# Patient Record
Sex: Male | Born: 1960 | Race: White | Hispanic: No | Marital: Single | Smoking: Former smoker
Health system: Southern US, Community
[De-identification: ages and names within clinical notes are randomized; demographics above are authoritative.]

## PROBLEM LIST (undated history)

## (undated) DIAGNOSIS — C259 Malignant neoplasm of pancreas, unspecified: Secondary | ICD-10-CM

## (undated) DIAGNOSIS — Q600 Renal agenesis, unilateral: Secondary | ICD-10-CM

## (undated) DIAGNOSIS — Z9981 Dependence on supplemental oxygen: Secondary | ICD-10-CM

## (undated) DIAGNOSIS — G4733 Obstructive sleep apnea (adult) (pediatric): Secondary | ICD-10-CM

## (undated) DIAGNOSIS — M1990 Unspecified osteoarthritis, unspecified site: Secondary | ICD-10-CM

## (undated) DIAGNOSIS — K219 Gastro-esophageal reflux disease without esophagitis: Secondary | ICD-10-CM

## (undated) DIAGNOSIS — J449 Chronic obstructive pulmonary disease, unspecified: Secondary | ICD-10-CM

## (undated) DIAGNOSIS — Z9989 Dependence on other enabling machines and devices: Secondary | ICD-10-CM

## (undated) HISTORY — PX: COLONOSCOPY: SHX174

## (undated) HISTORY — PX: ESOPHAGOGASTRODUODENOSCOPY: SHX1529

---

## 2000-06-15 HISTORY — PX: KNEE ARTHROSCOPY: SUR90

## 2013-06-29 ENCOUNTER — Ambulatory Visit: Payer: Self-pay

## 2015-08-27 DIAGNOSIS — R5383 Other fatigue: Secondary | ICD-10-CM | POA: Insufficient documentation

## 2015-08-27 DIAGNOSIS — R7309 Other abnormal glucose: Secondary | ICD-10-CM

## 2015-08-27 DIAGNOSIS — J449 Chronic obstructive pulmonary disease, unspecified: Secondary | ICD-10-CM

## 2015-08-27 DIAGNOSIS — N401 Enlarged prostate with lower urinary tract symptoms: Secondary | ICD-10-CM | POA: Insufficient documentation

## 2015-08-27 DIAGNOSIS — R5381 Other malaise: Secondary | ICD-10-CM | POA: Insufficient documentation

## 2015-08-27 HISTORY — DX: Other malaise: R53.83

## 2015-08-27 HISTORY — DX: Chronic obstructive pulmonary disease, unspecified: J44.9

## 2015-08-27 HISTORY — DX: Benign prostatic hyperplasia with lower urinary tract symptoms: N40.1

## 2015-08-27 HISTORY — DX: Other malaise: R53.81

## 2015-08-27 HISTORY — DX: Other abnormal glucose: R73.09

## 2015-09-10 DIAGNOSIS — E041 Nontoxic single thyroid nodule: Secondary | ICD-10-CM | POA: Insufficient documentation

## 2015-09-10 DIAGNOSIS — F17200 Nicotine dependence, unspecified, uncomplicated: Secondary | ICD-10-CM | POA: Insufficient documentation

## 2015-09-10 DIAGNOSIS — E7800 Pure hypercholesterolemia, unspecified: Secondary | ICD-10-CM

## 2015-09-10 DIAGNOSIS — R739 Hyperglycemia, unspecified: Secondary | ICD-10-CM | POA: Insufficient documentation

## 2015-09-10 HISTORY — DX: Hyperglycemia, unspecified: R73.9

## 2015-09-10 HISTORY — DX: Pure hypercholesterolemia, unspecified: E78.00

## 2015-09-10 HISTORY — DX: Nontoxic single thyroid nodule: E04.1

## 2017-12-28 DIAGNOSIS — M25562 Pain in left knee: Secondary | ICD-10-CM | POA: Insufficient documentation

## 2017-12-28 HISTORY — DX: Pain in left knee: M25.562

## 2017-12-30 ENCOUNTER — Encounter (HOSPITAL_COMMUNITY): Payer: Self-pay | Admitting: *Deleted

## 2017-12-30 ENCOUNTER — Other Ambulatory Visit: Payer: Self-pay

## 2017-12-31 ENCOUNTER — Ambulatory Visit: Payer: Self-pay | Admitting: Orthopedic Surgery

## 2017-12-31 NOTE — Progress Notes (Signed)
Clearance on chart- 12/28/2017- Dr Venetia Maxon - on chart

## 2017-12-31 NOTE — Progress Notes (Signed)
Need orders in epic.  Surgery on 01/06/2018.  Thank You.

## 2018-01-04 ENCOUNTER — Other Ambulatory Visit: Payer: Self-pay

## 2018-01-04 ENCOUNTER — Encounter (HOSPITAL_BASED_OUTPATIENT_CLINIC_OR_DEPARTMENT_OTHER): Payer: Self-pay | Admitting: *Deleted

## 2018-01-04 NOTE — Progress Notes (Addendum)
Spoke w/ pt via phone for pre-op interview.  Npo after mn w/ exception clear liquids until 0900 (no cream/milk products).  Arrive at 1300.  Will take prilosec and do stiolto and proair inhalers am dos w/ sips of water.  Will review chart w/ anesthesia since pt is prescribed oxygen as needed.  Request pt pulmologist, dr Alcide Clever, [place] note via fax.  Pt pcp clearance faxed via from dr swinteck office w/ chart   ADDENDUM;  Reviewed chart w/ dr rose mda, face to face.  Dr rose stated ok to proceed , have pt do his atrovent nebulizer as prescribed qid and bring cpap.  ADDENDUM:  Called and spoke w/ pt via phone.  Pt verbalized understanding to do atrovent four times daily from now until am dos and bring cpap.  ADDENDUM:  Called and spoke w/ pt via phone.  Pt verbalized understanding his surgery start time is now at 1300 and to arrive dos at 1100.  Also understood clear liquids are from midnight Wednesday until [place] , then nothing by mouth.  Was unable to received pulmologist note , pt was going to have to sign.

## 2018-01-06 ENCOUNTER — Encounter (HOSPITAL_BASED_OUTPATIENT_CLINIC_OR_DEPARTMENT_OTHER)
Admission: RE | Disposition: A | Discharge status: Home / Self Care | Payer: Self-pay | Source: Ambulatory Visit | Attending: Orthopedic Surgery

## 2018-01-06 ENCOUNTER — Ambulatory Visit (HOSPITAL_COMMUNITY): Payer: Medicaid Other | Admitting: Anesthesiology

## 2018-01-06 ENCOUNTER — Ambulatory Visit (HOSPITAL_BASED_OUTPATIENT_CLINIC_OR_DEPARTMENT_OTHER)
Admission: RE | Admit: 2018-01-06 | Discharge: 2018-01-06 | Disposition: A | Discharge status: Home / Self Care | Payer: Medicaid Other | Source: Ambulatory Visit | Attending: Orthopedic Surgery | Admitting: Orthopedic Surgery

## 2018-01-06 ENCOUNTER — Encounter (HOSPITAL_BASED_OUTPATIENT_CLINIC_OR_DEPARTMENT_OTHER): Payer: Self-pay | Admitting: *Deleted

## 2018-01-06 DIAGNOSIS — S83232A Complex tear of medial meniscus, current injury, left knee, initial encounter: Secondary | ICD-10-CM | POA: Diagnosis not present

## 2018-01-06 DIAGNOSIS — Z9981 Dependence on supplemental oxygen: Secondary | ICD-10-CM | POA: Insufficient documentation

## 2018-01-06 DIAGNOSIS — Y92002 Bathroom of unspecified non-institutional (private) residence single-family (private) house as the place of occurrence of the external cause: Secondary | ICD-10-CM | POA: Diagnosis not present

## 2018-01-06 DIAGNOSIS — F17210 Nicotine dependence, cigarettes, uncomplicated: Secondary | ICD-10-CM | POA: Diagnosis not present

## 2018-01-06 DIAGNOSIS — K219 Gastro-esophageal reflux disease without esophagitis: Secondary | ICD-10-CM | POA: Insufficient documentation

## 2018-01-06 DIAGNOSIS — W19XXXA Unspecified fall, initial encounter: Secondary | ICD-10-CM | POA: Insufficient documentation

## 2018-01-06 DIAGNOSIS — Y93E1 Activity, personal bathing and showering: Secondary | ICD-10-CM | POA: Diagnosis not present

## 2018-01-06 DIAGNOSIS — Z8507 Personal history of malignant neoplasm of pancreas: Secondary | ICD-10-CM | POA: Insufficient documentation

## 2018-01-06 DIAGNOSIS — M1712 Unilateral primary osteoarthritis, left knee: Secondary | ICD-10-CM | POA: Insufficient documentation

## 2018-01-06 DIAGNOSIS — Z79899 Other long term (current) drug therapy: Secondary | ICD-10-CM | POA: Insufficient documentation

## 2018-01-06 DIAGNOSIS — Q600 Renal agenesis, unilateral: Secondary | ICD-10-CM | POA: Diagnosis not present

## 2018-01-06 DIAGNOSIS — G4733 Obstructive sleep apnea (adult) (pediatric): Secondary | ICD-10-CM | POA: Insufficient documentation

## 2018-01-06 DIAGNOSIS — X501XXA Overexertion from prolonged static or awkward postures, initial encounter: Secondary | ICD-10-CM | POA: Diagnosis not present

## 2018-01-06 DIAGNOSIS — J449 Chronic obstructive pulmonary disease, unspecified: Secondary | ICD-10-CM | POA: Insufficient documentation

## 2018-01-06 HISTORY — DX: Malignant neoplasm of pancreas, unspecified: C25.9

## 2018-01-06 HISTORY — PX: KNEE ARTHROSCOPY WITH MEDIAL MENISECTOMY: SHX5651

## 2018-01-06 HISTORY — DX: Obstructive sleep apnea (adult) (pediatric): G47.33

## 2018-01-06 HISTORY — DX: Unspecified osteoarthritis, unspecified site: M19.90

## 2018-01-06 HISTORY — DX: Dependence on supplemental oxygen: Z99.81

## 2018-01-06 HISTORY — DX: Dependence on other enabling machines and devices: Z99.89

## 2018-01-06 HISTORY — DX: Gastro-esophageal reflux disease without esophagitis: K21.9

## 2018-01-06 HISTORY — DX: Chronic obstructive pulmonary disease, unspecified: J44.9

## 2018-01-06 HISTORY — DX: Renal agenesis, unilateral: Q60.0

## 2018-01-06 SURGERY — ARTHROSCOPY, KNEE, WITH MEDIAL MENISCECTOMY
Anesthesia: General | Site: Knee | Laterality: Left

## 2018-01-06 MED ORDER — ONDANSETRON HCL 4 MG/2ML IJ SOLN
INTRAMUSCULAR | Status: DC | PRN
  Administered 2018-01-06: 4 mg via INTRAVENOUS

## 2018-01-06 MED ORDER — DEXAMETHASONE SODIUM PHOSPHATE 10 MG/ML IJ SOLN
INTRAMUSCULAR | Status: AC
  Filled 2018-01-06: qty 1

## 2018-01-06 MED ORDER — HYDROMORPHONE HCL 1 MG/ML IJ SOLN
0.5000 mg | INTRAMUSCULAR | Status: DC | PRN
  Filled 2018-01-06: qty 1

## 2018-01-06 MED ORDER — CHLORHEXIDINE GLUCONATE 4 % EX LIQD
60.0000 mL | Freq: Once | CUTANEOUS | Status: DC
  Filled 2018-01-06: qty 118

## 2018-01-06 MED ORDER — LIDOCAINE-EPINEPHRINE 1 %-1:100000 IJ SOLN
INTRAMUSCULAR | Status: AC
  Filled 2018-01-06: qty 1

## 2018-01-06 MED ORDER — PROPOFOL 10 MG/ML IV BOLUS
INTRAVENOUS | Status: DC | PRN
  Administered 2018-01-06 (×2): 20 mg via INTRAVENOUS
  Administered 2018-01-06: 140 mg via INTRAVENOUS

## 2018-01-06 MED ORDER — BUPIVACAINE-EPINEPHRINE (PF) 0.25% -1:200000 IJ SOLN
INTRAMUSCULAR | Status: AC
  Filled 2018-01-06: qty 30

## 2018-01-06 MED ORDER — HYDROCODONE-ACETAMINOPHEN 5-325 MG PO TABS: 1.0000 | ORAL_TABLET | Freq: Four times a day (QID) | ORAL | 0 refills | Status: DC | PRN

## 2018-01-06 MED ORDER — FENTANYL CITRATE (PF) 100 MCG/2ML IJ SOLN
INTRAMUSCULAR | Status: AC
  Filled 2018-01-06: qty 2

## 2018-01-06 MED ORDER — DEXAMETHASONE SODIUM PHOSPHATE 10 MG/ML IJ SOLN
INTRAMUSCULAR | Status: DC | PRN
  Administered 2018-01-06: 10 mg via INTRAVENOUS

## 2018-01-06 MED ORDER — ASPIRIN EC 325 MG PO TBEC: 325.0000 mg | DELAYED_RELEASE_TABLET | Freq: Two times a day (BID) | ORAL | 0 refills | Status: DC

## 2018-01-06 MED ORDER — ONDANSETRON HCL 4 MG PO TABS: 4.0000 mg | ORAL_TABLET | Freq: Three times a day (TID) | ORAL | 0 refills | Status: DC | PRN

## 2018-01-06 MED ORDER — DEXTROSE 5 % IV SOLN
3.0000 g | INTRAVENOUS | Status: AC
  Administered 2018-01-06: 3 g via INTRAVENOUS
  Filled 2018-01-06 (×2): qty 3000

## 2018-01-06 MED ORDER — OXYCODONE HCL 5 MG PO TABS
5.0000 mg | ORAL_TABLET | ORAL | Status: DC | PRN
  Filled 2018-01-06: qty 2

## 2018-01-06 MED ORDER — FENTANYL CITRATE (PF) 100 MCG/2ML IJ SOLN
INTRAMUSCULAR | Status: DC | PRN
  Administered 2018-01-06: 25 ug via INTRAVENOUS
  Administered 2018-01-06: 50 ug via INTRAVENOUS
  Administered 2018-01-06 (×3): 25 ug via INTRAVENOUS
  Administered 2018-01-06: 50 ug via INTRAVENOUS

## 2018-01-06 MED ORDER — LACTATED RINGERS IV SOLN
INTRAVENOUS | Status: DC | PRN
  Administered 2018-01-06: 13:00:00 via INTRAVENOUS

## 2018-01-06 MED ORDER — OXYCODONE HCL 5 MG/5ML PO SOLN
5.0000 mg | Freq: Once | ORAL | Status: AC | PRN
  Filled 2018-01-06: qty 5

## 2018-01-06 MED ORDER — SODIUM CHLORIDE 0.9 % IV SOLN
INTRAVENOUS | Status: DC
  Filled 2018-01-06: qty 1000

## 2018-01-06 MED ORDER — METOCLOPRAMIDE HCL 5 MG PO TABS
5.0000 mg | ORAL_TABLET | Freq: Three times a day (TID) | ORAL | Status: DC | PRN
  Filled 2018-01-06: qty 2

## 2018-01-06 MED ORDER — SODIUM CHLORIDE 0.9 % IR SOLN
Status: DC | PRN
  Administered 2018-01-06: 6000 mL

## 2018-01-06 MED ORDER — MIDAZOLAM HCL 2 MG/2ML IJ SOLN
1.0000 mg | Freq: Once | INTRAMUSCULAR | Status: AC
Start: 2018-01-06 — End: 2018-01-06
  Administered 2018-01-06: 1 mg via INTRAVENOUS
  Filled 2018-01-06: qty 1

## 2018-01-06 MED ORDER — METHOCARBAMOL 500 MG PO TABS
500.0000 mg | ORAL_TABLET | Freq: Four times a day (QID) | ORAL | Status: DC | PRN
  Filled 2018-01-06: qty 1

## 2018-01-06 MED ORDER — SODIUM CHLORIDE 0.9 % IV SOLN
INTRAVENOUS | Status: DC
  Administered 2018-01-06: 13:00:00 via INTRAVENOUS
  Filled 2018-01-06: qty 1000

## 2018-01-06 MED ORDER — LIDOCAINE-EPINEPHRINE 1 %-1:100000 IJ SOLN
INTRAMUSCULAR | Status: DC | PRN
  Administered 2018-01-06: 20 mL

## 2018-01-06 MED ORDER — OXYCODONE HCL 5 MG PO TABS
5.0000 mg | ORAL_TABLET | Freq: Once | ORAL | Status: AC | PRN
  Administered 2018-01-06: 5 mg via ORAL
  Filled 2018-01-06: qty 1

## 2018-01-06 MED ORDER — OXYCODONE HCL 5 MG PO TABS
ORAL_TABLET | ORAL | Status: AC
  Filled 2018-01-06: qty 1

## 2018-01-06 MED ORDER — ONDANSETRON HCL 4 MG/2ML IJ SOLN
4.0000 mg | Freq: Four times a day (QID) | INTRAMUSCULAR | Status: DC | PRN
  Filled 2018-01-06: qty 2

## 2018-01-06 MED ORDER — ONDANSETRON HCL 4 MG PO TABS
4.0000 mg | ORAL_TABLET | Freq: Four times a day (QID) | ORAL | Status: DC | PRN
  Filled 2018-01-06: qty 1

## 2018-01-06 MED ORDER — FENTANYL CITRATE (PF) 100 MCG/2ML IJ SOLN
25.0000 ug | INTRAMUSCULAR | Status: DC | PRN
  Filled 2018-01-06: qty 1

## 2018-01-06 MED ORDER — ONDANSETRON HCL 4 MG/2ML IJ SOLN
4.0000 mg | Freq: Four times a day (QID) | INTRAMUSCULAR | Status: DC | PRN
Start: 2018-01-06 — End: 2018-01-06
  Filled 2018-01-06: qty 2

## 2018-01-06 MED ORDER — LIDOCAINE HCL (CARDIAC) PF 100 MG/5ML IV SOSY
PREFILLED_SYRINGE | INTRAVENOUS | Status: DC | PRN
  Administered 2018-01-06: 80 mg via INTRAVENOUS

## 2018-01-06 MED ORDER — MIDAZOLAM HCL 2 MG/2ML IJ SOLN
INTRAMUSCULAR | Status: AC
  Filled 2018-01-06: qty 2

## 2018-01-06 MED ORDER — DOCUSATE SODIUM 100 MG PO CAPS: 100.0000 mg | ORAL_CAPSULE | Freq: Two times a day (BID) | ORAL | 3 refills | Status: DC

## 2018-01-06 MED ORDER — LIDOCAINE 2% (20 MG/ML) 5 ML SYRINGE
INTRAMUSCULAR | Status: AC
  Filled 2018-01-06: qty 5

## 2018-01-06 MED ORDER — MIDAZOLAM HCL 2 MG/2ML IJ SOLN
INTRAMUSCULAR | Status: DC | PRN
  Administered 2018-01-06: 2 mg via INTRAVENOUS

## 2018-01-06 MED ORDER — METHOCARBAMOL 1000 MG/10ML IJ SOLN
500.0000 mg | Freq: Four times a day (QID) | INTRAVENOUS | Status: DC | PRN
  Filled 2018-01-06: qty 5

## 2018-01-06 MED ORDER — PROPOFOL 10 MG/ML IV BOLUS
INTRAVENOUS | Status: AC
  Filled 2018-01-06: qty 20

## 2018-01-06 MED ORDER — BUPIVACAINE-EPINEPHRINE 0.25% -1:200000 IJ SOLN
INTRAMUSCULAR | Status: DC | PRN
  Administered 2018-01-06: 30 mL

## 2018-01-06 MED ORDER — STERILE WATER FOR IRRIGATION IR SOLN
Status: DC | PRN
  Administered 2018-01-06: 500 mL

## 2018-01-06 MED ORDER — METOCLOPRAMIDE HCL 5 MG/ML IJ SOLN
5.0000 mg | Freq: Three times a day (TID) | INTRAMUSCULAR | Status: DC | PRN
  Filled 2018-01-06: qty 2

## 2018-01-06 MED ORDER — SENNA 8.6 MG PO TABS: 2.0000 | ORAL_TABLET | Freq: Every day | ORAL | 3 refills | Status: DC

## 2018-01-06 MED ORDER — ONDANSETRON HCL 4 MG/2ML IJ SOLN
INTRAMUSCULAR | Status: AC
  Filled 2018-01-06: qty 2

## 2018-01-06 MED ORDER — POVIDONE-IODINE 10 % EX SWAB
2.0000 "application " | Freq: Once | CUTANEOUS | Status: DC
  Filled 2018-01-06: qty 2

## 2018-01-06 SURGICAL SUPPLY — 51 items
BANDAGE ACE 4X5 VEL STRL LF (GAUZE/BANDAGES/DRESSINGS) ×3 IMPLANT
BANDAGE ACE 6X5 VEL STRL LF (GAUZE/BANDAGES/DRESSINGS) ×3 IMPLANT
BANDAGE ELASTIC 6 VELCRO ST LF (GAUZE/BANDAGES/DRESSINGS) ×3 IMPLANT
BLADE CUDA 4.2 (BLADE) IMPLANT
BLADE CUDA SHAVER 3.5 (BLADE) ×3 IMPLANT
BLADE GREAT WHITE 4.2 (BLADE) IMPLANT
BLADE GREAT WHITE 4.2MM (BLADE)
BLADE GREAT WHITE SHAVER 5.5 (BLADE) IMPLANT
BLADE GREAT WHITE SHAVER 5.5MM (BLADE)
BNDG COHESIVE 6X5 TAN NS LF (GAUZE/BANDAGES/DRESSINGS) IMPLANT
CHLORAPREP W/TINT 26ML (MISCELLANEOUS) ×3 IMPLANT
CUFF TOURNIQUET SINGLE 34IN LL (TOURNIQUET CUFF) ×3 IMPLANT
DRAPE ARTHROSCOPY W/POUCH 114 (DRAPES) ×3 IMPLANT
DRAPE U-SHAPE 47X51 STRL (DRAPES) ×3 IMPLANT
DRSG EMULSION OIL 3X3 NADH (GAUZE/BANDAGES/DRESSINGS) ×3 IMPLANT
DRSG PAD ABDOMINAL 8X10 ST (GAUZE/BANDAGES/DRESSINGS) ×3 IMPLANT
GAUZE SPONGE 4X4 12PLY STRL (GAUZE/BANDAGES/DRESSINGS) ×3 IMPLANT
GAUZE SPONGE 4X4 12PLY STRL LF (GAUZE/BANDAGES/DRESSINGS) ×3 IMPLANT
GLOVE BIO SURGEON STRL SZ 6.5 (GLOVE) ×2 IMPLANT
GLOVE BIO SURGEON STRL SZ7 (GLOVE) ×3 IMPLANT
GLOVE BIO SURGEON STRL SZ8.5 (GLOVE) ×3 IMPLANT
GLOVE BIO SURGEONS STRL SZ 6.5 (GLOVE) ×1
GLOVE BIOGEL PI IND STRL 7.0 (GLOVE) ×1 IMPLANT
GLOVE BIOGEL PI INDICATOR 7.0 (GLOVE) ×2
GLOVE INDICATOR 8.5 STRL (GLOVE) ×3 IMPLANT
GOWN SPEC L3 XXLG W/TWL (GOWN DISPOSABLE) ×3 IMPLANT
GOWN STRL REUS W/ TWL LRG LVL3 (GOWN DISPOSABLE) ×1 IMPLANT
GOWN STRL REUS W/TWL 2XL LVL3 (GOWN DISPOSABLE) IMPLANT
GOWN STRL REUS W/TWL LRG LVL3 (GOWN DISPOSABLE) ×2
HOLDER KNEE FOAM BLUE (MISCELLANEOUS) IMPLANT
IV NS IRRIG 3000ML ARTHROMATIC (IV SOLUTION) ×6 IMPLANT
KIT TURNOVER CYSTO (KITS) ×3 IMPLANT
KNEE WRAP E Z 3 GEL PACK (MISCELLANEOUS) ×3 IMPLANT
MANIFOLD NEPTUNE II (INSTRUMENTS) ×3 IMPLANT
NDL SAFETY ECLIPSE 18X1.5 (NEEDLE) ×1 IMPLANT
NEEDLE HYPO 18GX1.5 SHARP (NEEDLE) ×2
PACK ARTHROSCOPY DSU (CUSTOM PROCEDURE TRAY) ×3 IMPLANT
PACK BASIN DAY SURGERY FS (CUSTOM PROCEDURE TRAY) ×3 IMPLANT
PADDING CAST COTTON 6X4 STRL (CAST SUPPLIES) ×3 IMPLANT
PROBE BIPOLAR 50 DEGREE SUCT (MISCELLANEOUS) IMPLANT
PROBE BIPOLAR ATHRO 135MM 90D (MISCELLANEOUS) IMPLANT
SET ARTHROSCOPY TUBING (MISCELLANEOUS) ×2
SET ARTHROSCOPY TUBING LN (MISCELLANEOUS) ×1 IMPLANT
SUT ETHILON 3 0 PS 1 (SUTURE) ×3 IMPLANT
SUT ETHILON 4 0 PS 2 18 (SUTURE) IMPLANT
SYR 30ML LL (SYRINGE) ×3 IMPLANT
TOWEL OR 17X24 6PK STRL BLUE (TOWEL DISPOSABLE) ×6 IMPLANT
TUBE CONNECTING 12'X1/4 (SUCTIONS) ×2
TUBE CONNECTING 12X1/4 (SUCTIONS) ×4 IMPLANT
WAND 30 DEG SABER W/CORD (SURGICAL WAND) IMPLANT
WATER STERILE IRR 500ML POUR (IV SOLUTION) ×3 IMPLANT

## 2018-01-06 NOTE — Anesthesia Preprocedure Evaluation (Signed)
Anesthesia Evaluation  Patient identified by MRN, date of birth, ID band Patient awake    Reviewed: Allergy & Precautions, H&P , NPO status , Patient's Chart, lab work & pertinent test results  Airway Mallampati: II   Neck ROM: full    Dental   Pulmonary sleep apnea and Oxygen sleep apnea , COPD, Current Smoker,    breath sounds clear to auscultation       Cardiovascular negative cardio ROS   Rhythm:regular Rate:Normal     Neuro/Psych    GI/Hepatic GERD  ,  Endo/Other  Morbid obesity  Renal/GU      Musculoskeletal  (+) Arthritis ,   Abdominal   Peds  Hematology   Anesthesia Other Findings   Reproductive/Obstetrics                             Anesthesia Physical Anesthesia Plan  ASA: III  Anesthesia Plan: General   Post-op Pain Management:    Induction: Intravenous  PONV Risk Score and Plan: 1 and Ondansetron, Dexamethasone, Midazolam and Treatment may vary due to age or medical condition  Airway Management Planned: LMA  Additional Equipment:   Intra-op Plan:   Post-operative Plan:   Informed Consent: I have reviewed the patients History and Physical, chart, labs and discussed the procedure including the risks, benefits and alternatives for the proposed anesthesia with the patient or authorized representative who has indicated his/her understanding and acceptance.     Plan Discussed with: CRNA, Anesthesiologist and Surgeon  Anesthesia Plan Comments:         Anesthesia Quick Evaluation

## 2018-01-06 NOTE — Op Note (Signed)
Preoperative diagnosis-  Left knee medial meniscal tear  Postoperative diagnosis Left- knee medial meniscal tear plus osteoarthritis  Procedure- Left knee arthroscopy with partial medial meniscectomy and medial compartment chondroplasty  Surgeon- Rod Can, MD  Anesthesia-General  EBL-  Minimal  Complications- None  Condition- PACU - hemodynamically stable.  Brief clinical note- Patient is a 57 year old male who fell in his bathtub and twisted his left knee.  After the injury, he had medial compartment pain and mechanical symptoms.  MRI showed a large medial meniscal tear.  Risks, benefits, and alternatives to arthroscopic evaluation of the knee and partial medial meniscectomy were explained, and he elected to proceed.  Procedure in detail -       After successful administration of General anesthetic, a tourmiquet is placed high on the Left  thigh and the Left lower extremity is prepped and draped in the usual sterile fashion. Time out is performed by the surgical team.  20 cc of 1% lidocaine with epinephrine were injected into the suprapatellar pouch of the knee.  Inferolateral portal site was created with an 11 blade.  Arthroscopic equipment was inserted, and inflow is initiated. Arthroscopic visualization proceeds.      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 The medial and lateral gutters are visualized and there are no loose bodies. Flexion and valgus force is applied to the knee and the medial compartment is entered. A spinal needle is passed into the joint through the site marked for the inferomedial portal. A small incision is made and the dilator passed into the joint. The findings for the medial compartment are diffuse grade II/III chondromalacia changes.  He has a large complex medial meniscal tear at the junction of the posterior horn and body.  He had radial and cleavage components.  The tear was unstable. The tear  is debrided to a stable base with baskets and a shaver. The shaver is used to debride the unstable cartilage to a stable cartilaginous base with stable edges. It is probed and found to be stable.    The intercondylar notch is visualized and the ACL appears normal. The lateral compartment is entered and the findings are intact meniscus with grade II chondromalacia.  The meniscus was probed and found to be stable.     The joint is again inspected and there are no other tears, defects or loose bodies identified. The arthroscopic equipment is then removed and the portals are closed with 3-0 nylon. 20 ml of .25% Marcaine with epinephrine are injected into the suprapatellar pouch of the knee.  The incisions are cleaned and dried and a bulky sterile dressing is applied. The patient is then awakened and transported to recovery in stable condition.   01/06/2018, 2:23 PM

## 2018-01-06 NOTE — H&P (Signed)
PREOPERATIVE H&P  Chief Complaint: Medial meniscal tear left knee  HPI: Taylor Vargas is a 57 y.o. male who presents for preoperative history and physical with a diagnosis of Medial meniscal tear left knee. Symptoms are rated as moderate to severe, and have been worsening.  This is significantly impairing activities of daily living.  He has elected for surgical management.   Past Medical History:  Diagnosis Date  . Arthritis   . Congenital absence of one kidney    01-04-2018 per pt unsure which one  . COPD (chronic obstructive pulmonary disease) ([place])    pulmologist-  dr Taylor Vargas)  . GERD (gastroesophageal reflux disease)   . On supplemental oxygen therapy    01-04-2018  per pt prescribed as needed ,  states checks oxygen level daily,  and average between 88-90% on room air,  per pt has not needed O2 past 4 months  . OSA on CPAP   . Pancreatic cancer ([place])    01-04-2018  per pt dx 10/ 2009 found by CT imaging, no surgery,  treatment chemotherapy  @ [facility],  in remission since 2017   Past Surgical History:  Procedure Laterality Date  . COLONOSCOPY  last one 2019  . KNEE ARTHROSCOPY Right 2002   Social History   Socioeconomic History  . Marital status: Single    Spouse name: Not on file  . Number of children: Not on file  . Years of education: Not on file  . Highest education level: Not on file  Occupational History  . Not on file  Social Needs  . Financial resource strain: Not on file  . Food insecurity:    Worry: Not on file    Inability: Not on file  . Transportation needs:    Medical: Not on file    Non-medical: Not on file  Tobacco Use  . Smoking status: Current Every Day Smoker    Years: 31.00    Types: Cigarettes  . Smokeless tobacco: Never Used  . Tobacco comment: 01-04-2018  per pt down from 1/2 ppd down to 3-4 cig daily  Substance and Sexual Activity  . Alcohol use: Yes    Frequency: Never    Comment: very rare  . Drug use: Never  . Sexual activity:  Not on file  Lifestyle  . Physical activity:    Days per week: Not on file    Minutes per session: Not on file  . Stress: Not on file  Relationships  . Social connections:    Talks on phone: Not on file    Gets together: Not on file    Attends religious service: Not on file    Active member of club or organization: Not on file    Attends meetings of clubs or organizations: Not on file    Relationship status: Not on file  Other Topics Concern  . Not on file  Social History Narrative  . Not on file   History reviewed. No pertinent family history. No Known Allergies Prior to Admission medications   Medication Sig Start Date End Date Taking? Authorizing Provider  albuterol (ACCUNEB) 0.63 MG/3ML nebulizer solution Take 1 ampule by nebulization every 6 (six) hours as needed for wheezing.   Yes [provider]  Albuterol Sulfate (PROAIR HFA IN) Inhale into the lungs.   Yes [provider]  Flaxseed, Linseed, (FLAX SEED OIL) 1000 MG CAPS Take by mouth daily.   Yes [provider]  ibuprofen (ADVIL,MOTRIN) 200 MG tablet Take 200 mg  by mouth every 6 (six) hours as needed.   Yes [provider]  ipratropium (ATROVENT) 0.02 % nebulizer solution Take 0.5 mg by nebulization 4 (four) times daily. Per pt only takes as needed   Yes [provider]  omeprazole (PRILOSEC) 40 MG capsule Take 40 mg by mouth every morning.    Yes [provider]  Tiotropium Bromide-Olodaterol (STIOLTO RESPIMAT) 2.5-2.5 MCG/ACT AERS Inhale into the lungs every morning.    Yes [provider]  vitamin C (ASCORBIC ACID) 500 MG tablet Take 500 mg by mouth daily.   Yes [provider]     Positive ROS: All other systems have been reviewed and were otherwise negative with the exception of those mentioned in the HPI and as above.  Physical Exam: General: Vargas, no acute distress Cardiovascular: No pedal edema Respiratory: No cyanosis, no use of  accessory musculature GI: No organomegaly, abdomen is soft and non-tender Skin: No lesions in the area of chief complaint Neurologic: Sensation intact distally Psychiatric: Patient is competent for consent with normal mood and affect Lymphatic: No axillary or cervical lymphadenopathy  MUSCULOSKELETAL: L knee swollen. + TTP medial joint line. + [place]. NVI.  Assessment: Medial meniscal tear left knee  Plan: Plan for Procedure(s): LEFT KNEE ARTHROSCOPY WITH MEDIAL MENISECTOMY  The risks benefits and alternatives were discussed with the patient including but not limited to the risks of nonoperative treatment, versus surgical intervention including infection, bleeding, nerve injury,  blood clots, cardiopulmonary complications, morbidity, mortality, among others, and they were willing to proceed.   Bertram Savin, MD Cell [phone]   01/06/2018 12:29 PM

## 2018-01-06 NOTE — Discharge Instructions (Signed)
° °Dr. Brian Swinteck °Hip & Knee Reconstruction °[facility] °[address] °[place], [place] [address] °[phone] ° ° °Arthroscopic Procedure, Knee °An arthroscopic procedure can find what is wrong with your knee. °PROCEDURE °Arthroscopy is a surgical technique that allows your orthopedic surgeon to diagnose and treat your knee injury with accuracy. They will look into your knee through a small instrument. This is almost like a small (pencil sized) telescope. Because arthroscopy affects your knee less than open knee surgery, you can anticipate a more rapid recovery. Taking an active role by following your caregiver's instructions will help with rapid and complete recovery. Use crutches, rest, elevation, ice, and knee exercises as instructed. The length of recovery depends on various factors including type of injury, age, physical condition, medical conditions, and your rehabilitation. °Your knee is the joint between the large bones (femur and tibia) in your leg. Cartilage covers these bone ends which are smooth and slippery and allow your knee to bend and move smoothly. Two menisci, thick, semi-lunar shaped pads of cartilage which form a rim inside the joint, help absorb shock and stabilize your knee. Ligaments bind the bones together and support your knee joint. Muscles move the joint, help support your knee, and take stress off the joint itself. Because of this all programs and physical therapy to rehabilitate an injured or repaired knee require rebuilding and strengthening your muscles. °AFTER THE PROCEDURE °· After the procedure, you will be moved to a recovery area until most of the effects of the medication have worn off. Your caregiver will discuss the test results with you.  °· Only take over-the-counter or prescription medicines for pain, discomfort, or fever as directed by your caregiver.  °SEEK MEDICAL CARE IF:  °· You have increased bleeding from your wounds.  °· You see  redness, swelling, or have increasing pain in your wounds.  °· You have pus coming from your wound.  °· You have an oral temperature above 102° F (38.9° C).  °· You notice a bad smell coming from the wound or dressing.  °· You have severe pain with any motion of your knee.  °SEEK IMMEDIATE MEDICAL CARE IF:  °· You develop a rash.  °· You have difficulty breathing.  °· You have any allergic problems.  °FURTHER INSTRUCTIONS:  °· ICE to the affected knee every three hours for 30 minutes at a time and then as needed for pain and swelling.  Continue to use ice on the knee for pain and swelling from surgery. You may notice swelling that will progress down to the foot and ankle.  This is normal after surgery.  Elevate the leg when you are not up walking on it.   ° °DIET °You may resume your previous home diet once your are discharged from the hospital. ° °DRESSING / WOUND CARE / SHOWERING °You may change your dressing 3-5 days after surgery.  Then change the dressing every day with sterile gauze.  Please use good hand washing techniques before changing the dressing.  Do not use any lotions or creams on the incision until instructed by your surgeon. °You may start showering two days after being discharged home but do not submerge the incisions under water.  °Change dressing 48 hours after the procedure and then cover the small incisions with band aids until your follow up visit. °Change the surgical dressings daily and reapply a dry dressing each time.  ° °ACTIVITY °Walk with your walker as instructed. °Use walker as long   as suggested by your caregivers. °Avoid periods of inactivity such as sitting longer than an hour when not asleep. This helps prevent blood clots.  °You may resume a sexual relationship in one month or when given the OK by your doctor.  °You may return to work once you are cleared by your doctor.  °Do not drive a car for 6 weeks or until released by you surgeon.  °Do not drive while taking  narcotics. ° °WEIGHT BEARING °Weight bearing as tolerated with assist device (walker, cane, etc) as directed, use it as long as suggested by your surgeon or therapist, typically at least 4-6 weeks. ° °POSTOPERATIVE CONSTIPATION PROTOCOL °Constipation - defined medically as fewer than three stools per week and severe constipation as less than one stool per week. ° °One of the most common issues patients have following surgery is constipation.  Even if you have a regular bowel pattern at home, your normal regimen is likely to be disrupted due to multiple reasons following surgery.  Combination of anesthesia, postoperative narcotics, change in appetite and fluid intake all can affect your bowels.  In order to avoid complications following surgery, here are some recommendations in order to help you during your recovery period. ° °Colace (docusate) - Pick up an over-the-counter form of Colace or another stool softener and take twice a day as long as you are requiring postoperative pain medications.  Take with a full glass of water daily.  If you experience loose stools or diarrhea, hold the colace until you stool forms back up.  If your symptoms do not get better within 1 week or if they get worse, check with your doctor. ° °Dulcolax (bisacodyl) - Pick up over-the-counter and take as directed by the product packaging as needed to assist with the movement of your bowels.  Take with a full glass of water.  Use this product as needed if not relieved by Colace only.  ° °MiraLax (polyethylene glycol) - Pick up over-the-counter to have on hand.  MiraLax is a solution that will increase the amount of water in your bowels to assist with bowel movements.  Take as directed and can mix with a glass of water, juice, soda, coffee, or tea.  Take if you go more than two days without a movement. °Do not use MiraLax more than once per day. Call your doctor if you are still constipated or irregular after using this medication for 7 days  in a row. ° °If you continue to have problems with postoperative constipation, please contact the office for further assistance and recommendations.  If you experience "the worst abdominal pain ever" or develop nausea or vomiting, please contact the office immediatly for further recommendations for treatment. ° °ITCHING ° If you experience itching with your medications, try taking only a single pain pill, or even half a pain pill at a time.  You can also use Benadryl over the counter for itching or also to help with sleep.  ° °TED HOSE STOCKINGS °Wear the elastic stockings on both legs for three weeks following surgery during the day but you may remove then at night for sleeping. ° °MEDICATIONS °See your medication summary on the “After Visit Summary” that the nursing staff will review with you prior to discharge.  You may have some home medications which will be placed on hold until you complete the course of blood thinner medication.  It is important for you to complete the blood thinner medication as prescribed by your surgeon.  Continue   your approved medications as instructed at time of discharge. Do not drive while taking narcotics.   PRECAUTIONS If you experience chest pain or shortness of breath - call 911 immediately for transfer to the hospital emergency department.  If you develop a fever greater that 101 F, purulent drainage from wound, increased redness or drainage from wound, foul odor from the wound/dressing, or calf pain - CONTACT YOUR SURGEON.                                                   FOLLOW-UP APPOINTMENTS Make sure you keep all of your appointments after your operation with your surgeon and caregivers. You should call the office at [phone]  and make an appointment for approximately one week after the date of your surgery or on the date instructed by your surgeon outlined in the "After Visit Summary".  RANGE OF MOTION AND STRENGTHENING EXERCISES  Rehabilitation of the knee  is important following a knee injury or an operation. After just a few days of immobilization, the muscles of the thigh which control the knee become weakened and shrink (atrophy). Knee exercises are designed to build up the tone and strength of the thigh muscles and to improve knee motion. Often times heat used for twenty to thirty minutes before working out will loosen up your tissues and help with improving the range of motion but do not use heat for the first two weeks following surgery. These exercises can be done on a training (exercise) mat, on the floor, on a table or on a bed. Use what ever works the best and is most comfortable for you Knee exercises include:  QUAD STRENGTHENING EXERCISES Strengthening Quadriceps Sets  Tighten muscles on top of thigh by pushing knees down into floor or table. Hold for 20 seconds. Repeat 10 times. Do 2 sessions per day.     Strengthening Terminal Knee Extension  With knee bent over bolster, straighten knee by tightening muscle on top of thigh. Be sure to keep bottom of knee on bolster. Hold for 20 seconds. Repeat 10 times. Do 2 sessions per day.   Straight Leg with Bent Knee  Lie on back with opposite leg bent. Keep involved knee slightly bent at knee and raise leg 4-6". Hold for 10 seconds. Repeat 20 times per set. Do 2 sets per session. Do 2 sessions per day.   Post Anesthesia Home Care Instructions  Activity: Get plenty of rest for the remainder of the day. A responsible individual must stay with you for 24 hours following the procedure.  For the next 24 hours, DO NOT: -Drive a car -Paediatric nurse -Drink alcoholic beverages -Take any medication unless instructed by your physician -Make any legal decisions or sign important papers.  Meals: Start with liquid foods such as gelatin or soup. Progress to regular foods as tolerated. Avoid greasy, spicy, heavy foods. If nausea and/or vomiting occur, drink only clear liquids until the  nausea and/or vomiting subsides. Call your physician if vomiting continues.  Special Instructions/Symptoms: Your throat may feel dry or sore from the anesthesia or the breathing tube placed in your throat during surgery. If this causes discomfort, gargle with warm salt water. The discomfort should disappear within 24 hours.

## 2018-01-06 NOTE — Transfer of Care (Signed)
Immediate Anesthesia Transfer of Care Note  Patient: Taylor Vargas  Procedure(s) Performed: LEFT KNEE ARTHROSCOPY WITH MEDIAL MENISECTOMY (Left Knee)  Patient Location: PACU  Anesthesia Type:General  Level of Consciousness: awake, alert , oriented and patient cooperative  Airway & Oxygen Therapy: Patient Spontanous Breathing and Patient connected to nasal cannula oxygen  Post-op Assessment: Report given to RN and Post -op Vital signs reviewed and stable  Post vital signs: Reviewed and stable  Last Vitals:  Vitals Value Taken Time  BP    Temp    Pulse    Resp    SpO2      Last Pain:  Vitals:   01/06/18 1049  TempSrc: Oral         Complications: No apparent anesthesia complications

## 2018-01-06 NOTE — Anesthesia Procedure Notes (Signed)
Procedure Name: LMA Insertion Date/Time: 01/06/2018 1:08 PM Performed by: Raenette Rover, CRNA Pre-anesthesia Checklist: Patient identified, Emergency Drugs available, Suction available and Patient being monitored Patient Re-evaluated:Patient Re-evaluated prior to induction Oxygen Delivery Method: Circle system utilized Preoxygenation: Pre-oxygenation with 100% oxygen Induction Type: IV induction LMA: LMA inserted LMA Size: 4.0 Number of attempts: 1 Placement Confirmation: positive ETCO2,  CO2 detector and breath sounds checked- equal and bilateral Tube secured with: Tape Dental Injury: Teeth and Oropharynx as per pre-operative assessment

## 2018-01-06 NOTE — Anesthesia Postprocedure Evaluation (Signed)
Anesthesia Post Note  Patient: Taylor Vargas  Procedure(s) Performed: LEFT KNEE ARTHROSCOPY WITH MEDIAL MENISECTOMY (Left Knee)     Patient location during evaluation: PACU Anesthesia Type: General Level of consciousness: awake and alert Pain management: pain level controlled Vital Signs Assessment: post-procedure vital signs reviewed and stable Respiratory status: spontaneous breathing, nonlabored ventilation, respiratory function stable and patient connected to nasal cannula oxygen Cardiovascular status: blood pressure returned to baseline and stable Postop Assessment: no apparent nausea or vomiting Anesthetic complications: no    Last Vitals:  Vitals:   01/06/18 1445 01/06/18 1500  BP: 134/88 (!) 136/98  Pulse: 72 67  Resp: 20 16  Temp:    SpO2: 100% 100%    Last Pain:  Vitals:   01/06/18 1500  TempSrc:   PainSc: 6     LLE Motor Response: Purposeful movement (01/06/18 1500) LLE Sensation: Full sensation (01/06/18 1500)          Asti Mackley A.

## 2018-01-07 ENCOUNTER — Encounter (HOSPITAL_BASED_OUTPATIENT_CLINIC_OR_DEPARTMENT_OTHER): Payer: Self-pay | Admitting: Orthopedic Surgery

## 2018-10-19 ENCOUNTER — Other Ambulatory Visit (HOSPITAL_COMMUNITY): Payer: Self-pay | Admitting: Orthopedic Surgery

## 2018-10-19 DIAGNOSIS — M79606 Pain in leg, unspecified: Secondary | ICD-10-CM

## 2018-10-20 ENCOUNTER — Inpatient Hospital Stay (HOSPITAL_COMMUNITY): Admission: RE | Admit: 2018-10-20 | Payer: Medicaid Other | Source: Ambulatory Visit

## 2018-10-25 ENCOUNTER — Encounter (HOSPITAL_COMMUNITY): Payer: Self-pay

## 2018-10-26 ENCOUNTER — Encounter: Payer: Self-pay | Admitting: Specialist

## 2018-11-02 ENCOUNTER — Other Ambulatory Visit: Payer: Self-pay

## 2018-11-02 ENCOUNTER — Encounter: Payer: Self-pay | Admitting: Cardiology

## 2018-11-02 ENCOUNTER — Telehealth: Payer: Self-pay

## 2018-11-02 ENCOUNTER — Telehealth (INDEPENDENT_AMBULATORY_CARE_PROVIDER_SITE_OTHER): Payer: Medicaid Other | Admitting: Cardiology

## 2018-11-02 VITALS — Ht 73.0 in | Wt 325.0 lb

## 2018-11-02 DIAGNOSIS — E8770 Fluid overload, unspecified: Secondary | ICD-10-CM | POA: Diagnosis not present

## 2018-11-02 DIAGNOSIS — F17210 Nicotine dependence, cigarettes, uncomplicated: Secondary | ICD-10-CM

## 2018-11-02 DIAGNOSIS — R0609 Other forms of dyspnea: Secondary | ICD-10-CM

## 2018-11-02 DIAGNOSIS — E7800 Pure hypercholesterolemia, unspecified: Secondary | ICD-10-CM

## 2018-11-02 DIAGNOSIS — J431 Panlobular emphysema: Secondary | ICD-10-CM

## 2018-11-02 DIAGNOSIS — R601 Generalized edema: Secondary | ICD-10-CM

## 2018-11-02 HISTORY — DX: Morbid (severe) obesity due to excess calories: E66.01

## 2018-11-02 NOTE — Progress Notes (Signed)
Virtual Visit via Video Note   This visit type was conducted due to national recommendations for restrictions regarding the COVID-19 Pandemic (e.g. social distancing) in an effort to limit this patient's exposure and mitigate transmission in our community.  Due to his co-morbid illnesses, this patient is at least at moderate risk for complications without adequate follow up.  This format is felt to be most appropriate for this patient at this time.  All issues noted in this document were discussed and addressed.  A limited physical exam was performed with this format.  Please refer to the patient's chart for his consent to telehealth for Taylor Vargas.   Date:  11/02/2018   ID:  Taylor Vargas, DOB [birth year], MRN [id number]  Patient Location: Home Provider Location: Home  PCP:  Street, Sharon Mt, MD  Cardiologist:  No primary care provider on file.  Electrophysiologist:  None   Evaluation Performed:  Consultation - Tegan Burnside was referred by DR chodri for the evaluation of shortness of breath and some pedal edema.  Chief Complaint: Shortness of breath  History of Present Illness:    Taylor Vargas is a 58 y.o. male with past medical history of COPD.  He has been a heavy smoker in the past.  He is morbidly obese and has sleep apnea.  He has been evaluated for shortness of breath.  This is chronic in nature.  He also has pedal edema he says which is significant and therefore he was evaluated.  At the time of my evaluation, the patient is alert awake oriented and in no distress.  No chest pain orthopnea or PND.  The patient does not have symptoms concerning for COVID-19 infection (fever, chills, cough, or new shortness of breath).    Past Medical History:  Diagnosis Date  . Arthritis   . Congenital absence of one kidney    01-04-2018 per pt unsure which one  . COPD (chronic obstructive pulmonary disease) ([place])    pulmologist-  dr Alcide Clever [name])  . GERD (gastroesophageal reflux disease)    . On supplemental oxygen therapy    01-04-2018  per pt prescribed as needed ,  states checks oxygen level daily,  and average between 88-90% on room air,  per pt has not needed O2 past 4 months  . OSA on CPAP   . Pancreatic cancer ([place])    01-04-2018  per pt dx 10/ 2009 found by CT imaging, no surgery,  treatment chemotherapy  @ [facility],  in remission since 2017   Past Surgical History:  Procedure Laterality Date  . COLONOSCOPY  last one 2019  . KNEE ARTHROSCOPY Right 2002  . KNEE ARTHROSCOPY WITH MEDIAL MENISECTOMY Left 01/06/2018   Procedure: LEFT KNEE ARTHROSCOPY WITH MEDIAL MENISECTOMY;  Surgeon: Rod Can, MD;  Location: [facility];  Service: Orthopedics;  Laterality: Left;     Current Meds  Medication Sig  . albuterol (ACCUNEB) 0.63 MG/3ML nebulizer solution Take 1 ampule by nebulization every 6 (six) hours as needed for wheezing.  . Albuterol Sulfate (PROAIR HFA IN) Inhale into the lungs.     Allergies:   Patient has no known allergies.   Social History   Tobacco Use  . Smoking status: Current Every Day Smoker    Years: 31.00    Types: Cigarettes  . Smokeless tobacco: Never Used  . Tobacco comment: 01-04-2018  per pt down from 1/2 ppd down to 3-4 cig daily  Substance Use Topics  . Alcohol use: Yes  Frequency: Never    Comment: very rare  . Drug use: Never     Family Hx: The patient's family history is not on file.  ROS:   Please see the history of present illness.    As mentioned above All other systems reviewed and are negative.   Prior CV studies:   The following studies were reviewed today:  I did extensive records review as sent by primary care physician  Labs/Other Tests and Data Reviewed:    EKG:  No ECG reviewed.  Recent Labs: No results found for requested labs within last 8760 hours.   Recent Lipid Panel No results found for: CHOL, TRIG, HDL, CHOLHDL, LDLCALC, LDLDIRECT  Wt Readings from Last 3 Encounters:  11/02/18  (!) 325 lb (147.4 kg)  01/06/18 293 lb 8 oz (133.1 kg)     Objective:    Vital Signs:  Ht 6\' 1"  (1.854 m)   Wt (!) 325 lb (147.4 kg)   BMI 42.88 kg/m    VITAL SIGNS:  reviewed  ASSESSMENT & PLAN:    1. Shortness of breath: The patient has advanced COPD and is evaluated by pulmonologist for this reason.  We will do an echocardiogram to understand the cardiac anatomy especially valvular heart disease.  We will also be getting help in assessing the right heart in view of significant pulmonary pathology.  He also has sleep apnea and must be urged to use his sleep apnea machine on a regular basis.  I am sure this is done by his pulmonologist as his pulmonologist is also involved in a sleep study evaluation. 2. I reviewed his lab work.  In view of the fact that his renal function is fine I will initiate him on Zaroxolyn 2.5 mg daily and we will reassess him on Tuesday.  He knows to go to the nearest emergency room for any significant concerns.  He will not take his Lasix beginning tomorrow and initiate Zaroxolyn beginning tomorrow.  We will bring him back for EKG and blood work which is Chem-7 and BNP in a week or next Tuesday. Follow-up by tele-visit in 1 week.  We will also try to get the echocardiogram fairly quickly. Risks of obesity explained and I urged him to take measures to reduce weight and he promises to do so.  He also has been diagnosed to have pancreatic cancer and is followed by his oncologist.  COVID-19 Education: The signs and symptoms of COVID-19 were discussed with the patient and how to seek care for testing (follow up with PCP or arrange E-visit).  The importance of social distancing was discussed today.  Time:   Today, I have spent 30 minutes with the patient with telehealth technology discussing the above problems.  Total time for evaluation including record review was 45 minutes.   Medication Adjustments/Labs and Tests Ordered: Current medicines are reviewed at length  with the patient today.  Concerns regarding medicines are outlined above.   Tests Ordered: No orders of the defined types were placed in this encounter.   Medication Changes: No orders of the defined types were placed in this encounter.   Disposition:  Follow up in 1 week(s)  Signed, Jenean Lindau, MD  11/02/2018 4:21 PM    Taylor Vargas

## 2018-11-02 NOTE — Telephone Encounter (Signed)
Spoke with Baxter Flattery because Brayton Layman is gone for day. 2nd request of records for urgent patient added to Dr.RRR schedule this afternoon.

## 2018-11-03 MED ORDER — METOLAZONE 2.5 MG PO TABS: 2.5000 mg | ORAL_TABLET | Freq: Every day | ORAL | 3 refills | Status: DC

## 2018-11-03 NOTE — Addendum Note (Signed)
Addended by: Beckey Rutter on: 11/03/2018 10:06 AM   Modules accepted: Orders

## 2018-11-03 NOTE — Patient Instructions (Signed)
Medication Instructions:  STOP taking furosemide 11/03/2018 START taking zaroxolyn 2.5 mg (1 tablet) daily  If you need a refill on your cardiac medications before your next appointment, please call your pharmacy.   Lab work: You will have a BNP and a BMP drawn after your office visit on Tuesday, Nov 08, 2018.  If you have labs (blood work) drawn today and your tests are completely normal, you will receive your results only by: Marland Kitchen MyChart Message (if you have MyChart) OR . A paper copy in the mail If you have any lab test that is abnormal or we need to change your treatment, we will call you to review the results.  Testing/Procedures: You will have an EKG performed on 11/08/18 in the office.  Your physician has requested that you have an echocardiogram. Echocardiography is a painless test that uses sound waves to create images of your heart. It provides your doctor with information about the size and shape of your heart and how well your heart's chambers and valves are working. This procedure takes approximately one hour. There are no restrictions for this procedure.    Follow-Up: At [facility], you and your health needs are our priority.  As part of our continuing mission to provide you with exceptional heart care, we have created designated Provider Care Teams.  These Care Teams include your primary Cardiologist (physician) and Advanced Practice Providers (APPs -  Physician Assistants and Nurse Practitioners) who all work together to provide you with the care you need, when you need it. You will need a follow up appointment in 1 weeks.    Any Other Special Instructions Will Be Listed Below  Metolazone tablets What is this medicine? METOLAZONE (me TOLE a zone) is a diuretic. It increases the amount of urine passed, which causes the body to lose salt and water. This medicine is used to treat high blood pressure. It is also reduces the swelling and water retention caused by heart or  kidney disease. This medicine may be used for other purposes; ask your health care provider or pharmacist if you have questions. COMMON BRAND NAME(S): Mykrox, Zaroxolyn What should I tell my health care provider before I take this medicine? They need to know if you have any of these conditions: -diabetes -gout -immune system problems, like lupus -kidney disease -liver disease -pancreatitis -small amount of urine or difficulty passing urine -an unusual or allergic reaction to metolazone, sulfa drugs, other medicines, foods, dyes, or preservatives -pregnant or trying to get pregnant -breast-feeding How should I use this medicine? Take this medicine by mouth with a glass of water. Follow the directions on the prescription label. Remember that you will need to pass urine frequently after taking this medicine. Do not take your doses at a time of day that will cause you problems. Do not take at bedtime. Take your medicine at regular intervals. Do not take your medicine more often than directed. Do not stop taking except on your doctor's advice. Talk to your pediatrician regarding the use of this medicine in children. Special care may be needed. Overdosage: If you think you have taken too much of this medicine contact a poison control center or emergency room at once. NOTE: This medicine is only for you. Do not share this medicine with others. What if I miss a dose? If you miss a dose, take it as soon as you can. If it is almost time for your next dose, take only that dose. Do not take double or  extra doses. What may interact with this medicine? -alcohol -antiinflammatory drugs for pain or swelling -barbiturates for sleep or seizure control -digoxin -dofetilide -lithium -medicines for blood sugar -medicines for high blood pressure -medicines that relax muscles for surgery -methenamine -other diuretics -some medicines for pain -steroid hormones like cortisone, hydrocortisone, and  prednisone -warfarin This list may not describe all possible interactions. Give your health care provider a list of all the medicines, herbs, non-prescription drugs, or dietary supplements you use. Also tell them if you smoke, drink alcohol, or use illegal drugs. Some items may interact with your medicine. What should I watch for while using this medicine? Visit your doctor or health care professional for regular checks on your progress. Check your blood pressure as directed. Ask your doctor or health care professional what your blood pressure should be and when you should contact him or her. You may need to be on a special diet while taking this medicine. Ask your doctor. Check with your doctor or health care professional if you get an attack of severe diarrhea, nausea and vomiting, or if you sweat a lot. The loss of too much body fluid can make it dangerous for you to take this medicine. You may get drowsy or dizzy. Do not drive, use machinery, or do anything that needs mental alertness until you know how this medicine affects you. Do not stand or sit up quickly, especially if you are an older patient. This reduces the risk of dizzy or fainting spells. Alcohol may interfere with the effect of this medicine. Avoid alcoholic drinks. This medicine may affect your blood sugar level. If you have diabetes, check with your doctor or health care professional before changing the dose of your diabetic medicine. This medicine can make you more sensitive to the sun. Keep out of the sun. If you cannot avoid being in the sun, wear protective clothing and use sunscreen. Do not use sun lamps or tanning beds/booths. What side effects may I notice from receiving this medicine? Side effects that you should report to your doctor or health care professional as soon as possible: -allergic reactions such as skin rash or itching, hives, swelling of the lips, mouth, tongue, or throat -fast or irregular heartbeat, chest pain  -feeling faint -fever, chills -gout pain -hot red lump on leg -muscle pain, cramps -nausea, vomiting -numbness or tingling in hands, feet -pain or difficulty when passing urine -redness, blistering, peeling or loosening of the skin, including inside the mouth -unusual bleeding or bruising -unusually weak or tired -yellowing of the eyes, skin Side effects that usually do not require medical attention (report to your doctor or health care professional if they continue or are bothersome): -abdominal pain -blurred vision -constipation or diarrhea -dry mouth -headache This list may not describe all possible side effects. Call your doctor for medical advice about side effects. You may report side effects to FDA at 1-800-FDA-1088. Where should I keep my medicine? Keep out of the reach of children. Store at room temperature between 15 and 30 degrees C (59 and 86 degrees F). Protect from light. Keep container tightly closed. Throw away any unused medicine after the expiration date. NOTE: This sheet is a summary. It may not cover all possible information. If you have questions about this medicine, talk to your doctor, pharmacist, or health care provider.  2019 Elsevier/Gold Standard (2007-12-19 14:11:48)    Echocardiogram An echocardiogram is a procedure that uses painless sound waves (ultrasound) to produce an image of the heart. Images  from an echocardiogram can provide important information about:  Signs of coronary artery disease (CAD).  Aneurysm detection. An aneurysm is a weak or damaged part of an artery wall that bulges out from the normal force of blood pumping through the body.  Heart size and shape. Changes in the size or shape of the heart can be associated with certain conditions, including heart failure, aneurysm, and CAD.  Heart muscle function.  Heart valve function.  Signs of a past heart attack.  Fluid buildup around the heart.  Thickening of the heart muscle.   A tumor or infectious growth around the heart valves. Tell a health care provider about:  Any allergies you have.  All medicines you are taking, including vitamins, herbs, eye drops, creams, and over-the-counter medicines.  Any blood disorders you have.  Any surgeries you have had.  Any medical conditions you have.  Whether you are pregnant or may be pregnant. What are the risks? Generally, this is a safe procedure. However, problems may occur, including:  Allergic reaction to dye (contrast) that may be used during the procedure. What happens before the procedure? No specific preparation is needed. You may eat and drink normally. What happens during the procedure?   An IV tube may be inserted into one of your veins.  You may receive contrast through this tube. A contrast is an injection that improves the quality of the pictures from your heart.  A gel will be applied to your chest.  A wand-like tool (transducer) will be moved over your chest. The gel will help to transmit the sound waves from the transducer.  The sound waves will harmlessly bounce off of your heart to allow the heart images to be captured in real-time motion. The images will be recorded on a computer. The procedure may vary among health care providers and hospitals. What happens after the procedure?  You may return to your normal, everyday life, including diet, activities, and medicines, unless your health care provider tells you not to do that. Summary  An echocardiogram is a procedure that uses painless sound waves (ultrasound) to produce an image of the heart.  Images from an echocardiogram can provide important information about the size and shape of your heart, heart muscle function, heart valve function, and fluid buildup around your heart.  You do not need to do anything to prepare before this procedure. You may eat and drink normally.  After the echocardiogram is completed, you may return to your  normal, everyday life, unless your health care provider tells you not to do that. This information is not intended to replace advice given to you by your health care provider. Make sure you discuss any questions you have with your health care provider. Document Released: 05/29/2000 Document Revised: 07/04/2016 Document Reviewed: 07/04/2016 Elsevier Interactive Patient Education  2019 [employer].

## 2018-11-08 ENCOUNTER — Ambulatory Visit (INDEPENDENT_AMBULATORY_CARE_PROVIDER_SITE_OTHER): Payer: Medicaid Other | Admitting: Cardiology

## 2018-11-08 ENCOUNTER — Encounter: Payer: Self-pay | Admitting: Cardiology

## 2018-11-08 ENCOUNTER — Other Ambulatory Visit: Payer: Self-pay

## 2018-11-08 VITALS — BP 132/86 | HR 105 | Ht 73.0 in

## 2018-11-08 DIAGNOSIS — E7800 Pure hypercholesterolemia, unspecified: Secondary | ICD-10-CM | POA: Diagnosis not present

## 2018-11-08 DIAGNOSIS — J431 Panlobular emphysema: Secondary | ICD-10-CM | POA: Diagnosis not present

## 2018-11-08 DIAGNOSIS — R0789 Other chest pain: Secondary | ICD-10-CM

## 2018-11-08 HISTORY — DX: Other chest pain: R07.89

## 2018-11-08 MED ORDER — DILTIAZEM HCL ER COATED BEADS 120 MG PO CP24: ORAL_CAPSULE | ORAL | 0 refills | Status: DC

## 2018-11-08 MED ORDER — NITROGLYCERIN 0.4 MG SL SUBL: 0.4000 mg | SUBLINGUAL_TABLET | SUBLINGUAL | 3 refills | Status: DC | PRN

## 2018-11-08 NOTE — Progress Notes (Signed)
Cardiology Office Note:    Date:  11/08/2018   ID:  Teran Knittle, DOB 04-18-1961, MRN [id number]  PCP:  Street, Sharon Mt, MD  Cardiologist:  Jenean Lindau, MD   Referring MD: [address], Sharon Mt, *    ASSESSMENT:    1. Chest tightness   2. Panlobular emphysema ([place])   3. Morbid obesity ([place])   4. Hypercholesterolemia    PLAN:    In order of problems listed above:  1. Chest tightness: The patient has multiple risk factors for coronary artery disease.  In view of the above I discussed the following with him.  Sublingual nitroglycerin prescription was sent, its protocol and 911 protocol explained and the patient vocalized understanding questions were answered to the patient's satisfaction 2. Evaluation invasive and noninvasive was discussed.  Benefits and potential risks explained and he vocalized understanding.  He opts for coronary CT angiography and we will get this test done on him.  He will have an echocardiogram in the next few days and a Chem-7 today.  Based on this we will decide how to continue his Zaroxolyn diuretic.  He will have a follow-up appointment with me in a week.  He has been advised to take Cardizem 120 mg on the morning of the test.  He will keep himself hydrated and also have somebody drive him for the CT angiography test.  Further recommendations will be made based on the above findings.  He knows to go to the nearest emergency room for any concerning symptoms.  Primary prevention stressed.  Patient had multiple questions which were answered to his satisfaction.   Medication Adjustments/Labs and Tests Ordered: Current medicines are reviewed at length with the patient today.  Concerns regarding medicines are outlined above.  No orders of the defined types were placed in this encounter.  Meds ordered this encounter  Medications  . nitroGLYCERIN (NITROSTAT) 0.4 MG SL tablet    Sig: Place 1 tablet (0.4 mg total) under the tongue every 5 (five) minutes as  needed.    Dispense:  25 tablet    Refill:  3     No chief complaint on file.    History of Present Illness:    Daquane Aguilar is a 58 y.o. male with past medical history of COPD, oxygen and obstructive sleep apnea.  The patient is morbidly obese.  He uses oxygen.  He takes care of activities of daily living with a sedentary lifestyle.  He gives history of some chest tightness at times.  No orthopnea or PND.  His pedal edema is much better as compared to my last evaluation.  He is taking Zaroxolyn at this time.  At the time of my evaluation, the patient is alert awake oriented and in no distress.  Past Medical History:  Diagnosis Date  . Arthritis   . Congenital absence of one kidney    01-04-2018 per pt unsure which one  . COPD (chronic obstructive pulmonary disease) ([place])    pulmologist-  dr Alcide Clever [name])  . GERD (gastroesophageal reflux disease)   . On supplemental oxygen therapy    01-04-2018  per pt prescribed as needed ,  states checks oxygen level daily,  and average between 88-90% on room air,  per pt has not needed O2 past 4 months  . OSA on CPAP   . Pancreatic cancer ([place])    01-04-2018  per pt dx 10/ 2009 found by CT imaging, no surgery,  treatment chemotherapy  @ [facility],  in remission  since 2017    Past Surgical History:  Procedure Laterality Date  . COLONOSCOPY  last one 2019  . KNEE ARTHROSCOPY Right 2002  . KNEE ARTHROSCOPY WITH MEDIAL MENISECTOMY Left 01/06/2018   Procedure: LEFT KNEE ARTHROSCOPY WITH MEDIAL MENISECTOMY;  Surgeon: Rod Can, MD;  Location: [facility];  Service: Orthopedics;  Laterality: Left;    Current Medications: No outpatient medications have been marked as taking for the 11/08/18 encounter (Office Visit) with Revankar, Reita Cliche, MD.     Allergies:   Patient has no known allergies.   Social History   Socioeconomic History  . Marital status: Single    Spouse name: Not on file  . Number of children: Not on file  .  Years of education: Not on file  . Highest education level: Not on file  Occupational History  . Not on file  Social Needs  . Financial resource strain: Not on file  . Food insecurity:    Worry: Not on file    Inability: Not on file  . Transportation needs:    Medical: Not on file    Non-medical: Not on file  Tobacco Use  . Smoking status: Current Every Day Smoker    Years: 31.00    Types: Cigarettes  . Smokeless tobacco: Never Used  . Tobacco comment: 01-04-2018  per pt down from 1/2 ppd down to 3-4 cig daily  Substance and Sexual Activity  . Alcohol use: Yes    Frequency: Never    Comment: very rare  . Drug use: Never  . Sexual activity: Not on file  Lifestyle  . Physical activity:    Days per week: Not on file    Minutes per session: Not on file  . Stress: Not on file  Relationships  . Social connections:    Talks on phone: Not on file    Gets together: Not on file    Attends religious service: Not on file    Active member of club or organization: Not on file    Attends meetings of clubs or organizations: Not on file    Relationship status: Not on file  Other Topics Concern  . Not on file  Social History Narrative  . Not on file     Family History: The patient's family history is not on file.  ROS:   Please see the history of present illness.    All other systems reviewed and are negative.  EKGs/Labs/Other Studies Reviewed:    The following studies were reviewed today: As mentioned above.  EKG reveals sinus rhythm tachycardia and nonspecific ST-T changes   Recent Labs: No results found for requested labs within last 8760 hours.  Recent Lipid Panel No results found for: CHOL, TRIG, HDL, CHOLHDL, VLDL, LDLCALC, LDLDIRECT  Physical Exam:    VS:  There were no vitals taken for this visit.    Wt Readings from Last 3 Encounters:  11/02/18 (!) 325 lb (147.4 kg)  01/06/18 293 lb 8 oz (133.1 kg)     GEN: Patient is in no acute distress HEENT: Normal  NECK: No JVD; No carotid bruits LYMPHATICS: No lymphadenopathy CARDIAC: Hear sounds regular, 2/6 systolic murmur at the apex. RESPIRATORY:  Clear to auscultation without rales, wheezing or rhonchi  ABDOMEN: Soft, non-tender, non-distended MUSCULOSKELETAL:  No edema; No deformity  SKIN: Warm and dry NEUROLOGIC:  Alert and oriented x 3 PSYCHIATRIC:  Normal affect   Signed, Jenean Lindau, MD  11/08/2018 11:10 AM  [facility]

## 2018-11-08 NOTE — Patient Instructions (Signed)
Medication Instructions:  START taking nitroglycerin as needed for chest pain When having chest pain, stop what you are doing and sit down. Take 1 nitro, wait 5 minutes. Still having chest pain, take 1 nitro, wait 5 minutes. Still having chest pain, take 1 nitro, dial 911. Total of 3 nitro in 15 minutes.    If you need a refill on your cardiac medications before your next appointment, please call your pharmacy.   Lab work: NONE If you have labs (blood work) drawn today and your tests are completely normal, you will receive your results only by: Marland Kitchen MyChart Message (if you have MyChart) OR . A paper copy in the mail If you have any lab test that is abnormal or we need to change your treatment, we will call you to review the results.  Testing/Procedures:  YOU WILL BE CALLED TO SCHEDULE A CARDIAC CT Please arrive at the [facility] main entrance of [facility] (30-45 minutes prior to test start time)  [facility], [place] [address] [phone]  Proceed to the [facility] Radiology Department (First Floor).  Please follow these instructions carefully (unless otherwise directed):  Hold all erectile dysfunction medications at least 48 hours prior to test.  On the Night Before the Test: . Be sure to Drink plenty of water. . Do not consume any caffeinated/decaffeinated beverages or chocolate 12 hours prior to your test. . Do not take any antihistamines 12 hours prior to your test.    On the Day of the Test: . Drink plenty of water. Do not drink any water within one hour of the test. . Do not eat any food 4 hours prior to the test. . You may take your regular medications prior to the test.  . Take one diltiazem capsule two hours prior to test. . HOLD metolazone the morning of the test.        After the Test: . Drink plenty of water. . After receiving IV contrast, you may experience a mild flushed feeling. This is normal. . On  occasion, you may experience a mild rash up to 24 hours after the test. This is not dangerous. If this occurs, you can take Benadryl 25 mg and increase your fluid intake. . If you experience trouble breathing, this can be serious. If it is severe call 911 IMMEDIATELY. If it is mild, please call our office. . If you take any of these medications: Glipizide/Metformin, Avandament, Glucavance, please do not take 48 hours after completing test.   Follow-Up: At [facility], you and your health needs are our priority.  As part of our continuing mission to provide you with exceptional heart care, we have created designated Provider Care Teams.  These Care Teams include your primary Cardiologist (physician) and Advanced Practice Providers (APPs -  Physician Assistants and Nurse Practitioners) who all work together to provide you with the care you need, when you need it. You will need a follow up appointment in 1 weeks.   Any Other Special Instructions Will Be Listed Below  Cardiac CT Angiogram  A cardiac CT angiogram is a procedure to look at the heart and the area around the heart. It may be done to help find the cause of chest pains or other symptoms of heart disease. During this procedure, a large X-ray machine, called a CT scanner, takes detailed pictures of the heart and the surrounding area after a dye (contrast material) has been injected into blood vessels  in the area. The procedure is also sometimes called a coronary CT angiogram, coronary artery scanning, or CTA. A cardiac CT angiogram allows the health care provider to see how well blood is flowing to and from the heart. The health care provider will be able to see if there are any problems, such as:  Blockage or narrowing of the coronary arteries in the heart.  Fluid around the heart.  Signs of weakness or disease in the muscles, valves, and tissues of the heart. Tell a health care provider about:  Any allergies you have. This is  especially important if you have had a previous allergic reaction to contrast dye.  All medicines you are taking, including vitamins, herbs, eye drops, creams, and over-the-counter medicines.  Any blood disorders you have.  Any surgeries you have had.  Any medical conditions you have.  Whether you are pregnant or may be pregnant.  Any anxiety disorders, chronic pain, or other conditions you have that may increase your stress or prevent you from lying still. What are the risks? Generally, this is a safe procedure. However, problems may occur, including:  Bleeding.  Infection.  Allergic reactions to medicines or dyes.  Damage to other structures or organs.  Kidney damage from the dye or contrast that is used.  Increased risk of cancer from radiation exposure. This risk is low. Talk with your health care provider about: ? The risks and benefits of testing. ? How you can receive the lowest dose of radiation. What happens before the procedure?  Wear comfortable clothing and remove any jewelry, glasses, dentures, and hearing aids.  Follow instructions from your health care provider about eating and drinking. This may include: ? For 12 hours before the test - avoid caffeine. This includes tea, coffee, soda, energy drinks, and diet pills. Drink plenty of water or other fluids that do not have caffeine in them. Being well-hydrated can prevent complications. ? For 4-6 hours before the test - stop eating and drinking. The contrast dye can cause nausea, but this is less likely if your stomach is empty.  Ask your health care provider about changing or stopping your regular medicines. This is especially important if you are taking diabetes medicines, blood thinners, or medicines to treat erectile dysfunction. What happens during the procedure?  Hair on your chest may need to be removed so that small sticky patches called electrodes can be placed on your chest. These will transmit  information that helps to monitor your heart during the test.  An IV tube will be inserted into one of your veins.  You might be given a medicine to control your heart rate during the test. This will help to ensure that good images are obtained.  You will be asked to lie on an exam table. This table will slide in and out of the CT machine during the procedure.  Contrast dye will be injected into the IV tube. You might feel warm, or you may get a metallic taste in your mouth.  You will be given a medicine (nitroglycerin) to relax (dilate) the arteries in your heart.  The table that you are lying on will move into the CT machine tunnel for the scan.  The person running the machine will give you instructions while the scans are being done. You may be asked to: ? Keep your arms above your head. ? Hold your breath. ? Stay very still, even if the table is moving.  When the scanning is complete, you will  be moved out of the machine.  The IV tube will be removed. The procedure may vary among health care providers and hospitals. What happens after the procedure?  You might feel warm, or you may get a metallic taste in your mouth from the contrast dye.  You may have a headache from the nitroglycerin.  After the procedure, drink water or other fluids to wash (flush) the contrast material out of your body.  Contact a health care provider if you have any symptoms of allergy to the contrast. These symptoms include: ? Shortness of breath. ? Rash or hives. ? A racing heartbeat.  Most people can return to their normal activities right after the procedure. Ask your health care provider what activities are safe for you.  It is up to you to get the results of your procedure. Ask your health care provider, or the department that is doing the procedure, when your results will be ready. Summary  A cardiac CT angiogram is a procedure to look at the heart and the area around the heart. It may be done  to help find the cause of chest pains or other symptoms of heart disease.  During this procedure, a large X-ray machine, called a CT scanner, takes detailed pictures of the heart and the surrounding area after a dye (contrast material) has been injected into blood vessels in the area.  Ask your health care provider about changing or stopping your regular medicines before the procedure. This is especially important if you are taking diabetes medicines, blood thinners, or medicines to treat erectile dysfunction.  After the procedure, drink water or other fluids to wash (flush) the contrast material out of your body. This information is not intended to replace advice given to you by your health care provider. Make sure you discuss any questions you have with your health care provider. Document Released: 05/14/2008 Document Revised: 04/20/2016 Document Reviewed: 04/20/2016 Elsevier Interactive Patient Education  2019 [employer].

## 2018-11-09 LAB — BASIC METABOLIC PANEL
BUN/Creatinine Ratio: 13 (ref 10–24)
BUN: 15 mg/dL (ref 8–27)
CO2: 25 mmol/L (ref 20–29)
Calcium: 9.8 mg/dL (ref 8.6–10.2)
Chloride: 92 mmol/L — ABNORMAL LOW (ref 96–106)
Creatinine, Ser: 1.12 mg/dL (ref 0.76–1.27)
GFR calc Af Amer: 82 mL/min/{1.73_m2} (ref >59)
GFR calc non Af Amer: 71 mL/min/{1.73_m2} (ref >59)
Glucose: 122 mg/dL — ABNORMAL HIGH (ref 65–99)
Potassium: 3.2 mmol/L — ABNORMAL LOW (ref 3.5–5.2)
Sodium: 138 mmol/L (ref 134–144)

## 2018-11-09 LAB — PRO B NATRIURETIC PEPTIDE: NT-Pro BNP: 40 pg/mL (ref 0–210)

## 2018-11-14 ENCOUNTER — Telehealth: Payer: Self-pay | Admitting: Cardiology

## 2018-11-14 ENCOUNTER — Telehealth: Payer: Self-pay

## 2018-11-14 DIAGNOSIS — E876 Hypokalemia: Secondary | ICD-10-CM

## 2018-11-14 MED ORDER — POTASSIUM CHLORIDE ER 20 MEQ PO TBCR: EXTENDED_RELEASE_TABLET | ORAL | 0 refills | Status: DC

## 2018-11-14 NOTE — Telephone Encounter (Signed)
-----   Message from Jenean Lindau, MD sent at 11/09/2018  9:42 AM EDT ----- 40 mEq of potassium daily and Chem-7 in a week Jenean Lindau, MD 11/09/2018 9:42 AM

## 2018-11-14 NOTE — Telephone Encounter (Signed)
Pt states he is supposed to have potassium called to [facility] on [facility]

## 2018-11-14 NOTE — Telephone Encounter (Signed)
Information relayed to patient and he will come back into office for repeat lab. Order for potassium sent to requested pharmacy.

## 2018-11-15 ENCOUNTER — Other Ambulatory Visit: Payer: Self-pay

## 2018-11-15 ENCOUNTER — Telehealth (INDEPENDENT_AMBULATORY_CARE_PROVIDER_SITE_OTHER): Payer: Medicaid Other | Admitting: Cardiology

## 2018-11-15 ENCOUNTER — Encounter: Payer: Self-pay | Admitting: Cardiology

## 2018-11-15 ENCOUNTER — Telehealth: Payer: Self-pay

## 2018-11-15 VITALS — Ht 73.0 in | Wt 300.0 lb

## 2018-11-15 DIAGNOSIS — I209 Angina pectoris, unspecified: Secondary | ICD-10-CM

## 2018-11-15 DIAGNOSIS — R739 Hyperglycemia, unspecified: Secondary | ICD-10-CM

## 2018-11-15 DIAGNOSIS — E7800 Pure hypercholesterolemia, unspecified: Secondary | ICD-10-CM

## 2018-11-15 NOTE — Patient Instructions (Addendum)
Medication Instructions:  Your physician recommends that you continue on your current medications as directed. Please refer to the Current Medication list given to you today.  If you need a refill on your cardiac medications before your next appointment, please call your pharmacy.   Lab work: NONE If you have labs (blood work) drawn today and your tests are completely normal, you will receive your results only by: Marland Kitchen MyChart Message (if you have MyChart) OR . A paper copy in the mail If you have any lab test that is abnormal or we need to change your treatment, we will call you to review the results.  Testing/Procedures: A chest x-ray takes a picture of the organs and structures inside the chest, including the heart, lungs, and blood vessels. This test can show several things, including, whether the heart is enlarges; whether fluid is building up in the lungs; and whether pacemaker / defibrillator leads are still in place. You will need to go to [facility] for this procedure on 11/16/18.  YOU were also advised to call  [phone] to schedule you COVID 19 screening testing on 11/16/18. THIS IS TIME SENSITIVE and could delay your procedure.     Your physician has requested that you have a cardiac catheterization. Cardiac catheterization is used to diagnose and/or treat various heart conditions. Doctors may recommend this procedure for a number of different reasons. The most common reason is to evaluate chest pain. Chest pain can be a symptom of coronary artery disease (CAD), and cardiac catheterization can show whether plaque is narrowing or blocking your heart's arteries. This procedure is also used to evaluate the valves, as well as measure the blood flow and oxygen levels in different parts of your heart. For further information please visit [email or web address]. Please follow instruction sheet, as given.     [place] [id number] Dept: [phone] Loc: [phone]  Infant Zink  11/15/2018  You are scheduled for a Cardiac Catheterization on Monday, November 21, 2018 at 9 AM with Dr.End.  1. Please arrive at the [facility] (Main Entrance A) at [facility]: [place], [place] [address] at 7:00 AM (This time is two hours before your procedure to ensure your preparation). Free valet parking service is available.   Special note: Every effort is made to have your procedure done on time. Please understand that emergencies sometimes delay scheduled procedures.  2. Diet: Do not eat solid foods after midnight.  The patient may have clear liquids until 5am upon the day of the procedure.  3. Labs: NONE  4. Medication instructions in preparation for your procedure:   Contrast Allergy: No   DO NOT TAKE YOUR Zaroxolyn 12 hours prior to procedure.   On the morning of your procedure, take your Aspirin and any morning medicines NOT listed above.  You may use sips of water.  5. Plan for one night stay--bring personal belongings. 6. Bring a current list of your medications and current insurance cards. 7. You MUST have a responsible person to drive you home. 8. Someone MUST be with you the first 24 hours after you arrive home or your discharge will be delayed. 9. Please wear clothes that are easy to get on and off and wear slip-on shoes.  Thank you for allowing [place] to care for you!   -- [facility]    Follow-Up: At [facility], you  and your health needs are our priority.  As part of our continuing mission to provide you with exceptional heart care, we have created designated Provider Care Teams.  These Care Teams include your primary Cardiologist (physician) and Advanced Practice Providers (APPs -  Physician Assistants and Nurse Practitioners) who all work together to provide you with the care you need, when you  need it. You will need a follow up appointment after your procedure is complete  Any Other Special Instructions Will Be Listed Below   Coronary Angiogram With Stent Coronary angiogram with stent placement is a procedure to widen or open a narrow blood vessel of the heart (coronary artery). Arteries may become blocked by cholesterol buildup (plaques) in the lining of the wall. When a coronary artery becomes partially blocked, blood flow to that area decreases. This may lead to chest pain or a heart attack (myocardial infarction). A stent is a small piece of metal that looks like mesh or a spring. Stent placement may be done as treatment for a heart attack or right after a coronary angiogram in which a blocked artery is found. Let your health care provider know about:  Any allergies you have.  All medicines you are taking, including vitamins, herbs, eye drops, creams, and over-the-counter medicines.  Any problems you or family members have had with anesthetic medicines.  Any blood disorders you have.  Any surgeries you have had.  Any medical conditions you have.  Whether you are pregnant or may be pregnant. What are the risks? Generally, this is a safe procedure. However, problems may occur, including:  Damage to the heart or its blood vessels.  A return of blockage.  Bleeding, infection, or bruising at the insertion site.  A collection of blood under the skin (hematoma) at the insertion site.  A blood clot in another part of the body.  Kidney injury.  Allergic reaction to the dye or contrast that is used.  Bleeding into the abdomen (retroperitoneal bleeding). What happens before the procedure? Staying hydrated Follow instructions from your health care provider about hydration, which may include:  Up to 2 hours before the procedure - you may continue to drink clear liquids, such as water, clear fruit juice, black coffee, and plain tea.  Eating and drinking restrictions  Follow instructions from your health care provider about eating and drinking, which may include:  8 hours before the procedure - stop eating heavy meals or foods such as meat, fried foods, or fatty foods.  6 hours before the procedure - stop eating light meals or foods, such as toast or cereal.  2 hours before the procedure - stop drinking clear liquids. Ask your health care provider about:  Changing or stopping your regular medicines. This is especially important if you are taking diabetes medicines or blood thinners.  Taking medicines such as ibuprofen. These medicines can thin your blood. Do not take these medicines before your procedure if your health care provider instructs you not to. Generally, aspirin is recommended before a procedure of passing a small, thin tube (catheter) through a blood vessel and into the heart (cardiac catheterization). What happens during the procedure?   An IV tube will be inserted into one of your veins.  You will be given one or more of the following: ? A medicine to help you relax (sedative). ? A medicine to numb the area where the catheter will be inserted into an artery (local anesthetic).  To reduce your risk of infection: ? Your health  care team will wash or sanitize their hands. ? Your skin will be washed with soap. ? Hair may be removed from the area where the catheter will be inserted.  Using a guide wire, the catheter will be inserted into an artery. The location may be in your groin, in your wrist, or in the fold of your arm (near your elbow).  A type of X-ray (fluoroscopy) will be used to help guide the catheter to the opening of the arteries in the heart.  A dye will be injected into the catheter, and X-rays will be taken. The dye will help to show where any narrowing or blockages are located in the arteries.  A tiny wire will be guided to the blocked spot, and a balloon will be inflated to make the artery wider.  The stent will be  expanded and will crush the plaques into the wall of the vessel. The stent will hold the area open and improve the blood flow. Most stents have a drug coating to reduce the risk of the stent narrowing over time.  The artery may be made wider using a drill, laser, or other tools to remove plaques.  When the blood flow is better, the catheter will be removed. The lining of the artery will grow over the stent, which stays where it was placed. This procedure may vary among health care providers and hospitals. What happens after the procedure?  If the procedure is done through the leg, you will be kept in bed lying flat for about 6 hours. You will be instructed to not bend and not cross your legs.  The insertion site will be checked frequently.  The pulse in your foot or wrist will be checked frequently.  You may have additional blood tests, X-rays, and a test that records the electrical activity of your heart (electrocardiogram, or ECG). This information is not intended to replace advice given to you by your health care provider. Make sure you discuss any questions you have with your health care provider. Document Released: 12/06/2002 Document Revised: 09/10/2017 Document Reviewed: 01/05/2016 Elsevier Interactive Patient Education  2019 [employer]

## 2018-11-15 NOTE — Progress Notes (Signed)
Virtual Visit via Video Note   This visit type was conducted due to national recommendations for restrictions regarding the COVID-19 Pandemic (e.g. social distancing) in an effort to limit this patient's exposure and mitigate transmission in our community.  Due to his co-morbid illnesses, this patient is at least at moderate risk for complications without adequate follow up.  This format is felt to be most appropriate for this patient at this time.  All issues noted in this document were discussed and addressed.  A limited physical exam was performed with this format.  Please refer to the patient's chart for his consent to telehealth for [facility].   Date:  11/15/2018   ID:  Taylor Vargas, DOB 1961-05-01, MRN [id number]  Patient Location: Home Provider Location: Home  PCP:  Street, Sharon Mt, MD  Cardiologist:  No primary care provider on file.  Electrophysiologist:  None   Evaluation Performed:  Follow-Up Visit  Chief Complaint: Angina pectoris  History of Present Illness:    Taylor Vargas is a 58 y.o. male with past medical history of essential hypertension dyslipidemia smoking and morbid obesity.  Patient was evaluated by me for chest tightness symptoms.  He was awaiting echocardiogram and coronary CT angiography.  The patient mentions to me now that when he exerts himself he has substernal chest tightness.  He has used nitroglycerin 1 tablet once and on another use on one occasion.  He has used 2 tablets with relief of substernal chest tightness.  This is new for him.  In the past couple of days he has not had such issues but he is a little worried and scared to exert himself.  At the time of my evaluation, the patient is alert awake oriented and in no distress.  The patient does not have symptoms concerning for COVID-19 infection (fever, chills, cough, or new shortness of breath).    Past Medical History:  Diagnosis Date  . Arthritis   . Congenital absence of one kidney    01-04-2018 per pt unsure which one  . COPD (chronic obstructive pulmonary disease) ([place])    pulmologist-  dr Alcide Clever [name])  . GERD (gastroesophageal reflux disease)   . On supplemental oxygen therapy    01-04-2018  per pt prescribed as needed ,  states checks oxygen level daily,  and average between 88-90% on room air,  per pt has not needed O2 past 4 months  . OSA on CPAP   . Pancreatic cancer ([place])    01-04-2018  per pt dx 10/ 2009 found by CT imaging, no surgery,  treatment chemotherapy  @ [facility],  in remission since 2017   Past Surgical History:  Procedure Laterality Date  . COLONOSCOPY  last one 2019  . KNEE ARTHROSCOPY Right 2002  . KNEE ARTHROSCOPY WITH MEDIAL MENISECTOMY Left 01/06/2018   Procedure: LEFT KNEE ARTHROSCOPY WITH MEDIAL MENISECTOMY;  Surgeon: Rod Can, MD;  Location: [place];  Service: Orthopedics;  Laterality: Left;     Current Meds  Medication Sig  . albuterol (ACCUNEB) 0.63 MG/3ML nebulizer solution Take 1 ampule by nebulization every 6 (six) hours as needed for wheezing.   . Albuterol Sulfate (PROAIR HFA IN) Inhale into the lungs.  . ALPRAZolam (XANAX) 0.25 MG tablet Take 0.25 mg by mouth 3 (three) times daily as needed.   . benzonatate (TESSALON) 200 MG capsule Take 200 mg by mouth 3 (three) times daily as needed for cough.  . diltiazem (CARDIZEM CD) 120 MG 24 hr capsule  To be taken morning of ct angio (Patient taking differently: Take 120 mg by mouth daily. To be taken morning of ct angio)  . ibuprofen (ADVIL,MOTRIN) 200 MG tablet Take 200 mg by mouth every 6 (six) hours as needed.  Marland Kitchen levofloxacin (LEVAQUIN) 500 MG tablet Take 250 mg by mouth daily.  . metolazone (ZAROXOLYN) 2.5 MG tablet Take 1 tablet (2.5 mg total) by mouth daily.  . montelukast (SINGULAIR) 10 MG tablet Take 10 mg by mouth at bedtime.  . nitroGLYCERIN (NITROSTAT) 0.4 MG SL tablet Place 1 tablet (0.4 mg total) under the tongue every 5 (five) minutes as needed.  .  Omeprazole 20 MG TBEC Take 20 mg by mouth every morning.   . potassium chloride 20 MEQ TBCR Take 2 tablets (40 meq ) once daily  . Tiotropium Bromide-Olodaterol (STIOLTO RESPIMAT) 2.5-2.5 MCG/ACT AERS Inhale into the lungs every morning.   . vitamin C (ASCORBIC ACID) 500 MG tablet Take 500 mg by mouth daily.     Allergies:   Patient has no known allergies.   Social History   Tobacco Use  . Smoking status: Current Every Day Smoker    Years: 31.00    Types: Cigarettes  . Smokeless tobacco: Never Used  . Tobacco comment: 01-04-2018  per pt down from 1/2 ppd down to 3-4 cig daily  Substance Use Topics  . Alcohol use: Yes    Frequency: Never    Comment: very rare  . Drug use: Never     Family Hx: The patient's family history is not on file.  ROS:   Please see the history of present illness.    As mentioned above All other systems reviewed and are negative.   Prior CV studies:   The following studies were reviewed today:  I reviewed lab work and discussed with him.  Labs/Other Tests and Data Reviewed:    EKG:  No ECG reviewed.  Recent Labs: 11/08/2018: BUN 15; Creatinine, Ser 1.12; NT-Pro BNP 40; Potassium 3.2; Sodium 138   Recent Lipid Panel No results found for: CHOL, TRIG, HDL, CHOLHDL, LDLCALC, LDLDIRECT  Wt Readings from Last 3 Encounters:  11/15/18 300 lb (136.1 kg)  11/02/18 (!) 325 lb (147.4 kg)  01/06/18 293 lb 8 oz (133.1 kg)     Objective:    Vital Signs:  Ht 6\' 1"  (1.854 m)   Wt 300 lb (136.1 kg)   BMI 39.58 kg/m    VITAL SIGNS:  reviewed  ASSESSMENT & PLAN:    1. Angina pectoris:  COVID-19 Education: The signs and symptoms of COVID-19 were discussed with the patient and how to seek care for testing (follow up with PCP or arrange E-visit).  The importance of social distancing was discussed today.  Time:   Today, I have spent 15 minutes with the patient with telehealth technology discussing the above problems.     Medication  Adjustments/Labs and Tests Ordered: Current medicines are reviewed at length with the patient today.  Concerns regarding medicines are outlined above.   Tests Ordered: No orders of the defined types were placed in this encounter.   Medication Changes: No orders of the defined types were placed in this encounter.   Disposition:  Follow up in 3 month(s)  Signed, Jenean Lindau, MD  11/15/2018 1:16 PM    [facility]

## 2018-11-15 NOTE — Telephone Encounter (Signed)
Left message for patient to call back with questions about levoquin and scheduling chest xray at [facility].He has been scheduled for cath and needs to call [phone] to have COVID screening performed on 11/16/18.

## 2018-11-15 NOTE — H&P (View-Only) (Signed)
Virtual Visit via Video Note   This visit type was conducted due to national recommendations for restrictions regarding the COVID-19 Pandemic (e.g. social distancing) in an effort to limit this patient's exposure and mitigate transmission in our community.  Due to his co-morbid illnesses, this patient is at least at moderate risk for complications without adequate follow up.  This format is felt to be most appropriate for this patient at this time.  All issues noted in this document were discussed and addressed.  A limited physical exam was performed with this format.  Please refer to the patient's chart for his consent to telehealth for [facility].   Date:  11/15/2018   ID:  Taylor Vargas, DOB 1960-06-29, MRN [id number]  Patient Location: Home Provider Location: Home  PCP:  Street, Sharon Mt, MD  Cardiologist:  No primary care provider on file.  Electrophysiologist:  None   Evaluation Performed:  Follow-Up Visit  Chief Complaint: Angina pectoris  History of Present Illness:    Taylor Vargas is a 58 y.o. male with past medical history of essential hypertension dyslipidemia smoking and morbid obesity.  Patient was evaluated by me for chest tightness symptoms.  He was awaiting echocardiogram and coronary CT angiography.  The patient mentions to me now that when he exerts himself he has substernal chest tightness.  He has used nitroglycerin 1 tablet once and on another use on one occasion.  He has used 2 tablets with relief of substernal chest tightness.  This is new for him.  In the past couple of days he has not had such issues but he is a little worried and scared to exert himself.  At the time of my evaluation, the patient is alert awake oriented and in no distress.  The patient does not have symptoms concerning for COVID-19 infection (fever, chills, cough, or new shortness of breath).    Past Medical History:  Diagnosis Date  . Arthritis   . Congenital absence of one kidney    01-04-2018 per pt unsure which one  . COPD (chronic obstructive pulmonary disease) ([place])    pulmologist-  dr Alcide Clever [name])  . GERD (gastroesophageal reflux disease)   . On supplemental oxygen therapy    01-04-2018  per pt prescribed as needed ,  states checks oxygen level daily,  and average between 88-90% on room air,  per pt has not needed O2 past 4 months  . OSA on CPAP   . Pancreatic cancer ([place])    01-04-2018  per pt dx 10/ 2009 found by CT imaging, no surgery,  treatment chemotherapy  @ [facility],  in remission since 2017   Past Surgical History:  Procedure Laterality Date  . COLONOSCOPY  last one 2019  . KNEE ARTHROSCOPY Right 2002  . KNEE ARTHROSCOPY WITH MEDIAL MENISECTOMY Left 01/06/2018   Procedure: LEFT KNEE ARTHROSCOPY WITH MEDIAL MENISECTOMY;  Surgeon: Rod Can, MD;  Location: [place];  Service: Orthopedics;  Laterality: Left;     Current Meds  Medication Sig  . albuterol (ACCUNEB) 0.63 MG/3ML nebulizer solution Take 1 ampule by nebulization every 6 (six) hours as needed for wheezing.   . Albuterol Sulfate (PROAIR HFA IN) Inhale into the lungs.  . ALPRAZolam (XANAX) 0.25 MG tablet Take 0.25 mg by mouth 3 (three) times daily as needed.   . benzonatate (TESSALON) 200 MG capsule Take 200 mg by mouth 3 (three) times daily as needed for cough.  . diltiazem (CARDIZEM CD) 120 MG 24 hr capsule  To be taken morning of ct angio (Patient taking differently: Take 120 mg by mouth daily. To be taken morning of ct angio)  . ibuprofen (ADVIL,MOTRIN) 200 MG tablet Take 200 mg by mouth every 6 (six) hours as needed.  Marland Kitchen levofloxacin (LEVAQUIN) 500 MG tablet Take 250 mg by mouth daily.  . metolazone (ZAROXOLYN) 2.5 MG tablet Take 1 tablet (2.5 mg total) by mouth daily.  . montelukast (SINGULAIR) 10 MG tablet Take 10 mg by mouth at bedtime.  . nitroGLYCERIN (NITROSTAT) 0.4 MG SL tablet Place 1 tablet (0.4 mg total) under the tongue every 5 (five) minutes as needed.  .  Omeprazole 20 MG TBEC Take 20 mg by mouth every morning.   . potassium chloride 20 MEQ TBCR Take 2 tablets (40 meq ) once daily  . Tiotropium Bromide-Olodaterol (STIOLTO RESPIMAT) 2.5-2.5 MCG/ACT AERS Inhale into the lungs every morning.   . vitamin C (ASCORBIC ACID) 500 MG tablet Take 500 mg by mouth daily.     Allergies:   Patient has no known allergies.   Social History   Tobacco Use  . Smoking status: Current Every Day Smoker    Years: 31.00    Types: Cigarettes  . Smokeless tobacco: Never Used  . Tobacco comment: 01-04-2018  per pt down from 1/2 ppd down to 3-4 cig daily  Substance Use Topics  . Alcohol use: Yes    Frequency: Never    Comment: very rare  . Drug use: Never     Family Hx: The patient's family history is not on file.  ROS:   Please see the history of present illness.    As mentioned above All other systems reviewed and are negative.   Prior CV studies:   The following studies were reviewed today:  I reviewed lab work and discussed with him.  Labs/Other Tests and Data Reviewed:    EKG:  No ECG reviewed.  Recent Labs: 11/08/2018: BUN 15; Creatinine, Ser 1.12; NT-Pro BNP 40; Potassium 3.2; Sodium 138   Recent Lipid Panel No results found for: CHOL, TRIG, HDL, CHOLHDL, LDLCALC, LDLDIRECT  Wt Readings from Last 3 Encounters:  11/15/18 300 lb (136.1 kg)  11/02/18 (!) 325 lb (147.4 kg)  01/06/18 293 lb 8 oz (133.1 kg)     Objective:    Vital Signs:  Ht 6\' 1"  (1.854 m)   Wt 300 lb (136.1 kg)   BMI 39.58 kg/m    VITAL SIGNS:  reviewed  ASSESSMENT & PLAN:    1. Angina pectoris:  COVID-19 Education: The signs and symptoms of COVID-19 were discussed with the patient and how to seek care for testing (follow up with PCP or arrange E-visit).  The importance of social distancing was discussed today.  Time:   Today, I have spent 15 minutes with the patient with telehealth technology discussing the above problems.     Medication  Adjustments/Labs and Tests Ordered: Current medicines are reviewed at length with the patient today.  Concerns regarding medicines are outlined above.   Tests Ordered: No orders of the defined types were placed in this encounter.   Medication Changes: No orders of the defined types were placed in this encounter.   Disposition:  Follow up in 3 month(s)  Signed, Jenean Lindau, MD  11/15/2018 1:16 PM    [facility]

## 2018-11-16 NOTE — Addendum Note (Signed)
Addended by: Beckey Rutter on: 11/16/2018 09:21 AM   Modules accepted: Orders

## 2018-11-17 ENCOUNTER — Other Ambulatory Visit: Payer: Self-pay

## 2018-11-17 ENCOUNTER — Ambulatory Visit (HOSPITAL_COMMUNITY)
Admission: RE | Admit: 2018-11-17 | Discharge: 2018-11-17 | Disposition: A | Discharge status: Home / Self Care | Payer: Medicaid Other | Source: Ambulatory Visit | Attending: Cardiology | Admitting: Cardiology

## 2018-11-17 ENCOUNTER — Telehealth: Payer: Self-pay | Admitting: *Deleted

## 2018-11-17 ENCOUNTER — Other Ambulatory Visit (HOSPITAL_COMMUNITY)
Admission: RE | Admit: 2018-11-17 | Discharge: 2018-11-17 | Disposition: A | Discharge status: Home / Self Care | Payer: Medicaid Other | Source: Ambulatory Visit | Attending: Internal Medicine | Admitting: Internal Medicine

## 2018-11-17 DIAGNOSIS — M79604 Pain in right leg: Secondary | ICD-10-CM | POA: Insufficient documentation

## 2018-11-17 DIAGNOSIS — Z1159 Encounter for screening for other viral diseases: Secondary | ICD-10-CM | POA: Insufficient documentation

## 2018-11-17 DIAGNOSIS — M7989 Other specified soft tissue disorders: Secondary | ICD-10-CM | POA: Insufficient documentation

## 2018-11-17 DIAGNOSIS — Z01818 Encounter for other preprocedural examination: Secondary | ICD-10-CM

## 2018-11-17 DIAGNOSIS — M79605 Pain in left leg: Secondary | ICD-10-CM | POA: Diagnosis not present

## 2018-11-17 DIAGNOSIS — M79606 Pain in leg, unspecified: Secondary | ICD-10-CM | POA: Diagnosis not present

## 2018-11-17 DIAGNOSIS — R0602 Shortness of breath: Secondary | ICD-10-CM | POA: Diagnosis not present

## 2018-11-17 LAB — BASIC METABOLIC PANEL
BUN/Creatinine Ratio: 13 (ref 10–24)
BUN: 15 mg/dL (ref 8–27)
CO2: 36 mmol/L — ABNORMAL HIGH (ref 20–29)
Calcium: 9.8 mg/dL (ref 8.6–10.2)
Chloride: 95 mmol/L — ABNORMAL LOW (ref 96–106)
Creatinine, Ser: 1.14 mg/dL (ref 0.76–1.27)
GFR calc Af Amer: 80 mL/min/{1.73_m2} (ref >59)
GFR calc non Af Amer: 70 mL/min/{1.73_m2} (ref >59)
Glucose: 124 mg/dL — ABNORMAL HIGH (ref 65–99)
Potassium: 3.8 mmol/L (ref 3.5–5.2)
Sodium: 140 mmol/L (ref 134–144)

## 2018-11-17 LAB — CBC WITH DIFFERENTIAL/PLATELET
Basophils Absolute: 0 10*3/uL (ref 0.0–0.2)
Basos: 0 %
EOS (ABSOLUTE): 0.3 10*3/uL (ref 0.0–0.4)
Eos: 3 %
Hematocrit: 47 % (ref 37.5–51.0)
Hemoglobin: 15.8 g/dL (ref 13.0–17.7)
Lymphocytes Absolute: 2 10*3/uL (ref 0.7–3.1)
Lymphs: 23 %
MCH: 30.7 pg (ref 26.6–33.0)
MCHC: 33.6 g/dL (ref 31.5–35.7)
MCV: 91 fL (ref 79–97)
Monocytes Absolute: 0.7 10*3/uL (ref 0.1–0.9)
Monocytes: 8 %
Neutrophils Absolute: 5.8 10*3/uL (ref 1.4–7.0)
Neutrophils: 66 %
Platelets: 254 10*3/uL (ref 150–450)
RBC: 5.14 x10E6/uL (ref 4.14–5.80)
RDW: 15 % (ref 11.6–15.4)
WBC: 8.8 10*3/uL (ref 3.4–10.8)

## 2018-11-17 NOTE — Addendum Note (Signed)
Addended by: Beckey Rutter on: 11/17/2018 09:48 AM   Modules accepted: Orders

## 2018-11-17 NOTE — Telephone Encounter (Signed)
Pt contacted pre-catheterization scheduled at [facility] for: Monday November 21, 2018 9 AM Verified arrival time and place: [place] Entrance A at: 7 AM  Covid-19 test date: 11/17/18 [facility] in process  No solid food after midnight prior to cath, clear liquids until 5 AM day of procedure. Contrast allergy: no  Hold: Metolazone -AM of procedure. KCl-AM of procedure.  Except hold medications AM meds can be  taken pre-cath with sip of water including: ASA 81 mg   Confirm patient has responsible person to drive home post procedure and observe 24 hours after arriving home.  Due to Covid-19 pandemic no visitors are allowed in the hospital (unless cognitive impairment).  Their designated party will be called when their procedure is over for an update and to arrange pick up.  Patients are required to wear a mask when they enter the hospital.        COVID-19 Pre-Screening Questions:  . In the past 7 to 10 days have you had a cough,  shortness of breath, headache, congestion, fever (100 or greater) body aches, chills, sore throat, or sudden loss of taste or sense of smell? . Have you been around anyone with known Covid 19. . Have you been around anyone who is awaiting Covid 19 test results in the past 7 to 10 days? . Have you been around anyone who has been exposed to Covid 19, or has mentioned symptoms of Covid 19 within the past 7 to 10 days?   11/17/18-LMTCB to review procedure/mask/visitor/Covid 19 screening questions with patient.

## 2018-11-18 ENCOUNTER — Telehealth: Payer: Self-pay | Admitting: Internal Medicine

## 2018-11-18 ENCOUNTER — Telehealth: Payer: Self-pay

## 2018-11-18 LAB — NOVEL CORONAVIRUS, NAA (HOSP ORDER, SEND-OUT TO REF LAB; TAT 18-24 HRS): SARS-CoV-2, NAA: NOT DETECTED

## 2018-11-18 NOTE — Telephone Encounter (Signed)
° ° °  Please return call to patient °

## 2018-11-18 NOTE — Telephone Encounter (Signed)
New Message   Patient is returning call in reference to [place] lab results. Please call.

## 2018-11-18 NOTE — Telephone Encounter (Signed)
Patient called and notified of lab results. 

## 2018-11-18 NOTE — Telephone Encounter (Signed)
Call placed to Pt.  Advised he was covid negative and all set for his upcoming procedure.  Reminded to stay home this weekend.

## 2018-11-18 NOTE — Telephone Encounter (Signed)
-----   Message from Jenean Lindau, MD sent at 11/17/2018  2:32 PM EDT ----- Labs are excellent.  Please forward them to the Cath Lab .the results of the study is unremarkable. Please inform patient. I will discuss in detail at next appointment. Cc  primary care/referring physician Jenean Lindau, MD 11/17/2018 2:32 PM

## 2018-11-18 NOTE — Telephone Encounter (Signed)
I reviewed pre-cath instructions with patient for 6/8.  He verbalized understanding.

## 2018-11-21 ENCOUNTER — Encounter (HOSPITAL_COMMUNITY)
Admission: RE | Disposition: A | Discharge status: Home / Self Care | Payer: Self-pay | Source: Home / Self Care | Attending: Internal Medicine

## 2018-11-21 ENCOUNTER — Ambulatory Visit (HOSPITAL_COMMUNITY)
Admission: RE | Admit: 2018-11-21 | Discharge: 2018-11-21 | Disposition: A | Discharge status: Home / Self Care | Payer: Medicaid Other | Attending: Internal Medicine | Admitting: Internal Medicine

## 2018-11-21 ENCOUNTER — Encounter (HOSPITAL_COMMUNITY): Payer: Self-pay | Admitting: Internal Medicine

## 2018-11-21 ENCOUNTER — Other Ambulatory Visit: Payer: Self-pay

## 2018-11-21 DIAGNOSIS — Z79899 Other long term (current) drug therapy: Secondary | ICD-10-CM | POA: Diagnosis not present

## 2018-11-21 DIAGNOSIS — I209 Angina pectoris, unspecified: Secondary | ICD-10-CM

## 2018-11-21 DIAGNOSIS — E785 Hyperlipidemia, unspecified: Secondary | ICD-10-CM | POA: Insufficient documentation

## 2018-11-21 DIAGNOSIS — K219 Gastro-esophageal reflux disease without esophagitis: Secondary | ICD-10-CM | POA: Insufficient documentation

## 2018-11-21 DIAGNOSIS — Q600 Renal agenesis, unilateral: Secondary | ICD-10-CM | POA: Diagnosis not present

## 2018-11-21 DIAGNOSIS — G4733 Obstructive sleep apnea (adult) (pediatric): Secondary | ICD-10-CM | POA: Insufficient documentation

## 2018-11-21 DIAGNOSIS — J449 Chronic obstructive pulmonary disease, unspecified: Secondary | ICD-10-CM | POA: Diagnosis not present

## 2018-11-21 DIAGNOSIS — F17210 Nicotine dependence, cigarettes, uncomplicated: Secondary | ICD-10-CM | POA: Diagnosis not present

## 2018-11-21 DIAGNOSIS — Z7951 Long term (current) use of inhaled steroids: Secondary | ICD-10-CM | POA: Insufficient documentation

## 2018-11-21 DIAGNOSIS — M1990 Unspecified osteoarthritis, unspecified site: Secondary | ICD-10-CM | POA: Insufficient documentation

## 2018-11-21 DIAGNOSIS — I10 Essential (primary) hypertension: Secondary | ICD-10-CM | POA: Insufficient documentation

## 2018-11-21 DIAGNOSIS — Z6839 Body mass index (BMI) 39.0-39.9, adult: Secondary | ICD-10-CM | POA: Diagnosis not present

## 2018-11-21 DIAGNOSIS — R739 Hyperglycemia, unspecified: Secondary | ICD-10-CM

## 2018-11-21 DIAGNOSIS — E7800 Pure hypercholesterolemia, unspecified: Secondary | ICD-10-CM

## 2018-11-21 HISTORY — PX: LEFT HEART CATH AND CORONARY ANGIOGRAPHY: CATH118249

## 2018-11-21 SURGERY — LEFT HEART CATH AND CORONARY ANGIOGRAPHY
Anesthesia: LOCAL

## 2018-11-21 MED ORDER — LIDOCAINE HCL (PF) 1 % IJ SOLN
INTRAMUSCULAR | Status: AC
  Filled 2018-11-21: qty 30

## 2018-11-21 MED ORDER — SODIUM CHLORIDE 0.9 % WEIGHT BASED INFUSION
3.0000 mL/kg/h | INTRAVENOUS | Status: AC
  Administered 2018-11-21: 3 mL/kg/h via INTRAVENOUS

## 2018-11-21 MED ORDER — SODIUM CHLORIDE 0.9% FLUSH: 3.0000 mL | Freq: Two times a day (BID) | INTRAVENOUS | Status: DC

## 2018-11-21 MED ORDER — SODIUM CHLORIDE 0.9 % WEIGHT BASED INFUSION: 1.0000 mL/kg/h | INTRAVENOUS | Status: DC

## 2018-11-21 MED ORDER — FENTANYL CITRATE (PF) 100 MCG/2ML IJ SOLN
INTRAMUSCULAR | Status: DC | PRN
  Administered 2018-11-21: 25 ug via INTRAVENOUS

## 2018-11-21 MED ORDER — HEPARIN SODIUM (PORCINE) 1000 UNIT/ML IJ SOLN
INTRAMUSCULAR | Status: DC | PRN
  Administered 2018-11-21: 5000 [IU] via INTRAVENOUS

## 2018-11-21 MED ORDER — SODIUM CHLORIDE 0.9% FLUSH: 3.0000 mL | INTRAVENOUS | Status: DC | PRN

## 2018-11-21 MED ORDER — ASPIRIN 81 MG PO CHEW: 81.0000 mg | CHEWABLE_TABLET | ORAL | Status: DC

## 2018-11-21 MED ORDER — HEPARIN (PORCINE) IN NACL 1000-0.9 UT/500ML-% IV SOLN
INTRAVENOUS | Status: AC
  Filled 2018-11-21: qty 1000

## 2018-11-21 MED ORDER — HYDRALAZINE HCL 20 MG/ML IJ SOLN: 10.0000 mg | INTRAMUSCULAR | Status: DC | PRN

## 2018-11-21 MED ORDER — MIDAZOLAM HCL 2 MG/2ML IJ SOLN
INTRAMUSCULAR | Status: AC
  Filled 2018-11-21: qty 2

## 2018-11-21 MED ORDER — FENTANYL CITRATE (PF) 100 MCG/2ML IJ SOLN
INTRAMUSCULAR | Status: AC
  Filled 2018-11-21: qty 2

## 2018-11-21 MED ORDER — ACETAMINOPHEN 325 MG PO TABS: 650.0000 mg | ORAL_TABLET | ORAL | Status: DC | PRN

## 2018-11-21 MED ORDER — HEPARIN (PORCINE) IN NACL 1000-0.9 UT/500ML-% IV SOLN
INTRAVENOUS | Status: DC | PRN
  Administered 2018-11-21 (×2): 500 mL

## 2018-11-21 MED ORDER — VERAPAMIL HCL 2.5 MG/ML IV SOLN
INTRAVENOUS | Status: DC | PRN
  Administered 2018-11-21: 10:00:00 via INTRA_ARTERIAL

## 2018-11-21 MED ORDER — IOHEXOL 350 MG/ML SOLN
INTRAVENOUS | Status: DC | PRN
  Administered 2018-11-21: 10:00:00 75 mL via INTRAVENOUS

## 2018-11-21 MED ORDER — VERAPAMIL HCL 2.5 MG/ML IV SOLN
INTRAVENOUS | Status: AC
  Filled 2018-11-21: qty 2

## 2018-11-21 MED ORDER — MIDAZOLAM HCL 2 MG/2ML IJ SOLN
INTRAMUSCULAR | Status: DC | PRN
  Administered 2018-11-21: 1 mg via INTRAVENOUS

## 2018-11-21 MED ORDER — LABETALOL HCL 5 MG/ML IV SOLN: 10.0000 mg | INTRAVENOUS | Status: DC | PRN

## 2018-11-21 MED ORDER — ONDANSETRON HCL 4 MG/2ML IJ SOLN: 4.0000 mg | Freq: Four times a day (QID) | INTRAMUSCULAR | Status: DC | PRN

## 2018-11-21 MED ORDER — SODIUM CHLORIDE 0.9 % IV SOLN: 250.0000 mL | INTRAVENOUS | Status: DC | PRN

## 2018-11-21 MED ORDER — LIDOCAINE HCL (PF) 1 % IJ SOLN
INTRAMUSCULAR | Status: DC | PRN
  Administered 2018-11-21: 2 mL

## 2018-11-21 MED ORDER — SODIUM CHLORIDE 0.9 % IV SOLN: INTRAVENOUS | Status: DC

## 2018-11-21 SURGICAL SUPPLY — 12 items
CATH INFINITI 5FR ANG PIGTAIL (CATHETERS) ×2 IMPLANT
CATH INFINITI JR4 5F (CATHETERS) ×2 IMPLANT
CATH OPTITORQUE TIG 4.0 5F (CATHETERS) ×2 IMPLANT
DEVICE RAD COMP TR BAND LRG (VASCULAR PRODUCTS) ×2 IMPLANT
GLIDESHEATH SLEND A-KIT 6F 22G (SHEATH) ×2 IMPLANT
GUIDEWIRE INQWIRE 1.5J.035X260 (WIRE) ×1 IMPLANT
INQWIRE 1.5J .035X260CM (WIRE) ×2
KIT HEART LEFT (KITS) ×2 IMPLANT
PACK CARDIAC CATHETERIZATION (CUSTOM PROCEDURE TRAY) ×2 IMPLANT
SYR MEDRAD MARK 7 150ML (SYRINGE) ×2 IMPLANT
TRANSDUCER W/STOPCOCK (MISCELLANEOUS) ×2 IMPLANT
TUBING CIL FLEX 10 FLL-RA (TUBING) ×2 IMPLANT

## 2018-11-21 NOTE — Interval H&P Note (Signed)
History and Physical Interval Note:  11/21/2018 8:46 AM  Taylor Vargas  has presented today for cardiac catheterization, with the diagnosis of angina.  The various methods of treatment have been discussed with the patient and family. After consideration of risks, benefits and other options for treatment, the patient has consented to  Procedure(s): LEFT HEART CATH AND CORONARY ANGIOGRAPHY (N/A) as a surgical intervention.  The patient's history has been reviewed, patient examined, no change in status, stable for surgery.  I have reviewed the patient's chart and labs.  Questions were answered to the patient's satisfaction.    Cath Lab Visit (complete for each Cath Lab visit)  Clinical Evaluation Leading to the Procedure:   ACS: No.  Non-ACS:    Anginal Classification: CCS III  Anti-ischemic medical therapy: Minimal Therapy (1 class of medications)  Non-Invasive Test Results: No non-invasive testing performed  Prior CABG: No previous CABG   Ardyce Heyer

## 2018-11-21 NOTE — Brief Op Note (Signed)
BRIEF CARDIAC CATHETERIZATION NOTE  DATE: 11/21/2018  TIME: 9:39 AM  PATIENT:  Taylor Vargas  58 y.o. male  PRE-OPERATIVE DIAGNOSIS:  angina  POST-OPERATIVE DIAGNOSIS:  Same  PROCEDURE:  Procedure(s): LEFT HEART CATH AND CORONARY ANGIOGRAPHY (N/A)  SURGEON:  Surgeon(s) and Role:    * Avalene Sealy, Harrell Gave, MD - Primary  FINDINGS: 1. No angiographically significant coronary artery disease. 2. Normal LVEF. 3. Mildly to moderately elevated LVEDP (significang respiratory variation noted).  RECOMMENDATIONS: 1. Primary prevention. 2. Proceed with outpatient echocardiogram, as previously scheduled by Dr. Geraldo Pitter.  Nelva Bush, MD [facility] Pager: [phone]

## 2018-11-21 NOTE — Discharge Instructions (Signed)
Radial Site Care ° °This sheet gives you information about how to care for yourself after your procedure. Your health care provider may also give you more specific instructions. If you have problems or questions, contact your health care provider. °What can I expect after the procedure? °After the procedure, it is common to have: °· Bruising and tenderness at the catheter insertion area. °Follow these instructions at home: °Medicines °· Take over-the-counter and prescription medicines only as told by your health care provider. °Insertion site care °· Follow instructions from your health care provider about how to take care of your insertion site. Make sure you: °? Wash your hands with soap and water before you change your bandage (dressing). If soap and water are not available, use hand sanitizer. °? Change your dressing as told by your health care provider. °? Leave stitches (sutures), skin glue, or adhesive strips in place. These skin closures may need to stay in place for 2 weeks or longer. If adhesive strip edges start to loosen and curl up, you may trim the loose edges. Do not remove adhesive strips completely unless your health care provider tells you to do that. °· Check your insertion site every day for signs of infection. Check for: °? Redness, swelling, or pain. °? Fluid or blood. °? Pus or a bad smell. °? Warmth. °· Do not take baths, swim, or use a hot tub until your health care provider approves. °· You may shower 24-48 hours after the procedure, or as directed by your health care provider. °? Remove the dressing and gently wash the site with plain soap and water. °? Pat the area dry with a clean towel. °? Do not rub the site. That could cause bleeding. °· Do not apply powder or lotion to the site. °Activity ° °· For 24 hours after the procedure, or as directed by your health care provider: °? Do not flex or bend the affected arm. °? Do not push or pull heavy objects with the affected arm. °? Do not  drive yourself home from the hospital or clinic. You may drive 24 hours after the procedure unless your health care provider tells you not to. °? Do not operate machinery or power tools. °· Do not lift anything that is heavier than 10 lb (4.5 kg), or the limit that you are told, until your health care provider says that it is safe. °· Ask your health care provider when it is okay to: °? Return to work or school. °? Resume usual physical activities or sports. °? Resume sexual activity. °General instructions °· If the catheter site starts to bleed, raise your arm and put firm pressure on the site. If the bleeding does not stop, get help right away. This is a medical emergency. °· If you went home on the same day as your procedure, a responsible adult should be with you for the first 24 hours after you arrive home. °· Keep all follow-up visits as told by your health care provider. This is important. °Contact a health care provider if: °· You have a fever. °· You have redness, swelling, or yellow drainage around your insertion site. °Get help right away if: °· You have unusual pain at the radial site. °· The catheter insertion area swells very fast. °· The insertion area is bleeding, and the bleeding does not stop when you hold steady pressure on the area. °· Your arm or hand becomes pale, cool, tingly, or numb. °These symptoms may represent a serious problem   that is an emergency. Do not wait to see if the symptoms will go away. Get medical help right away. Call your local emergency services (911 in the [place] Do not drive yourself to the hospital. °Summary °· After the procedure, it is common to have bruising and tenderness at the site. °· Follow instructions from your health care provider about how to take care of your radial site wound. Check the wound every day for signs of infection. °· Do not lift anything that is heavier than 10 lb (4.5 kg), or the limit that you are told, until your health care provider says  that it is safe. °This information is not intended to replace advice given to you by your health care provider. Make sure you discuss any questions you have with your health care provider. °Document Released: 07/04/2010 Document Revised: 07/07/2017 Document Reviewed: 07/07/2017 °Elsevier Interactive Patient Education © 2019 [employer] ° °

## 2018-11-24 ENCOUNTER — Telehealth: Payer: Self-pay | Admitting: Internal Medicine

## 2018-11-24 ENCOUNTER — Telehealth: Payer: Self-pay | Admitting: *Deleted

## 2018-11-24 NOTE — Telephone Encounter (Signed)
Patient returning call.

## 2018-11-24 NOTE — Telephone Encounter (Signed)
Pt wants to know about the status of the CT Angio ordered on him. I don't see that order. Please advise. Is it not needed due to pt having cath?

## 2018-11-25 ENCOUNTER — Ambulatory Visit (INDEPENDENT_AMBULATORY_CARE_PROVIDER_SITE_OTHER): Payer: Medicaid Other

## 2018-11-25 ENCOUNTER — Other Ambulatory Visit: Payer: Self-pay

## 2018-11-25 DIAGNOSIS — R0609 Other forms of dyspnea: Secondary | ICD-10-CM | POA: Diagnosis not present

## 2018-11-25 MED ORDER — POTASSIUM CHLORIDE ER 20 MEQ PO TBCR: 40.0000 meq | EXTENDED_RELEASE_TABLET | Freq: Every day | ORAL | 3 refills | Status: DC

## 2018-11-25 NOTE — Addendum Note (Signed)
Addended by: Beckey Rutter on: 11/25/2018 03:53 PM   Modules accepted: Orders

## 2018-11-25 NOTE — Telephone Encounter (Signed)
Pt informed that ct angio was cancelled. He will be in office today for echo and requested earlier f/u with Dr. Docia Furl. Schedule changed and patient given a copy.

## 2018-11-25 NOTE — Progress Notes (Signed)
Echocardiogram complete, results are located in CV Proc.  Darlina Sicilian

## 2018-11-28 NOTE — Telephone Encounter (Signed)
Taylor Vargas patient. Not sure why this call came to our triage.  However, the [place] office staff spoke with the patient the same day regarding his cardiac CT.   Could find no other notation where someone from our office tried to call him.  Will close this encounter.

## 2018-11-29 ENCOUNTER — Telehealth: Payer: Self-pay

## 2018-11-29 NOTE — Telephone Encounter (Signed)
-----   Message from Jenean Lindau, MD sent at 11/25/2018  4:12 PM EDT ----- The results of the study is unremarkable. Please inform patient. I will discuss in detail at next appointment. Cc  primary care/referring physician Jenean Lindau, MD 11/25/2018 4:12 PM

## 2018-11-29 NOTE — Telephone Encounter (Signed)
Called and left detailed voice message on patients phone regarding test results. 

## 2018-12-01 ENCOUNTER — Other Ambulatory Visit: Payer: Self-pay

## 2018-12-01 ENCOUNTER — Encounter: Payer: Self-pay | Admitting: Cardiology

## 2018-12-01 ENCOUNTER — Telehealth: Payer: Self-pay | Admitting: Cardiology

## 2018-12-01 ENCOUNTER — Telehealth (INDEPENDENT_AMBULATORY_CARE_PROVIDER_SITE_OTHER): Payer: Medicaid Other | Admitting: Cardiology

## 2018-12-01 VITALS — Ht 73.0 in | Wt 308.0 lb

## 2018-12-01 DIAGNOSIS — E7800 Pure hypercholesterolemia, unspecified: Secondary | ICD-10-CM

## 2018-12-01 DIAGNOSIS — J431 Panlobular emphysema: Secondary | ICD-10-CM | POA: Diagnosis not present

## 2018-12-01 DIAGNOSIS — M79606 Pain in leg, unspecified: Secondary | ICD-10-CM

## 2018-12-01 DIAGNOSIS — R0609 Other forms of dyspnea: Secondary | ICD-10-CM

## 2018-12-01 NOTE — Addendum Note (Signed)
Addended by: Beckey Rutter on: 12/01/2018 11:25 AM   Modules accepted: Orders

## 2018-12-01 NOTE — Progress Notes (Signed)
Virtual Visit via Video Note   This visit type was conducted due to national recommendations for restrictions regarding the COVID-19 Pandemic (e.g. social distancing) in an effort to limit this patient's exposure and mitigate transmission in our community.  Due to his co-morbid illnesses, this patient is at least at moderate risk for complications without adequate follow up.  This format is felt to be most appropriate for this patient at this time.  All issues noted in this document were discussed and addressed.  A limited physical exam was performed with this format.  Please refer to the patient's chart for his consent to telehealth for [facility].   Date:  12/01/2018   ID:  Taylor Vargas, DOB March 25, 1961, MRN [id number]  Patient Location: Home Provider Location: Home  PCP:  Street, Sharon Mt, MD  Cardiologist:  No primary care provider on file.  Electrophysiologist:  None   Evaluation Performed:  Follow-Up Visit  Chief Complaint: Post coronary angiography  History of Present Illness:    Taylor Vargas is a 58 y.o. male with past medical history of morbid obesity.  Patient has COPD and uses oxygen.  He underwent coronary angiography and this was largely unremarkable.  He complains of chest tightness at times.  He is under the care of a pulmonologist.  No chest pain orthopnea or PND.  He does have persistent shortness of breath on exertion for very long.  Of time.  The patient does not have symptoms concerning for COVID-19 infection (fever, chills, cough, or new shortness of breath).    Past Medical History:  Diagnosis Date  . Arthritis   . Congenital absence of one kidney    01-04-2018 per pt unsure which one  . COPD (chronic obstructive pulmonary disease) ([place])    pulmologist-  dr Alcide Clever [name])  . GERD (gastroesophageal reflux disease)   . On supplemental oxygen therapy    01-04-2018  per pt prescribed as needed ,  states checks oxygen level daily,  and average between 88-90%  on room air,  per pt has not needed O2 past 4 months  . OSA on CPAP   . Pancreatic cancer ([place])    01-04-2018  per pt dx 10/ 2009 found by CT imaging, no surgery,  treatment chemotherapy  @ [facility],  in remission since 2017   Past Surgical History:  Procedure Laterality Date  . COLONOSCOPY  last one 2019  . KNEE ARTHROSCOPY Right 2002  . KNEE ARTHROSCOPY WITH MEDIAL MENISECTOMY Left 01/06/2018   Procedure: LEFT KNEE ARTHROSCOPY WITH MEDIAL MENISECTOMY;  Surgeon: Rod Can, MD;  Location: [place];  Service: Orthopedics;  Laterality: Left;  . LEFT HEART CATH AND CORONARY ANGIOGRAPHY N/A 11/21/2018   Procedure: LEFT HEART CATH AND CORONARY ANGIOGRAPHY;  Surgeon: Nelva Bush, MD;  Location: [place] CV [facility];  Service: Cardiovascular;  Laterality: N/A;     Current Meds  Medication Sig  . albuterol (ACCUNEB) 0.63 MG/3ML nebulizer solution Take 1 ampule by nebulization every 6 (six) hours as needed for wheezing.   . Albuterol Sulfate (PROAIR HFA IN) Inhale into the lungs.  . ALPRAZolam (XANAX) 0.25 MG tablet Take 0.25 mg by mouth 3 (three) times daily as needed.   . benzonatate (TESSALON) 200 MG capsule Take 200 mg by mouth 3 (three) times daily as needed for cough.  Marland Kitchen ibuprofen (ADVIL,MOTRIN) 200 MG tablet Take 200 mg by mouth every 6 (six) hours as needed.  . metolazone (ZAROXOLYN) 2.5 MG tablet Take 1 tablet (2.5 mg  total) by mouth daily.  . montelukast (SINGULAIR) 10 MG tablet Take 10 mg by mouth at bedtime.  . nitroGLYCERIN (NITROSTAT) 0.4 MG SL tablet Place 1 tablet (0.4 mg total) under the tongue every 5 (five) minutes as needed.  . Omeprazole 20 MG TBEC Take 20 mg by mouth every morning.   . Potassium Chloride ER 20 MEQ TBCR Take 40 mEq by mouth daily.  . Tiotropium Bromide-Olodaterol (STIOLTO RESPIMAT) 2.5-2.5 MCG/ACT AERS Inhale into the lungs every morning.   . vitamin C (ASCORBIC ACID) 500 MG tablet Take 500 mg by mouth daily.     Allergies:   Patient has  no known allergies.   Social History   Tobacco Use  . Smoking status: Current Every Day Smoker    Years: 31.00    Types: Cigarettes  . Smokeless tobacco: Never Used  . Tobacco comment: 01-04-2018  per pt down from 1/2 ppd down to 3-4 cig daily  Substance Use Topics  . Alcohol use: Yes    Frequency: Never    Comment: very rare  . Drug use: Never     Family Hx: The patient's family history is not on file.  ROS:   Please see the history of present illness.    As mentioned above All other systems reviewed and are negative.   Prior CV studies:   The following studies were reviewed today:  LEFT HEART CATH AND CORONARY ANGIOGRAPHY  Conclusion  Conclusions: 1. No angiographically significant coronary artery disease. 2. Normal left ventricular systolic function. 3. Mildly to moderately elevated LVEDP (significang respiratory variation noted).  Recommendations: 1. Primary prevention of coronary artery disease. 2. Proceed with outpatient echocardiogram, as previously scheduled by Dr. Geraldo Pitter.      Labs/Other Tests and Data Reviewed:    EKG:  No ECG reviewed.  Recent Labs: 11/08/2018: NT-Pro BNP 40 11/17/2018: BUN 15; Creatinine, Ser 1.14; Hemoglobin 15.8; Platelets 254; Potassium 3.8; Sodium 140   Recent Lipid Panel No results found for: CHOL, TRIG, HDL, CHOLHDL, LDLCALC, LDLDIRECT  Wt Readings from Last 3 Encounters:  12/01/18 (!) 308 lb (139.7 kg)  11/21/18 300 lb (136.1 kg)  11/15/18 300 lb (136.1 kg)     Objective:    Vital Signs:  Ht 6\' 1"  (1.854 m)   Wt (!) 308 lb (139.7 kg)   BMI 40.64 kg/m    VITAL SIGNS:  reviewed  ASSESSMENT & PLAN:    1. Dyspnea on exertion: It is a relief to know that his coronary angiography was unremarkable..  I discussed my findings with the patient extensively.  In view of the fact that he continues to be on diuretic therapy I will do a Chem-7 today.  He is very happy that his swelling is better.  In view of his shortness  of breath I would do a d-dimer to assess for any other etiologies of shortness of breath such as pulmonary thromboembolism. 2. Morbid obesity: Diet was discussed with him at extensive length. 3. He will be seen in follow-up appointment in 2 to 3 weeks or earlier if he has any concerns.  Patient had multiple questions which were answered to his satisfaction.  COVID-19 Education: The signs and symptoms of COVID-19 were discussed with the patient and how to seek care for testing (follow up with PCP or arrange E-visit).  The importance of social distancing was discussed today.  Time:   Today, I have spent 15 minutes with the patient with telehealth technology discussing the above problems.  Medication Adjustments/Labs and Tests Ordered: Current medicines are reviewed at length with the patient today.  Concerns regarding medicines are outlined above.   Tests Ordered: No orders of the defined types were placed in this encounter.   Medication Changes: No orders of the defined types were placed in this encounter.   Follow Up:  Virtual Visit or In Person in 6 month(s)  Signed, Jenean Lindau, MD  12/01/2018 10:53 AM    [place]

## 2018-12-01 NOTE — Patient Instructions (Signed)
Medication Instructions:  Your physician recommends that you continue on your current medications as directed. Please refer to the Current Medication list given to you today.  If you need a refill on your cardiac medications before your next appointment, please call your pharmacy.   Lab work: Your physician recommends that you return for lab work tomorrow for a D-Dimer and BMP.  If you have labs (blood work) drawn today and your tests are completely normal, you will receive your results only by: Marland Kitchen MyChart Message (if you have MyChart) OR . A paper copy in the mail If you have any lab test that is abnormal or we need to change your treatment, we will call you to review the results.  Testing/Procedures: NONE  Follow-Up: At [facility], you and your health needs are our priority.  As part of our continuing mission to provide you with exceptional heart care, we have created designated Provider Care Teams.  These Care Teams include your primary Cardiologist (physician) and Advanced Practice Providers (APPs -  Physician Assistants and Nurse Practitioners) who all work together to provide you with the care you need, when you need it. You will need a follow up appointment in 2 weeks.

## 2018-12-01 NOTE — Telephone Encounter (Signed)
Virtual Visit Pre-Appointment Phone Call  "(Name), I am calling you today to discuss your upcoming appointment. We are currently trying to limit exposure to the virus that causes COVID-19 by seeing patients at home rather than in the office."  1. "What is the BEST phone number to call the day of the visit?" - include this in appointment notes  2. Do you have or have access to (through a family member/friend) a smartphone with video capability that we can use for your visit?" a. If yes - list this number in appt notes as cell (if different from BEST phone #) and list the appointment type as a VIDEO visit in appointment notes b. If no - list the appointment type as a PHONE visit in appointment notes  3. Confirm consent - "In the setting of the current Covid19 crisis, you are scheduled for a (phone or video) visit with your provider on (date) at (time).  Just as we do with many in-office visits, in order for you to participate in this visit, we must obtain consent.  If you'd like, I can send this to your mychart (if signed up) or email for you to review.  Otherwise, I can obtain your verbal consent now.  All virtual visits are billed to your insurance company just like a normal visit would be.  By agreeing to a virtual visit, we'd like you to understand that the technology does not allow for your provider to perform an examination, and thus may limit your provider's ability to fully assess your condition. If your provider identifies any concerns that need to be evaluated in person, we will make arrangements to do so.  Finally, though the technology is pretty good, we cannot assure that it will always work on either your or our end, and in the setting of a video visit, we may have to convert it to a phone-only visit.  In either situation, we cannot ensure that we have a secure connection.  Are you willing to proceed?" STAFF: Did the patient verbally acknowledge consent to telehealth visit? Document  YES/NO here: YES  4. Advise patient to be prepared - "Two hours prior to your appointment, go ahead and check your blood pressure, pulse, oxygen saturation, and your weight (if you have the equipment to check those) and write them all down. When your visit starts, your provider will ask you for this information. If you have an Apple Watch or Kardia device, please plan to have heart rate information ready on the day of your appointment. Please have a pen and paper handy nearby the day of the visit as well."  5. Give patient instructions for MyChart download to smartphone OR Doximity/Doxy.me as below if video visit (depending on what platform provider is using)  6. Inform patient they will receive a phone call 15 minutes prior to their appointment time (may be from unknown caller ID) so they should be prepared to answer    TELEPHONE CALL NOTE  Taylor Vargas has been deemed a candidate for a follow-up tele-health visit to limit community exposure during the Covid-19 pandemic. I spoke with the patient via phone to ensure availability of phone/video source, confirm preferred email & phone number, and discuss instructions and expectations.  I reminded Taylor Vargas to be prepared with any vital sign and/or heart rhythm information that could potentially be obtained via home monitoring, at the time of his visit. I reminded Taylor Vargas to expect a phone call prior to his visit.  Frederic Jericho 12/01/2018 9:07 AM   INSTRUCTIONS FOR DOWNLOADING THE MYCHART APP TO SMARTPHONE  - The patient must first make sure to have activated MyChart and know their login information - If Apple, go to [employer] and type in MyChart in the search bar and download the app. If Android, ask patient to go to [employer] and type in [place] in the search bar and download the app. The app is free but as with any other app downloads, their phone may require them to verify saved payment information or Apple/Android password.  - The  patient will need to then log into the app with their MyChart username and password, and select [facility] as their healthcare provider to link the account. When it is time for your visit, go to the MyChart app, find appointments, and click Begin Video Visit. Be sure to Select Allow for your device to access the Microphone and Camera for your visit. You will then be connected, and your provider will be with you shortly.  **If they have any issues connecting, or need assistance please contact MyChart service desk [phone])**  **If using a computer, in order to ensure the best quality for their visit they will need to use either of the following Internet Browsers: [employer], or Google Chrome**  IF USING DOXIMITY or DOXY.ME - The patient will receive a link just prior to their visit by text.     FULL LENGTH CONSENT FOR TELE-HEALTH VISIT   I hereby voluntarily request, consent and authorize [facility] and its employed or contracted physicians, physician assistants, nurse practitioners or other licensed health care professionals (the Practitioner), to provide me with telemedicine health care services (the Services") as deemed necessary by the treating Practitioner. I acknowledge and consent to receive the Services by the Practitioner via telemedicine. I understand that the telemedicine visit will involve communicating with the Practitioner through live audiovisual communication technology and the disclosure of certain medical information by electronic transmission. I acknowledge that I have been given the opportunity to request an in-person assessment or other available alternative prior to the telemedicine visit and am voluntarily participating in the telemedicine visit.  I understand that I have the right to withhold or withdraw my consent to the use of telemedicine in the course of my care at any time, without affecting my right to future care or treatment, and that the  Practitioner or I may terminate the telemedicine visit at any time. I understand that I have the right to inspect all information obtained and/or recorded in the course of the telemedicine visit and may receive copies of available information for a reasonable fee.  I understand that some of the potential risks of receiving the Services via telemedicine include:   Delay or interruption in medical evaluation due to technological equipment failure or disruption;  Information transmitted may not be sufficient (e.g. poor resolution of images) to allow for appropriate medical decision making by the Practitioner; and/or   In rare instances, security protocols could fail, causing a breach of personal health information.  Furthermore, I acknowledge that it is my responsibility to provide information about my medical history, conditions and care that is complete and accurate to the best of my ability. I acknowledge that Practitioner's advice, recommendations, and/or decision may be based on factors not within their control, such as incomplete or inaccurate data provided by me or distortions of diagnostic images or specimens that may result from electronic transmissions. I understand that the  practice of medicine is not an Chief Strategy Officer and that Practitioner makes no warranties or guarantees regarding treatment outcomes. I acknowledge that I will receive a copy of this consent concurrently upon execution via email to the email address I last provided but may also request a printed copy by calling the office of [facility].    I understand that my insurance will be billed for this visit.   I have read or had this consent read to me.  I understand the contents of this consent, which adequately explains the benefits and risks of the Services being provided via telemedicine.   I have been provided ample opportunity to ask questions regarding this consent and the Services and have had my questions answered to my  satisfaction.  I give my informed consent for the services to be provided through the use of telemedicine in my medical care  By participating in this telemedicine visit I agree to the above.

## 2018-12-02 ENCOUNTER — Telehealth: Payer: Self-pay

## 2018-12-02 LAB — BASIC METABOLIC PANEL
BUN/Creatinine Ratio: 13 (ref 10–24)
BUN: 15 mg/dL (ref 8–27)
CO2: 31 mmol/L — ABNORMAL HIGH (ref 20–29)
Calcium: 9.6 mg/dL (ref 8.6–10.2)
Chloride: 94 mmol/L — ABNORMAL LOW (ref 96–106)
Creatinine, Ser: 1.15 mg/dL (ref 0.76–1.27)
GFR calc Af Amer: 80 mL/min/{1.73_m2} (ref >59)
GFR calc non Af Amer: 69 mL/min/{1.73_m2} (ref >59)
Glucose: 113 mg/dL — ABNORMAL HIGH (ref 65–99)
Potassium: 4.1 mmol/L (ref 3.5–5.2)
Sodium: 136 mmol/L (ref 134–144)

## 2018-12-02 LAB — D-DIMER, QUANTITATIVE: D-DIMER: 0.39 mg/L FEU (ref 0.00–0.49)

## 2018-12-02 NOTE — Telephone Encounter (Signed)
Patient returned my call, informed that Dr. Geraldo Pitter says his labs are generally good and that he will discuss them more at patient's next office visit on 12/19/18. Patient verbalizes understanding and has no further questions.

## 2018-12-02 NOTE — Telephone Encounter (Signed)
Dr. Geraldo Pitter requested patient be called and informed of recent lab results. Phoned and left voice message requesting return call.

## 2018-12-07 ENCOUNTER — Telehealth: Payer: Self-pay

## 2018-12-07 NOTE — Telephone Encounter (Signed)
-----   Message from Jenean Lindau, MD sent at 12/05/2018  8:18 AM EDT ----- The results of the study is unremarkable. Please inform patient. I will discuss in detail at next appointment. [facility]  primary care/referring physician Jenean Lindau, MD 12/05/2018 8:15 AM

## 2018-12-07 NOTE — Telephone Encounter (Signed)
Patient called and notified of lab results. 

## 2018-12-15 DIAGNOSIS — D72829 Elevated white blood cell count, unspecified: Secondary | ICD-10-CM

## 2018-12-19 ENCOUNTER — Other Ambulatory Visit: Payer: Self-pay

## 2018-12-19 ENCOUNTER — Encounter: Payer: Self-pay | Admitting: Cardiology

## 2018-12-19 ENCOUNTER — Telehealth (INDEPENDENT_AMBULATORY_CARE_PROVIDER_SITE_OTHER): Payer: Medicaid Other | Admitting: Cardiology

## 2018-12-19 VITALS — Ht 73.0 in | Wt 318.0 lb

## 2018-12-19 DIAGNOSIS — J431 Panlobular emphysema: Secondary | ICD-10-CM | POA: Diagnosis not present

## 2018-12-19 NOTE — Progress Notes (Signed)
Virtual Visit via Video Note   This visit type was conducted due to national recommendations for restrictions regarding the COVID-19 Pandemic (e.g. social distancing) in an effort to limit this patient's exposure and mitigate transmission in our community.  Due to his co-morbid illnesses, this patient is at least at moderate risk for complications without adequate follow up.  This format is felt to be most appropriate for this patient at this time.  All issues noted in this document were discussed and addressed.  A limited physical exam was performed with this format.  Please refer to the patient's chart for his consent to telehealth for [facility].   Date:  12/19/2018   ID:  Taylor Vargas, DOB 1961-01-29, MRN [id number]  Patient Location: Home Provider Location: Office  PCP:  Street, Sharon Mt, MD  Cardiologist:  No primary care provider on file.  Electrophysiologist:  None   Evaluation Performed:  Follow-Up Visit  Chief Complaint: Dyspnea on exertion  History of Present Illness:    Taylor Vargas is a 58 y.o. male with past medical history of morbid obesity, COPD.  Patient is here for follow-up.  He is evaluated by virtual visit by video conference.  He denies any chest pain orthopnea or PND.  His blood pressure is stable and he is edema issues are much better.  He is under the care of pulmonologist also for his pulmonary issues such as shortness of breath and COPD.  At the time of my evaluation, the patient is alert awake oriented and in no distress.  The patient does not have symptoms concerning for COVID-19 infection (fever, chills, cough, or new shortness of breath).    Past Medical History:  Diagnosis Date  . Arthritis   . Congenital absence of one kidney    01-04-2018 per pt unsure which one  . COPD (chronic obstructive pulmonary disease) ([place])    pulmologist-  dr Alcide Clever [name])  . GERD (gastroesophageal reflux disease)   . On supplemental oxygen therapy    01-04-2018   per pt prescribed as needed ,  states checks oxygen level daily,  and average between 88-90% on room air,  per pt has not needed O2 past 4 months  . OSA on CPAP   . Pancreatic cancer ([place])    01-04-2018  per pt dx 10/ 2009 found by CT imaging, no surgery,  treatment chemotherapy  @ [facility],  in remission since 2017   Past Surgical History:  Procedure Laterality Date  . COLONOSCOPY  last one 2019  . KNEE ARTHROSCOPY Right 2002  . KNEE ARTHROSCOPY WITH MEDIAL MENISECTOMY Left 01/06/2018   Procedure: LEFT KNEE ARTHROSCOPY WITH MEDIAL MENISECTOMY;  Surgeon: Rod Can, MD;  Location: [place];  Service: Orthopedics;  Laterality: Left;  . LEFT HEART CATH AND CORONARY ANGIOGRAPHY N/A 11/21/2018   Procedure: LEFT HEART CATH AND CORONARY ANGIOGRAPHY;  Surgeon: Nelva Bush, MD;  Location: [place] [facility];  Service: Cardiovascular;  Laterality: N/A;     Current Meds  Medication Sig  . albuterol (ACCUNEB) 0.63 MG/3ML nebulizer solution Take 1 ampule by nebulization every 6 (six) hours as needed for wheezing.   . Albuterol Sulfate (PROAIR HFA IN) Inhale into the lungs.  . ALPRAZolam (XANAX) 0.25 MG tablet Take 0.25 mg by mouth 3 (three) times daily as needed.   . benzonatate (TESSALON) 200 MG capsule Take 200 mg by mouth 3 (three) times daily as needed for cough.  Marland Kitchen ibuprofen (ADVIL,MOTRIN) 200 MG tablet Take 200 mg  by mouth every 6 (six) hours as needed.  . metolazone (ZAROXOLYN) 2.5 MG tablet Take 1 tablet (2.5 mg total) by mouth daily.  . montelukast (SINGULAIR) 10 MG tablet Take 10 mg by mouth at bedtime.  . nitroGLYCERIN (NITROSTAT) 0.4 MG SL tablet Place 1 tablet (0.4 mg total) under the tongue every 5 (five) minutes as needed.  . Omeprazole 20 MG TBEC Take 20 mg by mouth every morning.   . Potassium Chloride ER 20 MEQ TBCR Take 40 mEq by mouth daily.  . Tiotropium Bromide-Olodaterol (STIOLTO RESPIMAT) 2.5-2.5 MCG/ACT AERS Inhale into the lungs every morning.   .  vitamin C (ASCORBIC ACID) 500 MG tablet Take 500 mg by mouth daily.     Allergies:   Patient has no known allergies.   Social History   Tobacco Use  . Smoking status: Current Every Day Smoker    Years: 31.00    Types: Cigarettes  . Smokeless tobacco: Never Used  . Tobacco comment: 01-04-2018  per pt down from 1/2 ppd down to 3-4 cig daily  Substance Use Topics  . Alcohol use: Yes    Frequency: Never    Comment: very rare  . Drug use: Never     Family Hx: The patient's family history is not on file.  ROS:   Please see the history of present illness.    As mentioned above. All other systems reviewed and are negative.   Prior CV studies:   The following studies were reviewed today:  Coronary angiography and echocardiogram report were reviewed  Labs/Other Tests and Data Reviewed:    EKG:  No ECG reviewed.  Recent Labs: 11/08/2018: NT-Pro BNP 40 11/17/2018: Hemoglobin 15.8; Platelets 254 12/02/2018: BUN 15; Creatinine, Ser 1.15; Potassium 4.1; Sodium 136   Recent Lipid Panel No results found for: CHOL, TRIG, HDL, CHOLHDL, LDLCALC, LDLDIRECT  Wt Readings from Last 3 Encounters:  12/19/18 (!) 318 lb (144.2 kg)  12/01/18 (!) 308 lb (139.7 kg)  11/21/18 300 lb (136.1 kg)     Objective:    Vital Signs:  Ht 6\' 1"  (1.854 m)   Wt (!) 318 lb (144.2 kg)   BMI 41.96 kg/m    VITAL SIGNS:  reviewed  ASSESSMENT & PLAN:    1. Dyspnea on exertion: I discussed my findings with the patient at extensive length.  Coronary angiography is unremarkable and I told him that his d-dimer was fine and I was happy with it. 2. Essential hypertension: Blood pressure stable.  We will have a Chem-7 done in the next day or 2 in view of the fact that he is on diuretic therapy 3. Morbid obesity: Weight reduction was stressed.  I had an extensive discussion with him and he promises to comply.  He says he is already lost some weight and is working and is doing best to achieve his goal. 4. Patient  will be seen in follow-up appointment in 4 months or earlier if the patient has any concerns   COVID-19 Education: The signs and symptoms of COVID-19 were discussed with the patient and how to seek care for testing (follow up with PCP or arrange E-visit).  The importance of social distancing was discussed today.  Time:   Today, I have spent 15 minutes with the patient with telehealth technology discussing the above problems.     Medication Adjustments/Labs and Tests Ordered: Current medicines are reviewed at length with the patient today.  Concerns regarding medicines are outlined above.   Tests Ordered: No  orders of the defined types were placed in this encounter.   Medication Changes: No orders of the defined types were placed in this encounter.   Follow Up:  Virtual Visit or In Person in 4 month(s)  Signed, Jenean Lindau, MD  12/19/2018 11:41 AM    [place]

## 2018-12-19 NOTE — Patient Instructions (Signed)
Medication Instructions:  Your physician recommends that you continue on your current medications as directed. Please refer to the Current Medication list given to you today.  If you need a refill on your cardiac medications before your next appointment, please call your pharmacy.   Lab work: Your physician recommends that you return for BMP to be drawn.  If you have labs (blood work) drawn today and your tests are completely normal, you will receive your results only by: Marland Kitchen MyChart Message (if you have MyChart) OR . A paper copy in the mail If you have any lab test that is abnormal or we need to change your treatment, we will call you to review the results.  Testing/Procedures: NONE  Follow-Up: At [facility], you and your health needs are our priority.  As part of our continuing mission to provide you with exceptional heart care, we have created designated Provider Care Teams.  These Care Teams include your primary Cardiologist (physician) and Advanced Practice Providers (APPs -  Physician Assistants and Nurse Practitioners) who all work together to provide you with the care you need, when you need it. You will need a follow up appointment in 4 months.

## 2018-12-20 LAB — BASIC METABOLIC PANEL
BUN/Creatinine Ratio: 10 (ref 10–24)
BUN: 13 mg/dL (ref 8–27)
CO2: 29 mmol/L (ref 20–29)
Calcium: 9.5 mg/dL (ref 8.6–10.2)
Chloride: 93 mmol/L — ABNORMAL LOW (ref 96–106)
Creatinine, Ser: 1.3 mg/dL — ABNORMAL HIGH (ref 0.76–1.27)
GFR calc Af Amer: 69 mL/min/{1.73_m2} (ref >59)
GFR calc non Af Amer: 59 mL/min/{1.73_m2} — ABNORMAL LOW (ref >59)
Glucose: 113 mg/dL — ABNORMAL HIGH (ref 65–99)
Potassium: 3.5 mmol/L (ref 3.5–5.2)
Sodium: 138 mmol/L (ref 134–144)

## 2018-12-28 ENCOUNTER — Telehealth: Payer: Self-pay

## 2018-12-28 DIAGNOSIS — R0609 Other forms of dyspnea: Secondary | ICD-10-CM

## 2018-12-28 DIAGNOSIS — R739 Hyperglycemia, unspecified: Secondary | ICD-10-CM

## 2018-12-28 NOTE — Telephone Encounter (Signed)
-----   Message from Jenean Lindau, MD sent at 12/20/2018  1:27 PM EDT ----- Repeat in 2 weeks.  The results of the study is unremarkable. Please inform patient. I will discuss in detail at next appointment. Cc  primary care/referring physician Jenean Lindau, MD 12/20/2018 1:27 PM

## 2018-12-28 NOTE — Telephone Encounter (Signed)
Phoned patient, explained that Dr. Lennox Pippins says lab results are unremarkable and would like to repeat labs in 2 weeks. Patient will come to [place] office for lab draw next Friday or Monday. No further questions or concerns.

## 2019-01-09 ENCOUNTER — Other Ambulatory Visit: Payer: Self-pay

## 2019-01-09 DIAGNOSIS — Z01818 Encounter for other preprocedural examination: Secondary | ICD-10-CM

## 2019-01-09 DIAGNOSIS — R0609 Other forms of dyspnea: Secondary | ICD-10-CM

## 2019-01-09 DIAGNOSIS — R739 Hyperglycemia, unspecified: Secondary | ICD-10-CM

## 2019-01-10 LAB — CBC
Hematocrit: 50.8 % (ref 37.5–51.0)
Hemoglobin: 16.9 g/dL (ref 13.0–17.7)
MCH: 30.2 pg (ref 26.6–33.0)
MCHC: 33.3 g/dL (ref 31.5–35.7)
MCV: 91 fL (ref 79–97)
Platelets: 314 10*3/uL (ref 150–450)
RBC: 5.59 x10E6/uL (ref 4.14–5.80)
RDW: 13.5 % (ref 11.6–15.4)
WBC: 13.6 10*3/uL — ABNORMAL HIGH (ref 3.4–10.8)

## 2019-01-10 LAB — BASIC METABOLIC PANEL
BUN/Creatinine Ratio: 11 (ref 10–24)
BUN: 13 mg/dL (ref 8–27)
CO2: 25 mmol/L (ref 20–29)
Calcium: 9.4 mg/dL (ref 8.6–10.2)
Chloride: 93 mmol/L — ABNORMAL LOW (ref 96–106)
Creatinine, Ser: 1.15 mg/dL (ref 0.76–1.27)
GFR calc Af Amer: 80 mL/min/{1.73_m2} (ref >59)
GFR calc non Af Amer: 69 mL/min/{1.73_m2} (ref >59)
Glucose: 94 mg/dL (ref 65–99)
Potassium: 3.5 mmol/L (ref 3.5–5.2)
Sodium: 137 mmol/L (ref 134–144)

## 2019-01-12 ENCOUNTER — Telehealth: Payer: Self-pay

## 2019-01-12 NOTE — Telephone Encounter (Signed)
-----   Message from Rajan R Revankar, MD sent at 01/10/2019  9:54 AM EDT ----- The results of the study is unremarkable. Please inform patient. I will discuss in detail at next appointment. Cc  primary care/referring physician Rajan R Revankar, MD 01/10/2019 9:54 AM 

## 2019-01-12 NOTE — Telephone Encounter (Signed)
Left message of good results, patient instructed to call office back if he has further questions. Copy sent to Dr. Venetia Maxon per Dr. Docia Furl.

## 2019-01-17 DIAGNOSIS — Z01818 Encounter for other preprocedural examination: Secondary | ICD-10-CM | POA: Diagnosis not present

## 2019-02-22 ENCOUNTER — Telehealth: Payer: Medicaid Other | Admitting: Cardiology

## 2019-03-06 ENCOUNTER — Other Ambulatory Visit: Payer: Self-pay | Admitting: Cardiology

## 2019-04-21 ENCOUNTER — Encounter: Payer: Self-pay | Admitting: Cardiology

## 2019-04-21 ENCOUNTER — Telehealth (INDEPENDENT_AMBULATORY_CARE_PROVIDER_SITE_OTHER): Payer: Medicaid Other | Admitting: Cardiology

## 2019-04-21 ENCOUNTER — Other Ambulatory Visit: Payer: Self-pay

## 2019-04-21 DIAGNOSIS — I5032 Chronic diastolic (congestive) heart failure: Secondary | ICD-10-CM

## 2019-04-21 HISTORY — DX: Chronic diastolic (congestive) heart failure: I50.32

## 2019-04-21 NOTE — Patient Instructions (Signed)
Medication Instructions:  Your physician recommends that you continue on your current medications as directed. Please refer to the Current Medication list given to you today.  *If you need a refill on your cardiac medications before your next appointment, please call your pharmacy*  Lab Work: Your physician recommends that you return for a BMP to be drawn  If you have labs (blood work) drawn today and your tests are completely normal, you will receive your results only by: Marland Kitchen MyChart Message (if you have MyChart) OR . A paper copy in the mail If you have any lab test that is abnormal or we need to change your treatment, we will call you to review the results.  Testing/Procedures: NONE  Follow-Up: At [facility], you and your health needs are our priority.  As part of our continuing mission to provide you with exceptional heart care, we have created designated Provider Care Teams.  These Care Teams include your primary Cardiologist (physician) and Advanced Practice Providers (APPs -  Physician Assistants and Nurse Practitioners) who all work together to provide you with the care you need, when you need it.  Your next appointment:   6 months  The format for your next appointment:   In Person  Provider:   Jyl Heinz, MD

## 2019-04-21 NOTE — Progress Notes (Signed)
Virtual Visit via Video Note   This visit type was conducted due to national recommendations for restrictions regarding the COVID-19 Pandemic (e.g. social distancing) in an effort to limit this patient's exposure and mitigate transmission in our community.  Due to his co-morbid illnesses, this patient is at least at moderate risk for complications without adequate follow up.  This format is felt to be most appropriate for this patient at this time.  All issues noted in this document were discussed and addressed.  A limited physical exam was performed with this format.  Please refer to the patient's chart for his consent to telehealth for [facility].   Date:  04/21/2019   ID:  Taylor Vargas, DOB [birth year], MRN [id number]  Patient Location: Home Provider Location: Office  PCP:  Street, Sharon Mt, MD  Cardiologist:  No primary care provider on file.  Electrophysiologist:  None   Evaluation Performed:  Follow-Up Visit  Chief Complaint: Shortness of breath  History of Present Illness:    Taylor Vargas is a 58 y.o. male with past medical history of diastolic congestive heart failure.  Patient is on diuretic therapy.  He is morbidly obese.  He denies any chest pain orthopnea or PND.  He underwent urological procedure a few days ago and still has some hematuria from it and followed by his urologist.  At the time of my evaluation, the patient is alert awake oriented and in no distress.  The patient does not have symptoms concerning for COVID-19 infection (fever, chills, cough, or new shortness of breath).    Past Medical History:  Diagnosis Date  . Arthritis   . Congenital absence of one kidney    01-04-2018 per pt unsure which one  . COPD (chronic obstructive pulmonary disease) ([place])    pulmologist-  dr Alcide Clever [name])  . GERD (gastroesophageal reflux disease)   . On supplemental oxygen therapy    01-04-2018  per pt prescribed as needed ,  states checks oxygen level daily,  and  average between 88-90% on room air,  per pt has not needed O2 past 4 months  . OSA on CPAP   . Pancreatic cancer ([place])    01-04-2018  per pt dx 10/ 2009 found by CT imaging, no surgery,  treatment chemotherapy  @ [facility],  in remission since 2017   Past Surgical History:  Procedure Laterality Date  . COLONOSCOPY  last one 2019  . KNEE ARTHROSCOPY Right 2002  . KNEE ARTHROSCOPY WITH MEDIAL MENISECTOMY Left 01/06/2018   Procedure: LEFT KNEE ARTHROSCOPY WITH MEDIAL MENISECTOMY;  Surgeon: Rod Can, MD;  Location: [facility];  Service: Orthopedics;  Laterality: Left;  . LEFT HEART CATH AND CORONARY ANGIOGRAPHY N/A 11/21/2018   Procedure: LEFT HEART CATH AND CORONARY ANGIOGRAPHY;  Surgeon: Nelva Bush, MD;  Location: [facility];  Service: Cardiovascular;  Laterality: N/A;     Current Meds  Medication Sig  . albuterol (ACCUNEB) 0.63 MG/3ML nebulizer solution Take 1 ampule by nebulization every 6 (six) hours as needed for wheezing.   . Albuterol Sulfate (PROAIR HFA IN) Inhale into the lungs.  . ALPRAZolam (XANAX) 0.25 MG tablet Take 0.25 mg by mouth 3 (three) times daily as needed.   . fluticasone (FLONASE) 50 MCG/ACT nasal spray Place 1 spray into both nostrils 2 (two) times daily.  . Fluticasone Propionate (XHANCE) 93 MCG/ACT EXHU Inhale 1 spray into the lungs at bedtime.  Marland Kitchen ibuprofen (ADVIL,MOTRIN) 200 MG tablet Take 200 mg by mouth every  6 (six) hours as needed.  . metolazone (ZAROXOLYN) 2.5 MG tablet Take 1 tablet (2.5 mg total) by mouth daily.  . montelukast (SINGULAIR) 10 MG tablet Take 10 mg by mouth at bedtime.  . nitroGLYCERIN (NITROSTAT) 0.4 MG SL tablet Place 1 tablet (0.4 mg total) under the tongue every 5 (five) minutes as needed.  Marland Kitchen NYAMYC powder APPLY TO AFFECTED AREAS TWICE DAILY  . Omeprazole 20 MG TBEC Take 20 mg by mouth 2 (two) times daily.   . potassium chloride SA (K-DUR) 20 MEQ tablet TAKE 2 TABLETS BY MOUTH EVERY DAY  . SYMBICORT 160-4.5  MCG/ACT inhaler Inhale 2 puffs into the lungs 2 (two) times daily.  . tamsulosin (FLOMAX) 0.4 MG CAPS capsule Take 0.4 mg by mouth daily.     Allergies:   Patient has no known allergies.   Social History   Tobacco Use  . Smoking status: Former Smoker    Years: 31.00    Types: Cigarettes  . Smokeless tobacco: Never Used  Substance Use Topics  . Alcohol use: Not Currently    Frequency: Never  . Drug use: Never     Family Hx: The patient's Family history is unknown by patient.  ROS:   Please see the history of present illness.    As mentioned above All other systems reviewed and are negative.   Prior CV studies:   The following studies were reviewed today:  LEFT HEART CATH AND CORONARY ANGIOGRAPHY  Conclusion  Conclusions: 1. No angiographically significant coronary artery disease. 2. Normal left ventricular systolic function. 3. Mildly to moderately elevated LVEDP (significang respiratory variation noted).  Recommendations: 1. Primary prevention of coronary artery disease. 2. Proceed with outpatient echocardiogram, as previously scheduled by Dr. Geraldo Pitter.  Nelva Bush, MD [facility] Pager: [phone]     Labs/Other Tests and Data Reviewed:    EKG:  No ECG reviewed.  Recent Labs: 11/08/2018: NT-Pro BNP 40 01/09/2019: BUN 13; Creatinine, Ser 1.15; Hemoglobin 16.9; Platelets 314; Potassium 3.5; Sodium 137   Recent Lipid Panel No results found for: CHOL, TRIG, HDL, CHOLHDL, LDLCALC, LDLDIRECT  Wt Readings from Last 3 Encounters:  04/21/19 (!) 316 lb (143.3 kg)  12/19/18 (!) 318 lb (144.2 kg)  12/01/18 (!) 308 lb (139.7 kg)     Objective:    Vital Signs:  BP 124/88   Pulse 91   Wt (!) 316 lb (143.3 kg)   SpO2 (!) 87%   BMI 41.69 kg/m    VITAL SIGNS:  reviewed  ASSESSMENT & PLAN:    1. Diastolic congestive heart failure: Diet was discussed.  Importance of compliance stressed.  Weight reduction stressed.  Patient will continue his  diuretic therapy and will come back for a Chem-7 in the next few days. Essential hypertension: Blood pressure stable Morbid obesity: Diet was discussed with the patient and he promises to do better and lose weight. Patient will be seen in follow-up appointment in 6 months or earlier if the patient has any concerns   COVID-19 Education: The signs and symptoms of COVID-19 were discussed with the patient and how to seek care for testing (follow up with PCP or arrange E-visit).  The importance of social distancing was discussed today.  Time:   Today, I have spent 15 minutes with the patient with telehealth technology discussing the above problems.     Medication Adjustments/Labs and Tests Ordered: Current medicines are reviewed at length with the patient today.  Concerns regarding medicines are outlined above.  Tests Ordered: No orders of the defined types were placed in this encounter.   Medication Changes: No orders of the defined types were placed in this encounter.   Follow Up:  Either In Person or Virtual in 6 month(s)  Signed, Jenean Lindau, MD  04/21/2019 8:57 AM    [place]

## 2019-04-21 NOTE — Addendum Note (Signed)
Addended by: Beckey Rutter on: 04/21/2019 09:43 AM   Modules accepted: Orders

## 2019-04-24 LAB — BASIC METABOLIC PANEL
BUN/Creatinine Ratio: 11 (ref 10–24)
BUN: 13 mg/dL (ref 8–27)
CO2: 28 mmol/L (ref 20–29)
Calcium: 9.8 mg/dL (ref 8.6–10.2)
Chloride: 92 mmol/L — ABNORMAL LOW (ref 96–106)
Creatinine, Ser: 1.22 mg/dL (ref 0.76–1.27)
GFR calc Af Amer: 74 mL/min/{1.73_m2} (ref >59)
GFR calc non Af Amer: 64 mL/min/{1.73_m2} (ref >59)
Glucose: 112 mg/dL — ABNORMAL HIGH (ref 65–99)
Potassium: 3.6 mmol/L (ref 3.5–5.2)
Sodium: 138 mmol/L (ref 134–144)

## 2019-04-26 ENCOUNTER — Telehealth: Payer: Self-pay

## 2019-04-26 NOTE — Telephone Encounter (Signed)
-----   Message from Jenean Lindau, MD sent at 04/25/2019 11:39 AM EST ----- The results of the study is unremarkable. Please inform patient. I will discuss in detail at next appointment. Cc  primary care/referring physician Jenean Lindau, MD 04/25/2019 11:39 AM

## 2019-04-26 NOTE — Telephone Encounter (Signed)
Results relayed, copy sent to Dr. Venetia Maxon

## 2019-07-06 ENCOUNTER — Other Ambulatory Visit: Payer: Self-pay | Admitting: Cardiology

## 2019-09-30 ENCOUNTER — Other Ambulatory Visit: Payer: Self-pay | Admitting: Cardiology

## 2019-11-22 LAB — PULMONARY FUNCTION TEST
DLCO: 10.01 ml/mmHg sec
FEV1/FVC: 49 %
FEV1: 1.44 L
FVC: 1.81 L
TLC: 5.55

## 2019-11-27 ENCOUNTER — Other Ambulatory Visit: Payer: Self-pay

## 2019-11-28 ENCOUNTER — Telehealth: Payer: Self-pay | Admitting: Cardiology

## 2019-11-28 NOTE — Telephone Encounter (Signed)
Patient wanted to make sure someone from the [place] office will be able to meet him at his Car on Thursday with a wheelchair. He is not able to walk into the office on his own

## 2019-11-28 NOTE — Telephone Encounter (Signed)
Pt will call [phone] for assistance with a wheelchair. when arriving

## 2019-11-30 ENCOUNTER — Ambulatory Visit (INDEPENDENT_AMBULATORY_CARE_PROVIDER_SITE_OTHER): Payer: Medicaid Other | Admitting: Cardiology

## 2019-11-30 ENCOUNTER — Other Ambulatory Visit: Payer: Self-pay

## 2019-11-30 ENCOUNTER — Encounter: Payer: Self-pay | Admitting: Cardiology

## 2019-11-30 DIAGNOSIS — E7800 Pure hypercholesterolemia, unspecified: Secondary | ICD-10-CM

## 2019-11-30 DIAGNOSIS — Z5181 Encounter for therapeutic drug level monitoring: Secondary | ICD-10-CM

## 2019-11-30 DIAGNOSIS — E041 Nontoxic single thyroid nodule: Secondary | ICD-10-CM

## 2019-11-30 DIAGNOSIS — J431 Panlobular emphysema: Secondary | ICD-10-CM

## 2019-11-30 DIAGNOSIS — T466X5D Adverse effect of antihyperlipidemic and antiarteriosclerotic drugs, subsequent encounter: Secondary | ICD-10-CM

## 2019-11-30 DIAGNOSIS — I2510 Atherosclerotic heart disease of native coronary artery without angina pectoris: Secondary | ICD-10-CM

## 2019-11-30 DIAGNOSIS — Z79899 Other long term (current) drug therapy: Secondary | ICD-10-CM

## 2019-11-30 HISTORY — DX: Atherosclerotic heart disease of native coronary artery without angina pectoris: I25.10

## 2019-11-30 LAB — LIPID PANEL
Chol/HDL Ratio: 4.7 ratio (ref 0.0–5.0)
Cholesterol, Total: 203 mg/dL — ABNORMAL HIGH (ref 100–199)
HDL: 43 mg/dL (ref >39)
LDL Chol Calc (NIH): 137 mg/dL — ABNORMAL HIGH (ref 0–99)
Triglycerides: 129 mg/dL (ref 0–149)
VLDL Cholesterol Cal: 23 mg/dL (ref 5–40)

## 2019-11-30 LAB — BASIC METABOLIC PANEL
BUN/Creatinine Ratio: 14 (ref 10–24)
BUN: 17 mg/dL (ref 8–27)
CO2: 28 mmol/L (ref 20–29)
Calcium: 9.1 mg/dL (ref 8.6–10.2)
Chloride: 99 mmol/L (ref 96–106)
Creatinine, Ser: 1.22 mg/dL (ref 0.76–1.27)
GFR calc Af Amer: 74 mL/min/{1.73_m2} (ref >59)
GFR calc non Af Amer: 64 mL/min/{1.73_m2} (ref >59)
Glucose: 101 mg/dL — ABNORMAL HIGH (ref 65–99)
Potassium: 3.6 mmol/L (ref 3.5–5.2)
Sodium: 137 mmol/L (ref 134–144)

## 2019-11-30 LAB — HEPATIC FUNCTION PANEL
ALT: 18 IU/L (ref 0–44)
AST: 14 IU/L (ref 0–40)
Albumin: 3.9 g/dL (ref 3.8–4.8)
Alkaline Phosphatase: 85 IU/L (ref 48–121)
Bilirubin Total: 0.4 mg/dL (ref 0.0–1.2)
Bilirubin, Direct: 0.14 mg/dL (ref 0.00–0.40)
Total Protein: 6.6 g/dL (ref 6.0–8.5)

## 2019-11-30 LAB — TSH: TSH: 2.36 u[IU]/mL (ref 0.450–4.500)

## 2019-11-30 MED ORDER — ASPIRIN EC 81 MG PO TBEC
81.0000 mg | DELAYED_RELEASE_TABLET | Freq: Every day | ORAL | 3 refills | Status: DC
Start: 2019-11-30 — End: 2022-09-24

## 2019-11-30 NOTE — Progress Notes (Signed)
Cardiology Office Note:    Date:  11/30/2019   ID:  Taylor Vargas, DOB July 29, 1960, MRN [id number]  PCP:  Street, Sharon Mt, MD  Cardiologist:  Jenean Lindau, MD   Referring MD: Emmaline Kluver, *    ASSESSMENT:    1. Morbid obesity ([place])   2. Panlobular emphysema ([place])   3. Hypercholesterolemia   4. Coronary artery calcification seen on CAT scan    PLAN:    In order of problems listed above:  1. Coronary calcifications: Secondary prevention stressed to the patient.  Importance of compliance with diet medication stressed and he vocalized understanding.   2. Mixed dyslipidemia: We will check his lipids today.  In view of findings of coronary calcifications he will need to be on statin therapy.  I told him to take a coated baby aspirin on a daily basis. 3. Advanced COPD on home oxygen: I discussed these findings with the patient at length and he is followed by pulmonologist. 4. Morbid obesity: Weight reduction was stressed diet was emphasized.Patient will be seen in follow-up appointment in 6 months or earlier if the patient has any concerns    Medication Adjustments/Labs and Tests Ordered: Current medicines are reviewed at length with the patient today.  Concerns regarding medicines are outlined above.  No orders of the defined types were placed in this encounter.  No orders of the defined types were placed in this encounter.    Chief Complaint  Patient presents with  . Follow-up     History of Present Illness:    Taylor Vargas is a 59 y.o. male.  Patient has history of advanced COPD, morbid obesity and recent CT scan revealed calcifications of the coronary arteries.  He has home oxygen all the time.  She denies any chest pain orthopnea or PND.  At the time of my evaluation, the patient is alert awake oriented and in no distress.  He is in a wheelchair.  He is morbidly obese.  He does have baseline shortness of breath on exertion  Past Medical History:  Diagnosis  Date  . Arthritis   . Congenital absence of one kidney    01-04-2018 per pt unsure which one  . COPD (chronic obstructive pulmonary disease) ([place])    pulmologist-  dr Alcide Clever [name])  . GERD (gastroesophageal reflux disease)   . On supplemental oxygen therapy    01-04-2018  per pt prescribed as needed ,  states checks oxygen level daily,  and average between 88-90% on room air,  per pt has not needed O2 past 4 months  . OSA on CPAP   . Pancreatic cancer ([place])    01-04-2018  per pt dx 10/ 2009 found by CT imaging, no surgery,  treatment chemotherapy  @ [facility],  in remission since 2017    Past Surgical History:  Procedure Laterality Date  . COLONOSCOPY  last one 2019  . KNEE ARTHROSCOPY Right 2002  . KNEE ARTHROSCOPY WITH MEDIAL MENISECTOMY Left 01/06/2018   Procedure: LEFT KNEE ARTHROSCOPY WITH MEDIAL MENISECTOMY;  Surgeon: Rod Can, MD;  Location: [facility];  Service: Orthopedics;  Laterality: Left;  . LEFT HEART CATH AND CORONARY ANGIOGRAPHY N/A 11/21/2018   Procedure: LEFT HEART CATH AND CORONARY ANGIOGRAPHY;  Surgeon: Nelva Bush, MD;  Location: [facility];  Service: Cardiovascular;  Laterality: N/A;    Current Medications: Current Meds  Medication Sig  . albuterol (ACCUNEB) 0.63 MG/3ML nebulizer solution Take 1 ampule by nebulization every 6 (six) hours  as needed for wheezing.   . Albuterol Sulfate (PROAIR HFA IN) Inhale into the lungs.  Marland Kitchen allopurinol (ZYLOPRIM) 100 MG tablet allopurinol 100 mg tablet  TAKE 1 TABLET BY MOUTH DAILY  . ALPRAZolam (XANAX) 0.25 MG tablet Take 0.25 mg by mouth 3 (three) times daily as needed.   . budesonide-formoterol (SYMBICORT) 160-4.5 MCG/ACT inhaler Symbicort 160 mcg-4.5 mcg/actuation HFA aerosol inhaler  INHALE 2 PUFFS BY MOUTH TWICE DAILY  . diclofenac Sodium (VOLTAREN) 1 % GEL Voltaren 1 % topical gel  APPLY 2 GRAM TO THE AFFECTED AREA(S) BY TOPICAL ROUTE 2 or 3 TIMES PER DAY  . fluticasone (FLONASE) 50  MCG/ACT nasal spray Place 1 spray into both nostrils 2 (two) times daily.  . Fluticasone Propionate (XHANCE) 93 MCG/ACT EXHU Inhale 1 spray into the lungs at bedtime.  Marland Kitchen HYDROcodone-acetaminophen (NORCO/VICODIN) 5-325 MG tablet Take 1 tablet by mouth as needed.  Marland Kitchen ibuprofen (ADVIL,MOTRIN) 200 MG tablet Take 200 mg by mouth every 6 (six) hours as needed.  Marland Kitchen ipratropium-albuterol (DUONEB) 0.5-2.5 (3) MG/3ML SOLN ipratropium 0.5 mg-albuterol 3 mg (2.5 mg base)/3 mL nebulization soln  INHALE CONTENTS OF 1 VIAL BY NEBULIZER FOUR TIMES DAILY AS NEEDED  . levofloxacin (LEVAQUIN) 250 MG tablet Take 1 tablet by mouth daily.  . metolazone (ZAROXOLYN) 2.5 MG tablet TAKE 1 TABLET BY MOUTH EVERY DAY  . montelukast (SINGULAIR) 10 MG tablet Take 10 mg by mouth at bedtime.  . naloxone (NARCAN) 4 MG/0.1ML LIQD nasal spray kit Narcan 4 mg/actuation nasal spray  USE AS DIRECTED IF POORLY RESPONSIVE OR TURNING BLUE  . nitroGLYCERIN (NITROSTAT) 0.4 MG SL tablet Place 1 tablet (0.4 mg total) under the tongue every 5 (five) minutes as needed.  Marland Kitchen NYAMYC powder APPLY TO AFFECTED AREAS TWICE DAILY  . Omeprazole 20 MG TBEC Take 20 mg by mouth 2 (two) times daily.   . potassium chloride SA (KLOR-CON) 20 MEQ tablet TAKE 2 TABLETS BY MOUTH EVERY DAY  . tamsulosin (FLOMAX) 0.4 MG CAPS capsule Take 0.4 mg by mouth daily.  Marland Kitchen tiotropium (SPIRIVA HANDIHALER) 18 MCG inhalation capsule Spiriva with HandiHaler 18 mcg and inhalation capsules  INHALE THE CONTENTS OF 1 CAPSULE ONCE A DA Y     Allergies:   Patient has no known allergies.   Social History   Socioeconomic History  . Marital status: Single    Spouse name: Not on file  . Number of children: Not on file  . Years of education: Not on file  . Highest education level: Not on file  Occupational History  . Not on file  Tobacco Use  . Smoking status: Former Smoker    Years: 31.00    Types: Cigarettes  . Smokeless tobacco: Never Used  Vaping Use  . Vaping Use:  Never used  Substance and Sexual Activity  . Alcohol use: Not Currently  . Drug use: Never  . Sexual activity: Not on file  Other Topics Concern  . Not on file  Social History Narrative  . Not on file   Social Determinants of Health   Financial Resource Strain:   . Difficulty of Paying Living Expenses:   Food Insecurity:   . Worried About Charity fundraiser in the Last Year:   . Arboriculturist in the Last Year:   Transportation Needs:   . Film/video editor (Medical):   Marland Kitchen Lack of Transportation (Non-Medical):   Physical Activity:   . Days of Exercise per Week:   . Minutes of  Exercise per Session:   Stress:   . Feeling of Stress :   Social Connections:   . Frequency of Communication with Friends and Family:   . Frequency of Social Gatherings with Friends and Family:   . Attends Religious Services:   . Active Member of Clubs or Organizations:   . Attends Archivist Meetings:   Marland Kitchen Marital Status:      Family History: The patient's Family history is unknown by patient.  ROS:   Please see the history of present illness.    All other systems reviewed and are negative.  EKGs/Labs/Other Studies Reviewed:    The following studies were reviewed today: EKG reveals sinus rhythm and nonspecific ST-T changes. I reviewed her CT scan from [facility] and today it revealed significant coronary calcification.   Recent Labs: 01/09/2019: Hemoglobin 16.9; Platelets 314 04/24/2019: BUN 13; Creatinine, Ser 1.22; Potassium 3.6; Sodium 138  Recent Lipid Panel No results found for: CHOL, TRIG, HDL, CHOLHDL, VLDL, LDLCALC, LDLDIRECT  Physical Exam:    VS:  BP 90/60   Pulse 79   Ht 6' 1"  (1.854 m)   SpO2 98%   BMI 41.69 kg/m     Wt Readings from Last 3 Encounters:  04/21/19 (!) 316 lb (143.3 kg)  12/19/18 (!) 318 lb (144.2 kg)  12/01/18 (!) 308 lb (139.7 kg)     GEN: Patient is in no acute distress HEENT: Normal NECK: No JVD; No carotid  bruits LYMPHATICS: No lymphadenopathy CARDIAC: Hear sounds regular, 2/6 systolic murmur at the apex. RESPIRATORY:  Clear to auscultation without rales, wheezing or rhonchi  ABDOMEN: Soft, non-tender, non-distended MUSCULOSKELETAL:  No edema; No deformity  SKIN: Warm and dry NEUROLOGIC:  Alert and oriented x 3 PSYCHIATRIC:  Normal affect   Signed, Jenean Lindau, MD  11/30/2019 10:37 AM    [place]

## 2019-11-30 NOTE — Patient Instructions (Signed)
Medication Instructions:  Your physician has recommended you make the following change in your medication: Start taking 81 mg aspirin daily.  *If you need a refill on your cardiac medications before your next appointment, please call your pharmacy*   Lab Work: Your physician recommends that you have labs done in the office today. Your test included  basic metabolic panel, TSH, liver function and lipids.  If you have labs (blood work) drawn today and your tests are completely normal, you will receive your results only by: Marland Kitchen MyChart Message (if you have MyChart) OR . A paper copy in the mail If you have any lab test that is abnormal or we need to change your treatment, we will call you to review the results.   Testing/Procedures: None ordered   Follow-Up: At [facility], you and your health needs are our priority.  As part of our continuing mission to provide you with exceptional heart care, we have created designated Provider Care Teams.  These Care Teams include your primary Cardiologist (physician) and Advanced Practice Providers (APPs -  Physician Assistants and Nurse Practitioners) who all work together to provide you with the care you need, when you need it.  We recommend signing up for the patient portal called "MyChart".  Sign up information is provided on this After Visit Summary.  MyChart is used to connect with patients for Virtual Visits (Telemedicine).  Patients are able to view lab/test results, encounter notes, upcoming appointments, etc.  Non-urgent messages can be sent to your provider as well.   To learn more about what you can do with MyChart, go to [email or web address].    Your next appointment:   4 month(s)  The format for your next appointment:   In Person  Provider:   Jyl Heinz, MD   Other Instructions NA

## 2019-12-01 ENCOUNTER — Telehealth: Payer: Self-pay | Admitting: Cardiology

## 2019-12-01 MED ORDER — ATORVASTATIN CALCIUM 10 MG PO TABS: 10.0000 mg | ORAL_TABLET | Freq: Every day | ORAL | 3 refills | Status: DC

## 2019-12-01 NOTE — Telephone Encounter (Signed)
Larene Beach from [facility] is calling requesting the patient's last Echo results. The fax number is [phone].

## 2019-12-01 NOTE — Telephone Encounter (Signed)
Echo was sent

## 2019-12-01 NOTE — Addendum Note (Signed)
Addended by: Truddie Hidden on: 12/01/2019 11:16 AM   Modules accepted: Orders

## 2019-12-21 ENCOUNTER — Telehealth: Payer: Self-pay | Admitting: Cardiology

## 2019-12-21 NOTE — Telephone Encounter (Signed)
FYI Spoke with pt who states that he is not going to an urgent care or ED because of COVID. Pt states "I just need a damn ultasound." I explained to pt due to the increased swelling, shortness of breath and pain he may need additional treatment, Pt states that he is not going and he will call another doctor.

## 2019-12-21 NOTE — Telephone Encounter (Signed)
Needs to go to ED or urgicare to get eval asap

## 2019-12-21 NOTE — Telephone Encounter (Signed)
New Message  Pt c/o swelling: STAT is pt has developed SOB within 24 hours  1) How much weight have you gained and in what time span? Pt does not know   2) If swelling, where is the swelling located? Both legs and nu,bess in both bottoms of feet   3) Are you currently taking a fluid pill? Yes   4) Are you currently SOB? Yes, has COPD and cancer   5) Do you have a log of your daily weights (if so, list)? No   6) Have you gained 3 pounds in a day or 5 pounds in a week? Unsure   7) Have you traveled recently? No

## 2019-12-21 NOTE — Telephone Encounter (Signed)
Pt states that he has increased swelling in his legs with the right being worse than the left. Pt states that he has 2 cancer masses that Dr. Nila Nephew will be doing a procedure on in the next couple of weeks. Pt states that he is concerned he may have a DVT due to the change in his legs. How do you advise?

## 2019-12-25 ENCOUNTER — Other Ambulatory Visit: Payer: Self-pay | Admitting: Cardiology

## 2019-12-28 ENCOUNTER — Telehealth: Payer: Self-pay | Admitting: Pulmonary Disease

## 2019-12-28 NOTE — Telephone Encounter (Signed)
Spoke with the pt  He is asking that we call and obtain his CT Chest from [place]  I called and spoke with Manuela Schwartz at [facility] [place] and stated to fax over a request of what we needed to her at [phone]  I have done so and nothing further needed

## 2020-01-04 DIAGNOSIS — M25561 Pain in right knee: Secondary | ICD-10-CM | POA: Insufficient documentation

## 2020-01-04 DIAGNOSIS — G8929 Other chronic pain: Secondary | ICD-10-CM

## 2020-01-04 HISTORY — DX: Other chronic pain: G89.29

## 2020-01-11 DIAGNOSIS — M1711 Unilateral primary osteoarthritis, right knee: Secondary | ICD-10-CM | POA: Insufficient documentation

## 2020-01-11 HISTORY — DX: Unilateral primary osteoarthritis, right knee: M17.11

## 2020-01-12 LAB — HEPATIC FUNCTION PANEL
ALT: 14 IU/L (ref 0–44)
AST: 15 IU/L (ref 0–40)
Albumin: 4.3 g/dL (ref 3.8–4.8)
Alkaline Phosphatase: 92 IU/L (ref 48–121)
Bilirubin Total: 0.6 mg/dL (ref 0.0–1.2)
Bilirubin, Direct: 0.16 mg/dL (ref 0.00–0.40)
Total Protein: 7 g/dL (ref 6.0–8.5)

## 2020-01-12 LAB — LIPID PANEL
Chol/HDL Ratio: 3.8 ratio (ref 0.0–5.0)
Cholesterol, Total: 166 mg/dL (ref 100–199)
HDL: 44 mg/dL (ref >39)
LDL Chol Calc (NIH): 106 mg/dL — ABNORMAL HIGH (ref 0–99)
Triglycerides: 88 mg/dL (ref 0–149)
VLDL Cholesterol Cal: 16 mg/dL (ref 5–40)

## 2020-01-16 MED ORDER — ATORVASTATIN CALCIUM 20 MG PO TABS: 20.0000 mg | ORAL_TABLET | Freq: Every day | ORAL | 3 refills | Status: DC

## 2020-01-16 NOTE — Addendum Note (Signed)
Addended by: Truddie Hidden on: 01/16/2020 10:07 AM   Modules accepted: Orders

## 2020-02-27 LAB — LIPID PANEL
Chol/HDL Ratio: 3.4 ratio (ref 0.0–5.0)
Cholesterol, Total: 161 mg/dL (ref 100–199)
HDL: 48 mg/dL (ref >39)
LDL Chol Calc (NIH): 96 mg/dL (ref 0–99)
Triglycerides: 91 mg/dL (ref 0–149)
VLDL Cholesterol Cal: 17 mg/dL (ref 5–40)

## 2020-02-27 LAB — HEPATIC FUNCTION PANEL
ALT: 17 IU/L (ref 0–44)
AST: 15 IU/L (ref 0–40)
Albumin: 4.2 g/dL (ref 3.8–4.8)
Alkaline Phosphatase: 99 IU/L (ref 44–121)
Bilirubin Total: 0.5 mg/dL (ref 0.0–1.2)
Bilirubin, Direct: 0.15 mg/dL (ref 0.00–0.40)
Total Protein: 7 g/dL (ref 6.0–8.5)

## 2020-03-04 ENCOUNTER — Telehealth: Payer: Self-pay | Admitting: Pulmonary Disease

## 2020-03-04 ENCOUNTER — Encounter: Payer: Self-pay | Admitting: Pulmonary Disease

## 2020-03-04 ENCOUNTER — Ambulatory Visit: Payer: Medicaid Other | Admitting: Pulmonary Disease

## 2020-03-04 ENCOUNTER — Other Ambulatory Visit: Payer: Self-pay

## 2020-03-04 VITALS — BP 122/74 | HR 96 | Temp 98.3°F | Ht 72.0 in | Wt 322.2 lb

## 2020-03-04 DIAGNOSIS — R590 Localized enlarged lymph nodes: Secondary | ICD-10-CM | POA: Diagnosis not present

## 2020-03-04 DIAGNOSIS — G4733 Obstructive sleep apnea (adult) (pediatric): Secondary | ICD-10-CM | POA: Diagnosis not present

## 2020-03-04 DIAGNOSIS — J9611 Chronic respiratory failure with hypoxia: Secondary | ICD-10-CM | POA: Diagnosis not present

## 2020-03-04 DIAGNOSIS — J449 Chronic obstructive pulmonary disease, unspecified: Secondary | ICD-10-CM | POA: Diagnosis not present

## 2020-03-04 MED ORDER — BUDESONIDE-FORMOTEROL FUMARATE 160-4.5 MCG/ACT IN AERO: INHALATION_SPRAY | RESPIRATORY_TRACT | 1 refills | Status: DC

## 2020-03-04 MED ORDER — MONTELUKAST SODIUM 10 MG PO TABS: 10.0000 mg | ORAL_TABLET | Freq: Every day | ORAL | 1 refills | Status: DC

## 2020-03-04 MED ORDER — SPIRIVA HANDIHALER 18 MCG IN CAPS: ORAL_CAPSULE | RESPIRATORY_TRACT | 1 refills | Status: DC

## 2020-03-04 NOTE — Patient Instructions (Signed)
Will arrange for CT chest with IV contrast  Will try to arrange for new CPAP machine  Will arrange for overnight oxygen test with you on CPAP  Follow up in 4 weeks

## 2020-03-04 NOTE — Progress Notes (Signed)
[facility]  Chief Complaint  Patient presents with  . Consult    shortness of breath with exertion    Constitutional:  BP 122/74 (BP Location: Left Arm, Cuff Size: Normal)   Pulse 96   Temp 98.3 F (36.8 C) (Oral)   Ht 6' (1.829 m)   Wt (!) 322 lb 3.2 oz (146.1 kg)   SpO2 94% Comment: 2L pulse O2  BMI 43.70 kg/m   Past Medical History:  Solitary kidney, Chronic pain, GERD, Genital warts, Plantar fasciitis, Thyroid nodule, Pancreatic cancer, Colon cancer, Bladder cancer  Past Surgical History:  His  has a past surgical history that includes Knee arthroscopy (Right, 2002); Colonoscopy (last one 2019); Knee arthroscopy with medial menisectomy (Left, 01/06/2018); and LEFT HEART CATH AND CORONARY ANGIOGRAPHY (N/A, 11/21/2018).  Brief Summary:  Taylor Vargas is a 59 y.o. male former smoker with COPD, obstructive sleep apnea, and chronic hypoxic respiratory failure.       Subjective:  He was previously seen by Dr. Alcide Clever in [place].  He has been told he has COPD and obstructive sleep apnea.  He requires use of supplemental oxygen.  He has progressive dyspnea with activity.   He gets intermittent cough and wheeze.  Has trouble doing any type of activity.  Not having leg swelling or chest pain.  Denies recent fever, or sweats.  Denies hemoptysis.  He has been using symbicort, singulair, spiriva, and nebulizer.  He had CT chest in May 2021 that showed mediastinal adenopathy.  He has been using CPAP at night with 3 liters oxygen.  He hasn't been able to use his CPPA recently because his machine broke.  He goes to bed at 5 pm.  He falls asleep in 30 minutes.  Wakes up several times to use the bathroom.  Gets up at 430 am.  Feels tired in the day.  Epworth score in 9 out of 24.   Physical Exam:   Appearance - well kempt, wearing oxygen  ENMT - no sinus tenderness, no oral exudate, no LAN, Mallampati 3 airway, no stridor  Respiratory -  equal breath sounds bilaterally, no wheezing or rales  CV - s1s2 regular rate and rhythm, no murmurs  Ext - no clubbing, no edema  Skin - no rashes  Psych - normal mood and affect   Pulmonary testing:   PFT 12/22/19 >> FEV1 0.57 (19%), FEV1% 88, TLC 5.55 (76%), DLCO 32%  Chest Imaging:   CT chest 11/01/19 >> moderate centrilobular and paraseptal emphysema, subcarinal LN 1.2 cm, Rt juxta esophageal LN 1.1 cm  Sleep Tests:   PSG 04/06/19 >> AHI 8.9, SpO2 low 71%, CPAP 16 cm H2O  Cardiac Tests:   LHC 11/21/18 >> non obstructive CAD  Echo 11/25/18 >> EF 60 to 65%  Social History:  He  reports that he quit smoking about 3 years ago. His smoking use included cigarettes. He has a 62.00 pack-year smoking history. He has never used smokeless tobacco. He reports previous alcohol use. He reports that he does not use drugs.  Family History:  His family history includes Diabetes in his mother.     Assessment/Plan:   COPD with emphysema. - continue spiriva, singulair, symbicort - prn albuterol - will need to determine if he had Alpha 1 Antitrypsin testing before  Mediastinal lymphadenopathy noted on CT chest from May 2021. - will arrange for follow up CT chest with IV contrast  Obstructive sleep apnea. - his current machine is not  functioning properly and not amenable to repair - uses [facility] for his DME - will try to arrange for new CPAP machine  Chronic respiratory failure with hypoxia. - from COPD and sleep disordered breathing - uses 2 to 4 liters oxygen during the day, and 3 liters oxygen at night with CPAP  Time Spent Involved in Patient Care on Day of Examination:  47 minutes  Follow up:  Patient Instructions  Will arrange for CT chest with IV contrast  Will try to arrange for new CPAP machine  Will arrange for overnight oxygen test with you on CPAP  Follow up in 4 weeks   Medication List:   Allergies as of 03/04/2020   No Known Allergies      Medication List       Accurate as of March 04, 2020 11:16 AM. If you have any questions, ask your nurse or doctor.        allopurinol 100 MG tablet Commonly known as: ZYLOPRIM allopurinol 100 mg tablet  TAKE 1 TABLET BY MOUTH DAILY   ALPRAZolam 0.25 MG tablet Commonly known as: XANAX Take 0.25 mg by mouth 3 (three) times daily as needed.   aspirin EC 81 MG tablet Take 1 tablet (81 mg total) by mouth daily. Swallow whole.   atorvastatin 20 MG tablet Commonly known as: LIPITOR Take 1 tablet (20 mg total) by mouth daily.   Xhance 93 MCG/ACT Exhu Generic drug: Fluticasone Propionate Inhale 1 spray into the lungs at bedtime.   fluticasone 50 MCG/ACT nasal spray Commonly known as: FLONASE Place 1 spray into both nostrils 2 (two) times daily.   HYDROcodone-acetaminophen 5-325 MG tablet Commonly known as: NORCO/VICODIN Take 1 tablet by mouth as needed.   ibuprofen 200 MG tablet Commonly known as: ADVIL Take 200 mg by mouth every 6 (six) hours as needed.   ipratropium-albuterol 0.5-2.5 (3) MG/3ML Soln Commonly known as: DUONEB ipratropium 0.5 mg-albuterol 3 mg (2.5 mg base)/3 mL nebulization soln  INHALE CONTENTS OF 1 VIAL BY NEBULIZER FOUR TIMES DAILY AS NEEDED   levofloxacin 250 MG tablet Commonly known as: LEVAQUIN Take 1 tablet by mouth daily.   metolazone 2.5 MG tablet Commonly known as: ZAROXOLYN TAKE 1 TABLET BY MOUTH EVERY DAY   montelukast 10 MG tablet Commonly known as: SINGULAIR Take 10 mg by mouth at bedtime.   Narcan 4 MG/0.1ML Liqd nasal spray kit Generic drug: naloxone Narcan 4 mg/actuation nasal spray  USE AS DIRECTED IF POORLY RESPONSIVE OR TURNING BLUE   nitroGLYCERIN 0.4 MG SL tablet Commonly known as: NITROSTAT Place 1 tablet (0.4 mg total) under the tongue every 5 (five) minutes as needed.   Nyamyc powder Generic drug: nystatin APPLY TO AFFECTED AREAS TWICE DAILY   Omeprazole 20 MG Tbec Take 20 mg by mouth 2 (two) times daily.     potassium chloride SA 20 MEQ tablet Commonly known as: KLOR-CON TAKE 2 TABLETS BY MOUTH EVERY DAY   PROAIR HFA IN Inhale into the lungs.   albuterol 0.63 MG/3ML nebulizer solution Commonly known as: ACCUNEB Take 1 ampule by nebulization every 6 (six) hours as needed for wheezing.   Spiriva HandiHaler 18 MCG inhalation capsule Generic drug: tiotropium Spiriva with HandiHaler 18 mcg and inhalation capsules  INHALE THE CONTENTS OF 1 CAPSULE ONCE A DA Y   Symbicort 160-4.5 MCG/ACT inhaler Generic drug: budesonide-formoterol Symbicort 160 mcg-4.5 mcg/actuation HFA aerosol inhaler  INHALE 2 PUFFS BY MOUTH TWICE DAILY   tamsulosin 0.4 MG Caps capsule Commonly known  as: FLOMAX Take 0.4 mg by mouth daily.   Voltaren 1 % Gel Generic drug: diclofenac Sodium Voltaren 1 % topical gel  APPLY 2 GRAM TO THE AFFECTED AREA(S) BY TOPICAL ROUTE 2 or 3 TIMES PER DAY       Signature:  [name], MD [place] Pager - [phone] 03/04/2020, 11:16 AM

## 2020-03-04 NOTE — Telephone Encounter (Signed)
ATC patient and left a voicemail to let patient know I sent Dr.Sood a message if we can double book patient for 4 week f/u.

## 2020-03-05 NOTE — Telephone Encounter (Signed)
Pt has OV on 04/02/20 with Dr. Halford Chessman. Nothing further needed at this time. Will sign off.

## 2020-03-14 ENCOUNTER — Ambulatory Visit
Admission: RE | Admit: 2020-03-14 | Discharge: 2020-03-14 | Disposition: A | Discharge status: Home / Self Care | Payer: Medicaid Other | Source: Ambulatory Visit | Attending: Pulmonary Disease | Admitting: Pulmonary Disease

## 2020-03-14 DIAGNOSIS — R590 Localized enlarged lymph nodes: Secondary | ICD-10-CM

## 2020-03-14 MED ORDER — IOPAMIDOL (ISOVUE-300) INJECTION 61%
75.0000 mL | Freq: Once | INTRAVENOUS | Status: AC | PRN
  Administered 2020-03-14: 75 mL via INTRAVENOUS

## 2020-04-02 ENCOUNTER — Encounter: Payer: Self-pay | Admitting: Pulmonary Disease

## 2020-04-02 ENCOUNTER — Ambulatory Visit: Payer: Medicaid Other | Admitting: Pulmonary Disease

## 2020-04-02 ENCOUNTER — Other Ambulatory Visit: Payer: Self-pay

## 2020-04-02 VITALS — BP 128/64 | HR 97 | Temp 97.0°F | Ht 72.0 in | Wt 320.0 lb

## 2020-04-02 DIAGNOSIS — Z9989 Dependence on other enabling machines and devices: Secondary | ICD-10-CM | POA: Diagnosis not present

## 2020-04-02 DIAGNOSIS — G4733 Obstructive sleep apnea (adult) (pediatric): Secondary | ICD-10-CM

## 2020-04-02 DIAGNOSIS — J449 Chronic obstructive pulmonary disease, unspecified: Secondary | ICD-10-CM

## 2020-04-02 DIAGNOSIS — J9611 Chronic respiratory failure with hypoxia: Secondary | ICD-10-CM

## 2020-04-02 DIAGNOSIS — R918 Other nonspecific abnormal finding of lung field: Secondary | ICD-10-CM | POA: Diagnosis not present

## 2020-04-02 NOTE — Progress Notes (Signed)
[facility]  Chief Complaint  Patient presents with  . Follow-up    shortness of breath with exertion    Constitutional:  BP 128/64 (BP Location: Left Arm, Cuff Size: Normal)   Pulse 97   Temp (!) 97 F (36.1 C) (Other (Comment)) Comment (Src): wrist  Ht 6' (1.829 m)   Wt (!) 320 lb (145.2 kg)   SpO2 94% Comment: 3L Pulse O2  BMI 43.40 kg/m   Past Medical History:  Solitary kidney, Chronic pain, GERD, Genital warts, Plantar fasciitis, Thyroid nodule, Pancreatic cancer, Colon cancer, Bladder cancer  Past Surgical History:  His  has a past surgical history that includes Knee arthroscopy (Right, 2002); Colonoscopy (last one 2019); Knee arthroscopy with medial menisectomy (Left, 01/06/2018); and LEFT HEART CATH AND CORONARY ANGIOGRAPHY (N/A, 11/21/2018).  Brief Summary:  Taylor Vargas is a 59 y.o. male former smoker with COPD, obstructive sleep apnea, and chronic hypoxic respiratory failure.       Subjective:   He still hasn't received his new CPAP machine.  He tried [employer], and was told he could get a new machine now if he paid out of pocket.  He can't afford this.    He gets winded easily.  Not having much cough or sputum.  Not having wheezing, chest pain, or leg swelling.  CT chest from September 2021 showed stable mediastinal adenopathy and likely benign.  Did have two new small nodules in rt upper lung.   Physical Exam:   Appearance - well kempt, wearing oxygen, uses a walker  ENMT - no sinus tenderness, no oral exudate, no LAN, Mallampati 3 airway, no stridor  Respiratory - decreased breath sounds bilaterally, no wheezing or rales  CV - s1s2 regular rate and rhythm, no murmurs  Ext - no clubbing, no edema  Skin - no rashes  Psych - normal mood and affect    Pulmonary testing:   PFT 12/22/19 >> FEV1 0.57 (19%), FEV1% 88, TLC 5.55 (76%), DLCO 32%  Chest Imaging:   CT chest 11/01/19 >> moderate  centrilobular and paraseptal emphysema, subcarinal LN 1.2 cm, Rt juxta esophageal LN 1.1 cm  CT chest 03/14/20 >> moderate centrilobular emphysema, 3 mm nodule RUL new, 3 mm nodule RUL new, stable LAN  Sleep Tests:   PSG 04/06/19 >> AHI 8.9, SpO2 low 71%, CPAP 16 cm H2O  Cardiac Tests:   LHC 11/21/18 >> non obstructive CAD  Echo 11/25/18 >> EF 60 to 65%  Social History:  He  reports that he quit smoking about 3 years ago. His smoking use included cigarettes. He has a 62.00 pack-year smoking history. He has never used smokeless tobacco. He reports previous alcohol use. He reports that he does not use drugs.  Family History:  His family history includes Diabetes in his mother.     Assessment/Plan:   COPD with emphysema. - continue spiriva, singulair, symbicort - prn albuterol  Rt upper lobe lung nodules. - will arrange for CT chest w/o contrast in April 2022  Obstructive sleep apnea. - his current machine is not functioning properly and not amenable to repair - he has not received proper service from [facility] and he reports that have not been providing replacement supplies - will try to arrange for a new DME to get new auto CPAP machine, and new CPAP supplies  Chronic respiratory failure with hypoxia. - from COPD and sleep disordered breathing - uses 2 to 4 liters oxygen during the  day and 3 liters with CPAP at night - will see if he can get a new DME to arrange for replacement nasal cannula  Time Spent Involved in Patient Care on Day of Examination:  34 minutes  Follow up:  Patient Instructions  Will try to arrange for a new home care company for new CPAP machine and oxygen supplies  Follow up in 3 months   Medication List:   Allergies as of 04/02/2020   No Known Allergies     Medication List       Accurate as of April 02, 2020 12:29 PM. If you have any questions, ask your nurse or doctor.        allopurinol 100 MG tablet Commonly known as:  ZYLOPRIM allopurinol 100 mg tablet  TAKE 1 TABLET BY MOUTH DAILY   ALPRAZolam 0.25 MG tablet Commonly known as: XANAX Take 0.25 mg by mouth 3 (three) times daily as needed.   aspirin EC 81 MG tablet Take 1 tablet (81 mg total) by mouth daily. Swallow whole.   atorvastatin 20 MG tablet Commonly known as: LIPITOR Take 1 tablet (20 mg total) by mouth daily.   budesonide-formoterol 160-4.5 MCG/ACT inhaler Commonly known as: Symbicort Symbicort 160 mcg-4.5 mcg/actuation HFA aerosol inhaler  INHALE 2 PUFFS BY MOUTH TWICE DAILY   Xhance 93 MCG/ACT Exhu Generic drug: Fluticasone Propionate Inhale 1 spray into the lungs at bedtime.   fluticasone 50 MCG/ACT nasal spray Commonly known as: FLONASE Place 1 spray into both nostrils 2 (two) times daily.   HYDROcodone-acetaminophen 5-325 MG tablet Commonly known as: NORCO/VICODIN Take 1 tablet by mouth as needed.   ibuprofen 200 MG tablet Commonly known as: ADVIL Take 200 mg by mouth every 6 (six) hours as needed.   ipratropium-albuterol 0.5-2.5 (3) MG/3ML Soln Commonly known as: DUONEB ipratropium 0.5 mg-albuterol 3 mg (2.5 mg base)/3 mL nebulization soln  INHALE CONTENTS OF 1 VIAL BY NEBULIZER FOUR TIMES DAILY AS NEEDED   levofloxacin 250 MG tablet Commonly known as: LEVAQUIN Take 1 tablet by mouth daily.   metolazone 2.5 MG tablet Commonly known as: ZAROXOLYN TAKE 1 TABLET BY MOUTH EVERY DAY   montelukast 10 MG tablet Commonly known as: SINGULAIR Take 1 tablet (10 mg total) by mouth at bedtime.   Narcan 4 MG/0.1ML Liqd nasal spray kit Generic drug: naloxone Narcan 4 mg/actuation nasal spray  USE AS DIRECTED IF POORLY RESPONSIVE OR TURNING BLUE   nitroGLYCERIN 0.4 MG SL tablet Commonly known as: NITROSTAT Place 1 tablet (0.4 mg total) under the tongue every 5 (five) minutes as needed.   Nyamyc powder Generic drug: nystatin APPLY TO AFFECTED AREAS TWICE DAILY   Omeprazole 20 MG Tbec Take 20 mg by mouth 2 (two) times  daily.   potassium chloride SA 20 MEQ tablet Commonly known as: KLOR-CON TAKE 2 TABLETS BY MOUTH EVERY DAY   PROAIR HFA IN Inhale into the lungs.   albuterol 0.63 MG/3ML nebulizer solution Commonly known as: ACCUNEB Take 1 ampule by nebulization every 6 (six) hours as needed for wheezing.   Spiriva HandiHaler 18 MCG inhalation capsule Generic drug: tiotropium Spiriva with HandiHaler 18 mcg and inhalation capsules  INHALE THE CONTENTS OF 1 CAPSULE ONCE A DA Y   tamsulosin 0.4 MG Caps capsule Commonly known as: FLOMAX Take 0.4 mg by mouth daily.   torsemide 10 MG tablet Commonly known as: DEMADEX Take 5-10 mg by mouth daily as needed.   Voltaren 1 % Gel Generic drug: diclofenac Sodium Voltaren 1 %  topical gel  APPLY 2 GRAM TO THE AFFECTED AREA(S) BY TOPICAL ROUTE 2 or 3 TIMES PER DAY       Signature:  [name], MD [place] [facility] Pager - [phone] 04/02/2020, 12:29 PM

## 2020-04-02 NOTE — Patient Instructions (Signed)
Will try to arrange for a new home care company for new CPAP machine and oxygen supplies  Follow up in 3 months

## 2020-04-03 ENCOUNTER — Telehealth: Payer: Self-pay | Admitting: Pulmonary Disease

## 2020-04-03 NOTE — Telephone Encounter (Signed)
Medication name and strength: budesonide-formoterol fumarate 160-4.50mcg Provider: Halford Chessman Pharmacy: [facility] Patient insurance ID: [id number] S Phone: [phone] Fax: [phone]  Was the PA started on CMM?  yes If yes, please enter the Key: [id number] Timeframe for approval/denial: [employer] is reviewing your PA request. Typically an electronic response will be received within 72 hours. To check for an update later, open this request from your dashboard.  Will keep encounter open to wait for status update.

## 2020-04-05 ENCOUNTER — Telehealth: Payer: Self-pay | Admitting: Pulmonary Disease

## 2020-04-05 NOTE — Telephone Encounter (Signed)
CALLED PHARMACY PA FOR SYMBICORT 160 -4.5 MCG WAS NOT NEEDED PT WAS ABLE TO PICK UP ON 03/31/2020

## 2020-04-08 ENCOUNTER — Other Ambulatory Visit: Payer: Self-pay

## 2020-04-08 DIAGNOSIS — G4733 Obstructive sleep apnea (adult) (pediatric): Secondary | ICD-10-CM | POA: Insufficient documentation

## 2020-04-08 DIAGNOSIS — Q600 Renal agenesis, unilateral: Secondary | ICD-10-CM | POA: Insufficient documentation

## 2020-04-08 DIAGNOSIS — M1990 Unspecified osteoarthritis, unspecified site: Secondary | ICD-10-CM | POA: Insufficient documentation

## 2020-04-08 DIAGNOSIS — K219 Gastro-esophageal reflux disease without esophagitis: Secondary | ICD-10-CM | POA: Insufficient documentation

## 2020-04-08 DIAGNOSIS — Z9989 Dependence on other enabling machines and devices: Secondary | ICD-10-CM | POA: Insufficient documentation

## 2020-04-08 DIAGNOSIS — J449 Chronic obstructive pulmonary disease, unspecified: Secondary | ICD-10-CM | POA: Insufficient documentation

## 2020-04-08 DIAGNOSIS — Z9981 Dependence on supplemental oxygen: Secondary | ICD-10-CM | POA: Insufficient documentation

## 2020-04-08 DIAGNOSIS — C259 Malignant neoplasm of pancreas, unspecified: Secondary | ICD-10-CM | POA: Insufficient documentation

## 2020-04-09 ENCOUNTER — Encounter: Payer: Self-pay | Admitting: Cardiology

## 2020-04-09 ENCOUNTER — Other Ambulatory Visit: Payer: Self-pay

## 2020-04-09 ENCOUNTER — Ambulatory Visit (INDEPENDENT_AMBULATORY_CARE_PROVIDER_SITE_OTHER): Payer: Medicaid Other | Admitting: Cardiology

## 2020-04-09 VITALS — BP 110/71 | HR 92 | Ht 72.0 in | Wt 319.2 lb

## 2020-04-09 DIAGNOSIS — I2510 Atherosclerotic heart disease of native coronary artery without angina pectoris: Secondary | ICD-10-CM

## 2020-04-09 DIAGNOSIS — Z9989 Dependence on other enabling machines and devices: Secondary | ICD-10-CM

## 2020-04-09 DIAGNOSIS — G4733 Obstructive sleep apnea (adult) (pediatric): Secondary | ICD-10-CM | POA: Diagnosis not present

## 2020-04-09 MED ORDER — POTASSIUM CHLORIDE CRYS ER 20 MEQ PO TBCR: 40.0000 meq | EXTENDED_RELEASE_TABLET | Freq: Every day | ORAL | 3 refills | Status: DC

## 2020-04-09 MED ORDER — NITROGLYCERIN 0.4 MG SL SUBL: 0.4000 mg | SUBLINGUAL_TABLET | SUBLINGUAL | 3 refills | Status: DC | PRN

## 2020-04-09 MED ORDER — ATORVASTATIN CALCIUM 20 MG PO TABS: 20.0000 mg | ORAL_TABLET | Freq: Every day | ORAL | 3 refills | Status: DC

## 2020-04-09 MED ORDER — METOLAZONE 2.5 MG PO TABS
2.5000 mg | ORAL_TABLET | Freq: Every day | ORAL | 1 refills | Status: DC
Start: 2020-04-09 — End: 2020-05-21

## 2020-04-09 NOTE — Patient Instructions (Signed)

## 2020-04-09 NOTE — Progress Notes (Signed)
Cardiology Office Note:    Date:  04/09/2020   ID:  [name], DOB 05/24/1961, MRN [id number]  PCP:  Street, Sharon Mt, MD  Cardiologist:  Jenean Lindau, MD   Referring MD: [address], Sharon Mt, *    ASSESSMENT:    1. Coronary artery calcification seen on CAT scan   2. Morbid obesity ([place])   3. OSA on CPAP    PLAN:    In order of problems listed above:  1. Coronary artery calcifications: Secondary prevention stressed with the patient.  Importance of compliance with diet medication stressed and he vocalized understanding.  Weight reduction was stressed. 2. Morbid obesity: Weight reduction was stressed and he promises to do better.  Risks of obesity explained. 3. Essential hypertension: Blood pressure is stable 4. Mixed dyslipidemia: On lipid-lowering therapy.  His LDL needs to be less than 70 and he promises to do better.  Currently it is in the 90s. 5. Patient will be seen in follow-up appointment in 6 months or earlier if the patient has any concerns 6. COPD on oxygen: Followed by pulmonologist.   Medication Adjustments/Labs and Tests Ordered: Current medicines are reviewed at length with the patient today.  Concerns regarding medicines are outlined above.  No orders of the defined types were placed in this encounter.  No orders of the defined types were placed in this encounter.    No chief complaint on file.    History of Present Illness:    Taylor Vargas is a 59 y.o. male.  Patient has past medical history of coronary artery calcification, COPD on oxygen, morbid obesity.  He denies any problems at this time and takes care of activities of daily living.  He leads a sedentary lifestyle.  No chest pain orthopnea or PND.  At the time of my evaluation, the patient is alert awake oriented and in no distress.  Past Medical History:  Diagnosis Date  . Abnormal glucose 08/27/2015   Last Assessment & Plan:  Relevant Hx: Course: Daily Update: Today's Plan:will get an a1c  especially given his increased urinary frequency  Electronically signed by: Baldemar Friday, FNP 08/27/15 1513  . Arthritis   . Benign prostatic hyperplasia with lower urinary tract symptoms 08/27/2015   Last Assessment & Plan:  Relevant Hx: Course: Daily Update: Today's Plan:taking flomax for this, but his was presumed, no workup for this diagnosis  Electronically signed by: Baldemar Friday, [place] 08/27/15 1513  . Chest tightness 11/08/2018  . Chronic diastolic heart failure ([place]) 04/21/2019  . Chronic obstructive pulmonary disease ([place]) 08/27/2015   Last Assessment & Plan:  Relevant Hx: Course: Daily Update: Today's Plan:this is likely in acute exacerbation. I have discussed with him that I would like to et cxr to rule out acute pathology. He finally agrees to this. His sat came up to 89% on RA He understands s/e's of the prednisone and if he does have an element of diabetes or prediabtes this could make that worse for him  Electronically sig  . Chronic pain of both knees 01/04/2020   Formatting of this note might be different from the original. Added automatically from request for surgery [id number]  . Congenital absence of one kidney    01-04-2018 per pt unsure which one  . COPD (chronic obstructive pulmonary disease) ([place])    pulmologist-  dr Alcide Clever [name])  . Coronary artery calcification seen on CAT scan 11/30/2019  . GERD (gastroesophageal reflux disease)   . Hypercholesterolemia 09/10/2015  . Hyperglycemia 09/10/2015  .  Malaise and fatigue 08/27/2015   Last Assessment & Plan:  Relevant Hx: Course: Daily Update: Today's Plan:will get labs to rule out acute pathology   Electronically signed by: Baldemar Friday, [place] 08/27/15 1513  . Morbid obesity ([place]) 11/02/2018  . On supplemental oxygen therapy    01-04-2018  per pt prescribed as needed ,  states checks oxygen level daily,  and average between 88-90% on room air,  per pt has not needed O2 past 4 months  . OSA on CPAP   .  Osteoarthritis of right knee 01/11/2020  . Pain in left knee 12/28/2017  . Pancreatic cancer ([place])    01-04-2018  per pt dx 10/ 2009 found by CT imaging, no surgery,  treatment chemotherapy  @ [facility],  in remission since 2017  . Solitary thyroid nodule 09/10/2015    Past Surgical History:  Procedure Laterality Date  . COLONOSCOPY  last one 2019  . KNEE ARTHROSCOPY Right 2002  . KNEE ARTHROSCOPY WITH MEDIAL MENISECTOMY Left 01/06/2018   Procedure: LEFT KNEE ARTHROSCOPY WITH MEDIAL MENISECTOMY;  Surgeon: Rod Can, MD;  Location: [place];  Service: Orthopedics;  Laterality: Left;  . LEFT HEART CATH AND CORONARY ANGIOGRAPHY N/A 11/21/2018   Procedure: LEFT HEART CATH AND CORONARY ANGIOGRAPHY;  Surgeon: Nelva Bush, MD;  Location: [facility];  Service: Cardiovascular;  Laterality: N/A;    Current Medications: Current Meds  Medication Sig  . albuterol (ACCUNEB) 0.63 MG/3ML nebulizer solution Take 1 ampule by nebulization every 6 (six) hours as needed for wheezing.   Marland Kitchen allopurinol (ZYLOPRIM) 100 MG tablet allopurinol 100 mg tablet  TAKE 1 TABLET BY MOUTH DAILY  . ALPRAZolam (XANAX) 0.25 MG tablet Take 0.25 mg by mouth 3 (three) times daily as needed.   Marland Kitchen aspirin EC 81 MG tablet Take 1 tablet (81 mg total) by mouth daily. Swallow whole.  Marland Kitchen atorvastatin (LIPITOR) 20 MG tablet Take 1 tablet (20 mg total) by mouth daily.  . budesonide-formoterol (SYMBICORT) 160-4.5 MCG/ACT inhaler Symbicort 160 mcg-4.5 mcg/actuation HFA aerosol inhaler  INHALE 2 PUFFS BY MOUTH TWICE DAILY  . diclofenac Sodium (VOLTAREN) 1 % GEL Voltaren 1 % topical gel  APPLY 2 GRAM TO THE AFFECTED AREA(S) BY TOPICAL ROUTE 2 or 3 TIMES PER DAY  . fluticasone (FLONASE) 50 MCG/ACT nasal spray Place 1 spray into both nostrils 2 (two) times daily.  . Fluticasone Propionate (XHANCE) 93 MCG/ACT EXHU Inhale 1 spray into the lungs at bedtime.  Marland Kitchen HYDROcodone-acetaminophen (NORCO/VICODIN) 5-325 MG tablet Take  1 tablet by mouth as needed.  Marland Kitchen ibuprofen (ADVIL,MOTRIN) 200 MG tablet Take 200 mg by mouth every 6 (six) hours as needed.  Marland Kitchen ipratropium-albuterol (DUONEB) 0.5-2.5 (3) MG/3ML SOLN ipratropium 0.5 mg-albuterol 3 mg (2.5 mg base)/3 mL nebulization soln  INHALE CONTENTS OF 1 VIAL BY NEBULIZER FOUR TIMES DAILY AS NEEDED  . levofloxacin (LEVAQUIN) 250 MG tablet Take 1 tablet by mouth daily.  . metolazone (ZAROXOLYN) 2.5 MG tablet TAKE 1 TABLET BY MOUTH EVERY DAY  . montelukast (SINGULAIR) 10 MG tablet Take 1 tablet (10 mg total) by mouth at bedtime.  . mupirocin ointment (BACTROBAN) 2 % Apply 1 application topically 2 (two) times daily.  . naloxone (NARCAN) 4 MG/0.1ML LIQD nasal spray kit Narcan 4 mg/actuation nasal spray  USE AS DIRECTED IF POORLY RESPONSIVE OR TURNING BLUE  . nitroGLYCERIN (NITROSTAT) 0.4 MG SL tablet Place 1 tablet (0.4 mg total) under the tongue every 5 (five) minutes as needed.  Marland Kitchen [place]  powder APPLY TO AFFECTED AREAS TWICE DAILY  . omeprazole (PRILOSEC) 20 MG capsule Take 20 mg by mouth 2 (two) times daily.  . Omeprazole 20 MG TBEC Take 20 mg by mouth 2 (two) times daily.   . potassium chloride SA (KLOR-CON) 20 MEQ tablet TAKE 2 TABLETS BY MOUTH EVERY DAY  . PROAIR HFA 108 (90 Base) MCG/ACT inhaler Inhale 2 puffs into the lungs every 4 (four) hours as needed.  . tamsulosin (FLOMAX) 0.4 MG CAPS capsule Take 0.4 mg by mouth daily.  Marland Kitchen tiotropium (SPIRIVA HANDIHALER) 18 MCG inhalation capsule Spiriva with HandiHaler 18 mcg and inhalation capsules  INHALE THE CONTENTS OF 1 CAPSULE ONCE A DA Y  . torsemide (DEMADEX) 10 MG tablet Take 5-10 mg by mouth daily as needed.     Allergies:   Patient has no known allergies.   Social History   Socioeconomic History  . Marital status: Single    Spouse name: Not on file  . Number of children: Not on file  . Years of education: Not on file  . Highest education level: Not on file  Occupational History  . Not on file  Tobacco Use  .  Smoking status: Former Smoker    Packs/day: 2.00    Years: 31.00    Pack years: 62.00    Types: Cigarettes    Quit date: 2018    Years since quitting: 3.8  . Smokeless tobacco: Never Used  Vaping Use  . Vaping Use: Never used  Substance and Sexual Activity  . Alcohol use: Not Currently  . Drug use: Never  . Sexual activity: Not on file  Other Topics Concern  . Not on file  Social History Narrative  . Not on file   Social Determinants of Health   Financial Resource Strain:   . Difficulty of Paying Living Expenses: Not on file  Food Insecurity:   . Worried About Charity fundraiser in the Last Year: Not on file  . Ran Out of Food in the Last Year: Not on file  Transportation Needs:   . Lack of Transportation (Medical): Not on file  . Lack of Transportation (Non-Medical): Not on file  Physical Activity:   . Days of Exercise per Week: Not on file  . Minutes of Exercise per Session: Not on file  Stress:   . Feeling of Stress : Not on file  Social Connections:   . Frequency of Communication with Friends and Family: Not on file  . Frequency of Social Gatherings with Friends and Family: Not on file  . Attends Religious Services: Not on file  . Active Member of Clubs or Organizations: Not on file  . Attends Archivist Meetings: Not on file  . Marital Status: Not on file     Family History: The patient's family history includes Diabetes in his mother.  ROS:   Please see the history of present illness.    All other systems reviewed and are negative.  EKGs/Labs/Other Studies Reviewed:    The following studies were reviewed today: I discussed my findings with the patient at extensive length   Recent Labs: 11/30/2019: BUN 17; Creatinine, Ser 1.22; Potassium 3.6; Sodium 137; TSH 2.360 02/27/2020: ALT 17  Recent Lipid Panel    Component Value Date/Time   CHOL 161 02/27/2020 0944   TRIG 91 02/27/2020 0944   HDL 48 02/27/2020 0944   CHOLHDL 3.4 02/27/2020 0944     LDLCALC 96 02/27/2020 0944    Physical Exam:  VS:  BP 110/71   Pulse 92   Ht 6' (1.829 m)   Wt (!) 319 lb 3.2 oz (144.8 kg)   SpO2 94%   BMI 43.29 kg/m     Wt Readings from Last 3 Encounters:  04/09/20 (!) 319 lb 3.2 oz (144.8 kg)  04/02/20 (!) 320 lb (145.2 kg)  03/04/20 (!) 322 lb 3.2 oz (146.1 kg)     GEN: Patient is in no acute distress HEENT: Normal NECK: No JVD; No carotid bruits LYMPHATICS: No lymphadenopathy CARDIAC: Hear sounds regular, 2/6 systolic murmur at the apex. RESPIRATORY:  Clear to auscultation without rales, wheezing or rhonchi  ABDOMEN: Soft, non-tender, non-distended MUSCULOSKELETAL:  No edema; No deformity  SKIN: Warm and dry NEUROLOGIC:  Alert and oriented x 3 PSYCHIATRIC:  Normal affect   Signed, Jenean Lindau, MD  04/09/2020 10:18 AM    [facility]

## 2020-04-23 ENCOUNTER — Emergency Department (HOSPITAL_COMMUNITY)
Admission: EM | Admit: 2020-04-23 | Discharge: 2020-04-23 | Disposition: A | Discharge status: Home / Self Care | Payer: Medicaid Other | Attending: Emergency Medicine | Admitting: Emergency Medicine

## 2020-04-23 ENCOUNTER — Other Ambulatory Visit: Payer: Self-pay

## 2020-04-23 ENCOUNTER — Encounter (HOSPITAL_COMMUNITY): Payer: Self-pay | Admitting: Emergency Medicine

## 2020-04-23 DIAGNOSIS — Z5321 Procedure and treatment not carried out due to patient leaving prior to being seen by health care provider: Secondary | ICD-10-CM | POA: Diagnosis not present

## 2020-04-23 DIAGNOSIS — R112 Nausea with vomiting, unspecified: Secondary | ICD-10-CM | POA: Diagnosis not present

## 2020-04-23 DIAGNOSIS — R197 Diarrhea, unspecified: Secondary | ICD-10-CM | POA: Diagnosis not present

## 2020-04-23 DIAGNOSIS — R109 Unspecified abdominal pain: Secondary | ICD-10-CM | POA: Diagnosis present

## 2020-04-23 LAB — CBC
HCT: 55.8 % — ABNORMAL HIGH (ref 39.0–52.0)
Hemoglobin: 18.8 g/dL — ABNORMAL HIGH (ref 13.0–17.0)
MCH: 29.9 pg (ref 26.0–34.0)
MCHC: 33.7 g/dL (ref 30.0–36.0)
MCV: 88.7 fL (ref 80.0–100.0)
Platelets: 232 10*3/uL (ref 150–400)
RBC: 6.29 MIL/uL — ABNORMAL HIGH (ref 4.22–5.81)
RDW: 13.8 % (ref 11.5–15.5)
WBC: 12.1 10*3/uL — ABNORMAL HIGH (ref 4.0–10.5)
nRBC: 0 % (ref 0.0–0.2)

## 2020-04-23 LAB — COMPREHENSIVE METABOLIC PANEL
ALT: 37 U/L (ref 0–44)
AST: 27 U/L (ref 15–41)
Albumin: 3.3 g/dL — ABNORMAL LOW (ref 3.5–5.0)
Alkaline Phosphatase: 68 U/L (ref 38–126)
Anion gap: 17 — ABNORMAL HIGH (ref 5–15)
BUN: 24 mg/dL — ABNORMAL HIGH (ref 8–23)
CO2: 24 mmol/L (ref 22–32)
Calcium: 8.4 mg/dL — ABNORMAL LOW (ref 8.9–10.3)
Chloride: 87 mmol/L — ABNORMAL LOW (ref 98–111)
Creatinine, Ser: 1.69 mg/dL — ABNORMAL HIGH (ref 0.61–1.24)
GFR, Estimated: 46 mL/min — ABNORMAL LOW (ref >60)
Glucose, Bld: 120 mg/dL — ABNORMAL HIGH (ref 70–99)
Potassium: 2.9 mmol/L — ABNORMAL LOW (ref 3.5–5.1)
Sodium: 128 mmol/L — ABNORMAL LOW (ref 135–145)
Total Bilirubin: 1.3 mg/dL — ABNORMAL HIGH (ref 0.3–1.2)
Total Protein: 7.3 g/dL (ref 6.5–8.1)

## 2020-04-23 LAB — LIPASE, BLOOD: Lipase: 33 U/L (ref 11–51)

## 2020-04-23 NOTE — ED Notes (Signed)
Called name x3 in lobby for VS recheck. No response from pt.

## 2020-04-23 NOTE — ED Notes (Signed)
CALLED X 1 not in lobby

## 2020-04-23 NOTE — ED Triage Notes (Signed)
Pt BIB [facility] from home. Complaint of abdominal pain, nausea, vomiting for approx 1 week. Poor oral intake.

## 2020-04-23 NOTE — ED Notes (Signed)
Called name x3 for VS recheck. No response from pt.

## 2020-04-25 NOTE — Telephone Encounter (Signed)
Outcome N/Aon October 20 We received a prior authorization request for the member and product listed above. The [facility] Prior Authorization Team is not able to review this request because the brand name for the requested product is preferred on the formulary and is covered without prior authorization requirements. Please have the dispensing pharmacy fill the prescription using the brand name version. If this request is for the generic version, please resubmit this request indicating such, along with supporting documentations for coverage exception review.The pharmacy may obtain assistance by contacting the [facility] Help Desk anytime at [phone]. [facility] pharmacists, please call [phone].

## 2020-04-26 ENCOUNTER — Telehealth: Payer: Self-pay | Admitting: Pulmonary Disease

## 2020-04-26 NOTE — Telephone Encounter (Signed)
Message sent to [employer] 

## 2020-04-29 NOTE — Telephone Encounter (Signed)
I have sent this message to [employer] waiting on there response

## 2020-04-29 NOTE — Telephone Encounter (Signed)
I sent a message to [employer] they have sent me back a message and are needing this   I just received and email on this. Looks like we are requesting the following items.   1. Diagnostic Sleep Study. Insurance requires [place] to have the patient Diagnostic Sleep Study. We have the Titration study(04/06/2019  2. recent office notes that patient is using and benefitting.

## 2020-04-29 NOTE — Telephone Encounter (Signed)
Pt had polysomnography 4 or more parameters with CPAP performed 04/10/20 and this is able to be seen in procedures tab of pt's chart.  Pt had an OV with Dr. Halford Chessman on 04/02/20 and this info was stated in that visit:  Obstructive sleep apnea. - his current machine is not functioning properly and not amenable to repair - he has not received proper service from [facility] and he reports that have not been providing replacement supplies - will try to arrange for a new DME to get new auto CPAP machine, and new CPAP supplies Follow up:  Patient Instructions  Will try to arrange for a new home care company for new CPAP machine and oxygen supplies Follow up in 3 months   In pt's first visit with Dr. Halford Chessman 03/04/20 which was a consult, Dr. Halford Chessman stated this in that visit:  He has been using CPAP at night with 3 liters oxygen.  He hasn't been able to use his CPPA recently because his machine broke. Obstructive sleep apnea. - his current machine is not functioning properly and not amenable to repair - uses [facility] for his DME - will try to arrange for new CPAP machine

## 2020-05-14 NOTE — Telephone Encounter (Signed)
I have sent a message to [facility] to see what the response is

## 2020-05-14 NOTE — Telephone Encounter (Signed)
Taylor Vargas, please advise on update. Thanks.

## 2020-05-15 NOTE — Telephone Encounter (Signed)
[facility] has every thing they need to process the order

## 2020-05-21 ENCOUNTER — Other Ambulatory Visit: Payer: Self-pay | Admitting: Cardiology

## 2020-05-21 NOTE — Telephone Encounter (Signed)
Rx refill sent to pharmacy. 

## 2020-06-17 ENCOUNTER — Telehealth: Payer: Self-pay | Admitting: Pulmonary Disease

## 2020-06-17 NOTE — Telephone Encounter (Signed)
I have called and spoke with Melissa with [employer] and she is going to check on this order for the pt to see where we are at with the process.

## 2020-06-17 NOTE — Telephone Encounter (Signed)
Melissa called back and stated that the pt has the [employer] machine with [facility] and he got this in 2019.  She said that he would need to register this one for the recall and that they would only be able to get the supplies to the pt for this machine but would have to have a copy of the actual diagnostic sleep study.    I called the pt and advised of him of what Melissa stated and he stated that VS told him that he would be able to get the new machine. He stated that he did register his machine for the recall but has not heard anything back from that.  He did have to buy some filters on line since [facility] sent him 2 of the masks and no filters.  He is not pleased with their service at all but said that he has to have a cpap machine to sleep.  He stated that he did not understand why the insurance would not cover a new machine if this one has been recalled and he cannot get a replacement.  VS any other ideas?  Thanks

## 2020-06-18 NOTE — Telephone Encounter (Signed)
He should contact [employer] to determine the status of his replacement machine.

## 2020-06-18 NOTE — Telephone Encounter (Signed)
Called and spoke with patient, advised of recommendations per Dr. Craige Cotta.  Patient states he contacted the company and registered his machine.  I advised him that was the best way to get updates regarding his machine replacement as [employer] has to replace it d/t the recall.  Patient states he has had his machine since June of 2018 and is due for a new machine.  I let him know that according to [employer], he received his machine in 2019 and they only replace them every 5 years.  He states he has the documentation from where he purchased the machine.  I advised him to contact [employer] or take the documentation to the store.  He states he is disabled and cannot take it to the store.  He states he is depending on [place] to help him with this problem and feels like he does not know what else to do.  I advised him that I would contact Melissa, give her the information regarding his machine and ask that she contact him.  He was appreciative.  Called and spoke with Melissa with [employer], she states he received his cpap from [facility] and he can try contacting them to see if they can offer him a loner until he can get his replacement.  Even if he received his CPAP in 2018, he has still not had it for 5 years.  Called and spoke with [facility] from [facility], she stated he purchased the CPAP machine March 20, 2016 and will not be eligible for a new machine until March 20, 2021 except through the recall and will have to go through the [employer] to have it replaced.   Called and spoke with patient, advised of information per [facility].  He wants to make sure that when he is eligible for a new machine that he can get it.  He states he will likely not receive a replacement from [employer] before he is due for a new machine.  He states that his seals are worn out, the latch pops off and the machine cuts off in the middle of the night.  He states he has reported this to [employer] and has not been  addressed.   Called [facility], spoke with Beth, I let her know the issues with his machine and she stated there is nothing they can do regarding the issues.  I advised her he had already made [employer] aware and he cannot speak with a person.  She had no suggestions other than putting something over the latch to keep it from popping open.  Unable to fully address issue despite best efforts.

## 2020-07-18 ENCOUNTER — Other Ambulatory Visit: Payer: Self-pay | Admitting: Cardiology

## 2020-07-18 NOTE — Telephone Encounter (Signed)
Refill sent to pharmacy.   

## 2020-07-30 ENCOUNTER — Other Ambulatory Visit: Payer: Self-pay

## 2020-07-30 ENCOUNTER — Telehealth: Payer: Self-pay | Admitting: Cardiology

## 2020-07-30 MED ORDER — NITROGLYCERIN 0.4 MG SL SUBL: 0.4000 mg | SUBLINGUAL_TABLET | SUBLINGUAL | 7 refills | Status: DC | PRN

## 2020-07-30 MED ORDER — POTASSIUM CHLORIDE CRYS ER 20 MEQ PO TBCR: 40.0000 meq | EXTENDED_RELEASE_TABLET | Freq: Every day | ORAL | 1 refills | Status: DC

## 2020-07-30 MED ORDER — METOLAZONE 2.5 MG PO TABS: 2.5000 mg | ORAL_TABLET | Freq: Every day | ORAL | 1 refills | Status: DC

## 2020-07-30 MED ORDER — ATORVASTATIN CALCIUM 20 MG PO TABS: 20.0000 mg | ORAL_TABLET | Freq: Every day | ORAL | 1 refills | Status: DC

## 2020-07-30 NOTE — Telephone Encounter (Signed)
Refills of Atorvastatin 20 mg, Metolazone 2.5 mg, Nitroglycerin 0.4 mg, and Potassum Chloride 20 mEq sent to [facility]

## 2020-07-30 NOTE — Telephone Encounter (Signed)
*  STAT* If patient is at the pharmacy, call can be transferred to refill team.   1. Which medications need to be refilled? (please list name of each medication and dose if known)   Patient states he does not know the names of the medications prescribed by Dr. Geraldo Pitter and he currently does not have access to them.  2. Which pharmacy/location (including street and city if local pharmacy) is medication to be sent to? [facility] [phone] - [place], [address] AT [address]  3. Do they need a 30 day or 90 day supply? 90 day supply

## 2020-08-05 ENCOUNTER — Encounter: Payer: Self-pay | Admitting: Pulmonary Disease

## 2020-08-05 ENCOUNTER — Other Ambulatory Visit: Payer: Self-pay

## 2020-08-05 ENCOUNTER — Ambulatory Visit (INDEPENDENT_AMBULATORY_CARE_PROVIDER_SITE_OTHER): Payer: Medicaid Other | Admitting: Pulmonary Disease

## 2020-08-05 VITALS — BP 102/62 | HR 98 | Temp 98.2°F | Ht 72.0 in | Wt 319.6 lb

## 2020-08-05 DIAGNOSIS — J449 Chronic obstructive pulmonary disease, unspecified: Secondary | ICD-10-CM | POA: Diagnosis not present

## 2020-08-05 DIAGNOSIS — J9611 Chronic respiratory failure with hypoxia: Secondary | ICD-10-CM | POA: Diagnosis not present

## 2020-08-05 MED ORDER — AZELASTINE HCL 0.1 % NA SOLN: 1.0000 | Freq: Two times a day (BID) | NASAL | 3 refills | Status: DC

## 2020-08-05 MED ORDER — FLUTICASONE PROPIONATE 50 MCG/ACT NA SUSP
1.0000 | Freq: Two times a day (BID) | NASAL | 3 refills | Status: DC
Start: 2020-08-05 — End: 2020-11-04

## 2020-08-05 MED ORDER — BUDESONIDE-FORMOTEROL FUMARATE 160-4.5 MCG/ACT IN AERO
INHALATION_SPRAY | RESPIRATORY_TRACT | 3 refills | Status: DC
Start: 2020-08-05 — End: 2020-09-23

## 2020-08-05 MED ORDER — PROAIR HFA 108 (90 BASE) MCG/ACT IN AERS: 2.0000 | INHALATION_SPRAY | RESPIRATORY_TRACT | 3 refills | Status: DC | PRN

## 2020-08-05 MED ORDER — MONTELUKAST SODIUM 10 MG PO TABS
10.0000 mg | ORAL_TABLET | Freq: Every day | ORAL | 3 refills | Status: DC
Start: 2020-08-05 — End: 2020-11-04

## 2020-08-05 MED ORDER — SPIRIVA HANDIHALER 18 MCG IN CAPS
ORAL_CAPSULE | RESPIRATORY_TRACT | 3 refills | Status: DC
Start: 2020-08-05 — End: 2020-09-23

## 2020-08-05 MED ORDER — ALBUTEROL SULFATE 0.63 MG/3ML IN NEBU: 1.0000 | INHALATION_SOLUTION | Freq: Four times a day (QID) | RESPIRATORY_TRACT | 3 refills | Status: DC | PRN

## 2020-08-05 NOTE — Progress Notes (Signed)
[facility]  Chief Complaint  Patient presents with  . Follow-up    Productive cough with clear phlegm, shortness of breath with activity. Having issues with current CPAP    Constitutional:  BP 102/62 (BP Location: Right Arm, Cuff Size: Normal)   Pulse 98   Temp 98.2 F (36.8 C) (Temporal)   Ht 6' (1.829 m)   Wt (!) 319 lb 9.6 oz (145 kg)   SpO2 95% Comment: 3L Pulse O2  BMI 43.35 kg/m   Past Medical History:  Solitary kidney, Chronic pain, GERD, Genital warts, Plantar fasciitis, Thyroid nodule, Pancreatic cancer, Colon cancer, Bladder cancer  Past Surgical History:  He  has a past surgical history that includes Knee arthroscopy (Right, 2002); Colonoscopy (last one 2019); Knee arthroscopy with medial menisectomy (Left, 01/06/2018); and LEFT HEART CATH AND CORONARY ANGIOGRAPHY (N/A, 11/21/2018).  Brief Summary:  Taylor Vargas is a 60 y.o. male former smoker with COPD, obstructive sleep apnea, and chronic hypoxic respiratory failure.       Subjective:   Still hasn't received his new CPAP.  Has Respironics device.  Battery on POC isn't lasting long.  Still gets sinus congestion and PND.  Winded easily.  Has some cough and congestion.  Physical Exam:   Appearance - well kempt, wearing oxygen  ENMT - no sinus tenderness, no oral exudate, no LAN, Mallampati 3 airway, no stridor  Respiratory - decreased breath sounds bilaterally, no wheezing or rales  CV - s1s2 regular rate and rhythm, no murmurs  Ext - no clubbing, no edema  Skin - no rashes  Psych - normal mood and affect   Pulmonary testing:   PFT 12/22/19 >> FEV1 0.57 (19%), FEV1% 88, TLC 5.55 (76%), DLCO 32%  Chest Imaging:   CT chest 11/01/19 >> moderate centrilobular and paraseptal emphysema, subcarinal LN 1.2 cm, Rt juxta esophageal LN 1.1 cm  CT chest 03/14/20 >> moderate centrilobular emphysema, 3 mm nodule RUL new, 3 mm nodule RUL new, stable LAN  Sleep Tests:    PSG 04/06/19 >> AHI 8.9, SpO2 low 71%, CPAP 16 cm H2O  CPAP 04/06/20 to 07/04/20 >> used on 90 of 90 nights with average 13 hrs 8 min.  Average AHI 1.2 with CPAP 16 cm H2O  Cardiac Tests:   LHC 11/21/18 >> non obstructive CAD  Echo 11/25/18 >> EF 60 to 65%  Social History:  He  reports that he quit smoking about 4 years ago. His smoking use included cigarettes. He has a 62.00 pack-year smoking history. He has never used smokeless tobacco. He reports previous alcohol use. He reports that he does not use drugs.  Family History:  His family history includes Diabetes in his mother.     Assessment/Plan:   COPD with emphysema and chronic bronchitis. - reviewed his medication list in detail - continue spiriva, symbicort, singulair - prn albuterol  Rt upper lobe lung nodules. - will arrange for CT chest w/o contrast in April 2022  Obstructive sleep apnea. - his current machine is not functioning properly and not amenable to repair - he uses [facility] for his DME - he is still waiting for replacement Respironics machine - continue CPAP 16 cm H2O  Chronic respiratory failure with hypoxia. - from COPD and sleep disordered breathing - uses 2 to 4 liters oxygen during the day and 3 liters with CPAP at night - will have his DME arrange for new POC battery  Chronic rhinitis. - continue fluticasone -  add azelastine - continue montelukast for now      Time Spent Involved in Patient Care on Day of Examination:  32 minutes  Follow up:  Patient Instructions  Azelastine nasal spray one spray in each nostril twice per day Fluticasone nasal spray one spray in each nostril once per day Spiriva one puff daily Symbicort two puffs twice per day, and rinse your mouth after each use Montelukast 10 mg nightly Albuterol every 4 to 6 hours as needed for cough, wheeze, or chest congestion Will have your home care company arrange for a new oxygen concentrator battery You are  scheduled to have a follow up CT chest in April 2022  Will follow up in May 2022   Medication List:   Allergies as of 08/05/2020   No Known Allergies     Medication List       Accurate as of August 05, 2020 11:36 AM. If you have any questions, ask your nurse or doctor.        STOP taking these medications   ipratropium-albuterol 0.5-2.5 (3) MG/3ML Soln Commonly known as: DUONEB Stopped by: [name], MD   levofloxacin 250 MG tablet Commonly known as: LEVAQUIN Stopped by: [name], MD     TAKE these medications   albuterol 0.63 MG/3ML nebulizer solution Commonly known as: ACCUNEB Take 3 mLs (0.63 mg total) by nebulization every 6 (six) hours as needed for wheezing or shortness of breath. What changed: reasons to take this Changed by: [name], MD   ProAir HFA 108 [id number] Base) MCG/ACT inhaler Generic drug: albuterol Inhale 2 puffs into the lungs every 4 (four) hours as needed for wheezing or shortness of breath. What changed: reasons to take this Changed by: [name], MD   allopurinol 100 MG tablet Commonly known as: ZYLOPRIM allopurinol 100 mg tablet  TAKE 1 TABLET BY MOUTH DAILY   ALPRAZolam 0.25 MG tablet Commonly known as: XANAX Take 0.25 mg by mouth 3 (three) times daily as needed.   aspirin EC 81 MG tablet Take 1 tablet (81 mg total) by mouth daily. Swallow whole.   atorvastatin 20 MG tablet Commonly known as: LIPITOR Take 1 tablet (20 mg total) by mouth daily.   azelastine 0.1 % nasal spray Commonly known as: ASTELIN Place 1 spray into both nostrils 2 (two) times daily. Use in each nostril as directed Started by: [name], MD   budesonide-formoterol 160-4.5 MCG/ACT inhaler Commonly known as: Symbicort Symbicort 160 mcg-4.5 mcg/actuation HFA aerosol inhaler  INHALE 2 PUFFS BY MOUTH TWICE DAILY   diclofenac Sodium 1 % Gel Commonly known as: VOLTAREN Voltaren 1 % topical gel  APPLY 2 GRAM TO THE AFFECTED AREA(S) BY TOPICAL ROUTE 2 or 3  TIMES PER DAY   fluticasone 50 MCG/ACT nasal spray Commonly known as: FLONASE Place 1 spray into both nostrils 2 (two) times daily. What changed: Another medication with the same name was removed. Continue taking this medication, and follow the directions you see here. Changed by: [name], MD   HYDROcodone-acetaminophen 5-325 MG tablet Commonly known as: NORCO/VICODIN Take 1 tablet by mouth as needed.   ibuprofen 200 MG tablet Commonly known as: ADVIL Take 200 mg by mouth every 6 (six) hours as needed.   metolazone 2.5 MG tablet Commonly known as: ZAROXOLYN Take 1 tablet (2.5 mg total) by mouth daily.   montelukast 10 MG tablet Commonly known as: SINGULAIR Take 1 tablet (10 mg total) by mouth at bedtime.   mupirocin ointment 2 %  Commonly known as: BACTROBAN Apply 1 application topically 2 (two) times daily.   Narcan 4 MG/0.1ML Liqd nasal spray kit Generic drug: naloxone Narcan 4 mg/actuation nasal spray  USE AS DIRECTED IF POORLY RESPONSIVE OR TURNING BLUE   nitroGLYCERIN 0.4 MG SL tablet Commonly known as: NITROSTAT Place 1 tablet (0.4 mg total) under the tongue every 5 (five) minutes as needed.   Nyamyc powder Generic drug: nystatin APPLY TO AFFECTED AREAS TWICE DAILY   Omeprazole 20 MG Tbec Take 20 mg by mouth 2 (two) times daily.   omeprazole 20 MG capsule Commonly known as: PRILOSEC Take 20 mg by mouth 2 (two) times daily.   omeprazole 40 MG capsule Commonly known as: PRILOSEC Take 40 mg by mouth daily.   potassium chloride SA 20 MEQ tablet Commonly known as: KLOR-CON Take 2 tablets (40 mEq total) by mouth daily.   Spiriva HandiHaler 18 MCG inhalation capsule Generic drug: tiotropium Spiriva with HandiHaler 18 mcg and inhalation capsules  INHALE THE CONTENTS OF 1 CAPSULE ONCE A DA Y   tamsulosin 0.4 MG Caps capsule Commonly known as: FLOMAX Take 0.4 mg by mouth daily.   torsemide 10 MG tablet Commonly known as: DEMADEX Take 5-10 mg by mouth  daily as needed.       Signature:  [name], MD [place] Pager - [phone] 08/05/2020, 11:36 AM

## 2020-08-05 NOTE — Patient Instructions (Addendum)
Azelastine nasal spray one spray in each nostril twice per day Fluticasone nasal spray one spray in each nostril once per day Spiriva one puff daily Symbicort two puffs twice per day, and rinse your mouth after each use Montelukast 10 mg nightly Albuterol every 4 to 6 hours as needed for cough, wheeze, or chest congestion Will have your home care company arrange for a new oxygen concentrator battery You are scheduled to have a follow up CT chest in April 2022  Will follow up in May 2022

## 2020-08-18 ENCOUNTER — Other Ambulatory Visit: Payer: Self-pay | Admitting: Cardiology

## 2020-08-19 NOTE — Telephone Encounter (Signed)
Rx refill sent to pharmacy. 

## 2020-08-29 ENCOUNTER — Telehealth: Payer: Self-pay | Admitting: Cardiology

## 2020-08-29 MED ORDER — NITROGLYCERIN 0.4 MG SL SUBL: 0.4000 mg | SUBLINGUAL_TABLET | SUBLINGUAL | 7 refills | Status: DC | PRN

## 2020-08-29 NOTE — Telephone Encounter (Signed)
Pt says his scripts are getting sent to the [facility] on [facility] and they are supposed to go to [address]. He would also like the doctor to prescribe a three month supply like he was before instead of a one month supply.  Best number to contact- [phone]  Thank you!   Enrigue Catena

## 2020-08-29 NOTE — Telephone Encounter (Signed)
Spoke with patient and informed all of his medications except his Nitroglycerin have been sent to the [facility] on [address].  For some reason the Nitroglycerin got sent to the [facility] on [facility].  I informed him I would send a prescription of it to the location on [address] and delete the other pharmacy out of his chart.  I also informed the patient that all of his medications had been filled for 90 days with exception of the Nitro.  I informed him the reasoning we do not refill those in 90 day increments is due to the medication having to be thrown out or wasted.  He thanked me for the call and had no further questions.

## 2020-08-30 ENCOUNTER — Telehealth: Payer: Self-pay | Admitting: Pulmonary Disease

## 2020-08-30 NOTE — Telephone Encounter (Signed)
Medications have been sent to the pharmacy for 3 month supply.

## 2020-09-18 ENCOUNTER — Ambulatory Visit
Admission: RE | Admit: 2020-09-18 | Discharge: 2020-09-18 | Disposition: A | Discharge status: Home / Self Care | Payer: Medicaid Other | Source: Ambulatory Visit | Attending: Pulmonary Disease | Admitting: Pulmonary Disease

## 2020-09-18 DIAGNOSIS — R918 Other nonspecific abnormal finding of lung field: Secondary | ICD-10-CM

## 2020-09-19 ENCOUNTER — Telehealth: Payer: Self-pay

## 2020-09-19 NOTE — Telephone Encounter (Signed)
Called and spoke with patient regarding CT results.   Patient states for the past couple of weeks he has felt like he was getting a sinus infection. States it has worsen over the past 48 hours. C/o lack of energy, cough, green phlegm, stuffy nose especially in the morning. Patient would like something called in for a sinus infection  VS please advise

## 2020-09-20 MED ORDER — AMOXICILLIN-POT CLAVULANATE 875-125 MG PO TABS: 1.0000 | ORAL_TABLET | Freq: Two times a day (BID) | ORAL | 0 refills | Status: DC

## 2020-09-20 NOTE — Telephone Encounter (Signed)
I have called pt and made him aware of response per VS  I have called the med to his pharm since it is not coming up in Epic  Nothing further needed

## 2020-09-20 NOTE — Telephone Encounter (Signed)
Okay to order for one week.

## 2020-09-20 NOTE — Telephone Encounter (Signed)
Called and spoke with patient. Let him know Dr. Juanetta Gosling  Used to take panmist LA 80-795mg  tab for his allergies during times when the pollen is high. It is a prescription and wants to know what Dr. Juanetta Gosling thoughts are on ordering this for patient.   Sending to Dr. Halford Chessman for further recommendations.

## 2020-09-20 NOTE — Telephone Encounter (Signed)
Can call in augmentin [phone]  one pill bid for 7 days.  He would need an ROV if not getting better.

## 2020-09-24 ENCOUNTER — Encounter: Payer: Self-pay | Admitting: Cardiology

## 2020-09-24 ENCOUNTER — Ambulatory Visit (INDEPENDENT_AMBULATORY_CARE_PROVIDER_SITE_OTHER): Payer: Medicaid Other | Admitting: Cardiology

## 2020-09-24 ENCOUNTER — Other Ambulatory Visit: Payer: Self-pay

## 2020-09-24 VITALS — BP 112/68 | HR 94 | Ht 72.0 in | Wt 323.0 lb

## 2020-09-24 DIAGNOSIS — E7800 Pure hypercholesterolemia, unspecified: Secondary | ICD-10-CM

## 2020-09-24 DIAGNOSIS — I5032 Chronic diastolic (congestive) heart failure: Secondary | ICD-10-CM

## 2020-09-24 DIAGNOSIS — I2510 Atherosclerotic heart disease of native coronary artery without angina pectoris: Secondary | ICD-10-CM

## 2020-09-24 DIAGNOSIS — J431 Panlobular emphysema: Secondary | ICD-10-CM | POA: Diagnosis not present

## 2020-09-24 DIAGNOSIS — Z9981 Dependence on supplemental oxygen: Secondary | ICD-10-CM

## 2020-09-24 NOTE — Patient Instructions (Signed)

## 2020-09-24 NOTE — Progress Notes (Signed)
Cardiology Office Note:    Date:  09/24/2020   ID:  Taylor Vargas, DOB 1961-02-02, MRN [id number]  PCP:  Street, [place], MD  Cardiologist:  Jenean Lindau, MD   Referring MD: [address] [place], *    ASSESSMENT:    1. Chronic diastolic heart failure ([place])   2. Coronary artery calcification seen on CAT scan   3. Hypercholesterolemia   4. Panlobular emphysema ([place])   5. Morbid obesity ([place])   6. On supplemental oxygen therapy    PLAN:    In order of problems listed above:  1. Primary prevention stressed with the patient.  Importance of compliance with diet medication stressed any vocalized understanding. 2. Coronary artery calcification on CT scan: Secondary prevention stressed.  He understands.  He is trying to take better care of himself and promises to focus on diet and losing weight. 3. Essential hypertension: Blood pressure stable and diet was emphasized. 4. History of congestive heart failure with preserved ejection fraction.:  Diuretic therapy and tolerating well.  He will have blood work with his primary care in the next few days and will send [place] a list of those lab work. 5. COPD on oxygen: Managed by pulmonologist in [place]. 6. Morbid obesity: Weight reduction was stressed.  Risks of obesity emphasized and he promises to do better. 7. Patient will be seen in follow-up appointment in 6 months or earlier if the patient has any concerns  Medication Adjustments/Labs and Tests Ordered: Current medicines are reviewed at length with the patient today.  Concerns regarding medicines are outlined above.  No orders of the defined types were placed in this encounter.  No orders of the defined types were placed in this encounter.    No chief complaint on file.    History of Present Illness:    Taylor Vargas is a 60 y.o. male.  Patient has past medical history of essential hypertension and COPD on oxygen.  He has history of morbid obesity and has gained significant  weight over the past several months.  He leads a sedentary lifestyle because of the same issues.  No chest pain orthopnea or PND.  He has history of dyslipidemia.  He mentions to me that he has not had blood work for some time and plans to see his primary care physician in the next few days for complete physical.  At the time of my evaluation, the patient is alert awake oriented and in no distress.  Past Medical History:  Diagnosis Date  . Abnormal glucose 08/27/2015   Last Assessment & Plan:  Relevant Hx: Course: Daily Update: Today's Plan:will get an a1c especially given his increased urinary frequency  Electronically signed by: Baldemar Friday, FNP 08/27/15 1513  . Arthritis   . Benign prostatic hyperplasia with lower urinary tract symptoms 08/27/2015   Last Assessment & Plan:  Relevant Hx: Course: Daily Update: Today's Plan:taking flomax for this, but his was presumed, no workup for this diagnosis  Electronically signed by: Baldemar Friday, [place] 08/27/15 1513  . Chest tightness 11/08/2018  . Chronic diastolic heart failure ([place]) 04/21/2019  . Chronic obstructive pulmonary disease ([place]) 08/27/2015   Last Assessment & Plan:  Relevant Hx: Course: Daily Update: Today's Plan:this is likely in acute exacerbation. I have discussed with him that I would like to et cxr to rule out acute pathology. He finally agrees to this. His sat came up to 89% on RA He understands s/e's of the prednisone and if he does have  an element of diabetes or prediabtes this could make that worse for him  Electronically sig  . Chronic pain of both knees 01/04/2020   Formatting of this note might be different from the original. Added automatically from request for surgery [id number]  . Congenital absence of one kidney    01-04-2018 per pt unsure which one  . COPD (chronic obstructive pulmonary disease) ([place])    pulmologist-  dr Alcide Clever [name])  . Coronary artery calcification seen on CAT scan 11/30/2019  . GERD  (gastroesophageal reflux disease)   . Hypercholesterolemia 09/10/2015  . Hyperglycemia 09/10/2015  . Malaise and fatigue 08/27/2015   Last Assessment & Plan:  Relevant Hx: Course: Daily Update: Today's Plan:will get labs to rule out acute pathology   Electronically signed by: Baldemar Friday, [place] 08/27/15 1513  . Morbid obesity ([place]) 11/02/2018  . On supplemental oxygen therapy    01-04-2018  per pt prescribed as needed ,  states checks oxygen level daily,  and average between 88-90% on room air,  per pt has not needed O2 past 4 months  . OSA on CPAP   . Osteoarthritis of right knee 01/11/2020  . Pain in left knee 12/28/2017  . Pancreatic cancer ([place])    01-04-2018  per pt dx 10/ 2009 found by CT imaging, no surgery,  treatment chemotherapy  @ [facility],  in remission since 2017  . Solitary thyroid nodule 09/10/2015    Past Surgical History:  Procedure Laterality Date  . COLONOSCOPY  last one 2019  . KNEE ARTHROSCOPY Right 2002  . KNEE ARTHROSCOPY WITH MEDIAL MENISECTOMY Left 01/06/2018   Procedure: LEFT KNEE ARTHROSCOPY WITH MEDIAL MENISECTOMY;  Surgeon: Rod Can, MD;  Location: [facility];  Service: Orthopedics;  Laterality: Left;  . LEFT HEART CATH AND CORONARY ANGIOGRAPHY N/A 11/21/2018   Procedure: LEFT HEART CATH AND CORONARY ANGIOGRAPHY;  Surgeon: Nelva Bush, MD;  Location: [facility];  Service: Cardiovascular;  Laterality: N/A;    Current Medications: Current Meds  Medication Sig  . albuterol (ACCUNEB) 0.63 MG/3ML nebulizer solution Take 3 mLs (0.63 mg total) by nebulization every 6 (six) hours as needed for wheezing or shortness of breath.  . allopurinol (ZYLOPRIM) 100 MG tablet Take 100 mg by mouth daily.  Marland Kitchen ALPRAZolam (XANAX) 0.25 MG tablet Take 0.25 mg by mouth 3 (three) times daily as needed for anxiety.  Marland Kitchen amoxicillin-clavulanate (AUGMENTIN) 875-125 MG tablet Take 1 tablet by mouth 2 (two) times daily.  Marland Kitchen aspirin EC 81 MG tablet Take 1  tablet (81 mg total) by mouth daily. Swallow whole.  Marland Kitchen atorvastatin (LIPITOR) 20 MG tablet Take 1 tablet (20 mg total) by mouth daily.  Marland Kitchen azelastine (ASTELIN) 0.1 % nasal spray Place 1 spray into both nostrils 2 (two) times daily. Use in each nostril as directed  . budesonide-formoterol (SYMBICORT) 160-4.5 MCG/ACT inhaler Inhale 2 puffs into the lungs 2 (two) times daily.  . diclofenac Sodium (VOLTAREN) 1 % GEL Apply 1 application topically as needed for pain.  . fluticasone (FLONASE) 50 MCG/ACT nasal spray Place 1 spray into both nostrils 2 (two) times daily.  Marland Kitchen HYDROcodone-acetaminophen (NORCO/VICODIN) 5-325 MG tablet Take 1 tablet by mouth as needed for pain.  Marland Kitchen ibuprofen (ADVIL) 200 MG tablet Take 200 mg by mouth every 6 (six) hours as needed for pain.  . metolazone (ZAROXOLYN) 2.5 MG tablet Take 1 tablet (2.5 mg total) by mouth daily.  . montelukast (SINGULAIR) 10 MG tablet Take 1 tablet (10 mg total)  by mouth at bedtime.  . mupirocin ointment (BACTROBAN) 2 % Apply 1 application topically 2 (two) times daily.  . naloxone (NARCAN) nasal spray 4 mg/0.1 mL Place 1 spray into the nose as needed for opioid reversal.  . nitroGLYCERIN (NITROSTAT) 0.4 MG SL tablet Place 1 tablet (0.4 mg total) under the tongue every 5 (five) minutes as needed for chest pain.  Marland Kitchen omeprazole (PRILOSEC) 40 MG capsule Take 40 mg by mouth daily.  . potassium chloride SA (KLOR-CON) 20 MEQ tablet Take 2 tablets (40 mEq total) by mouth daily.  Marland Kitchen PROAIR HFA 108 (90 Base) MCG/ACT inhaler Inhale 2 puffs into the lungs every 4 (four) hours as needed for wheezing or shortness of breath.  . tamsulosin (FLOMAX) 0.4 MG CAPS capsule Take 0.4 mg by mouth daily.  Marland Kitchen tiotropium (SPIRIVA) 18 MCG inhalation capsule Place 1 capsule into inhaler and inhale daily.  Marland Kitchen torsemide (DEMADEX) 10 MG tablet Take 5-10 mg by mouth as needed for edema.     Allergies:   Patient has no known allergies.   Social History   Socioeconomic History  .  Marital status: Single    Spouse name: Not on file  . Number of children: Not on file  . Years of education: Not on file  . Highest education level: Not on file  Occupational History  . Not on file  Tobacco Use  . Smoking status: Former Smoker    Packs/day: 2.00    Years: 31.00    Pack years: 62.00    Types: Cigarettes    Quit date: 2018    Years since quitting: 4.2  . Smokeless tobacco: Never Used  Vaping Use  . Vaping Use: Never used  Substance and Sexual Activity  . Alcohol use: Not Currently  . Drug use: Never  . Sexual activity: Not on file  Other Topics Concern  . Not on file  Social History Narrative  . Not on file   Social Determinants of Health   Financial Resource Strain: Not on file  Food Insecurity: Not on file  Transportation Needs: Not on file  Physical Activity: Not on file  Stress: Not on file  Social Connections: Not on file     Family History: The patient's family history includes Diabetes in his mother.  ROS:   Please see the history of present illness.    All other systems reviewed and are negative.  EKGs/Labs/Other Studies Reviewed:    The following studies were reviewed today: I discussed my findings with the patient in extensive length   Recent Labs: 11/30/2019: TSH 2.360 04/23/2020: ALT 37; BUN 24; Creatinine, Ser 1.69; Hemoglobin 18.8; Platelets 232; Potassium 2.9; Sodium 128  Recent Lipid Panel    Component Value Date/Time   CHOL 161 02/27/2020 0944   TRIG 91 02/27/2020 0944   HDL 48 02/27/2020 0944   CHOLHDL 3.4 02/27/2020 0944   LDLCALC 96 02/27/2020 0944    Physical Exam:    VS:  BP 112/68   Pulse 94   Ht 6' (1.829 m)   Wt (!) 323 lb (146.5 kg)   SpO2 92%   BMI 43.81 kg/m     Wt Readings from Last 3 Encounters:  09/24/20 (!) 323 lb (146.5 kg)  08/05/20 (!) 319 lb 9.6 oz (145 kg)  04/23/20 (!) 302 lb (137 kg)     GEN: Patient is in no acute distress HEENT: Normal NECK: No JVD; No carotid bruits LYMPHATICS: No  lymphadenopathy CARDIAC: Hear sounds regular, 2/6 systolic  murmur at the apex. RESPIRATORY:  Clear to auscultation without rales, wheezing or rhonchi  ABDOMEN: Soft, non-tender, non-distended MUSCULOSKELETAL:  No edema; No deformity  SKIN: Warm and dry NEUROLOGIC:  Alert and oriented x 3 PSYCHIATRIC:  Normal affect   Signed, Jenean Lindau, MD  09/24/2020 10:48 AM    [place]

## 2020-09-30 ENCOUNTER — Telehealth: Payer: Self-pay | Admitting: Pulmonary Disease

## 2020-09-30 MED ORDER — PSEUDOEPHEDRINE-GUAIFENESIN ER 120-1200 MG PO TB12: 1.0000 | ORAL_TABLET | Freq: Two times a day (BID) | ORAL | 1 refills | Status: AC

## 2020-09-30 NOTE — Telephone Encounter (Signed)
Okay to send the pseudoephedrine-guaifenesin 120/1200 mg bid prn.  Can send 30 pills with one refill.

## 2020-09-30 NOTE — Telephone Encounter (Signed)
Called and spoke with patient to let him know that VS would be ok with the PanMist RX, he verbalized understanding. I attempted to find the medication in our medication list, I could not find it. [facility] and spoke with the pharmacist. He stated that they do not have it in stock and it was discontinued about 5 years ago. I called the patient to make him aware of this. He wanted to know if VS had any other recommendations for him. He has taken Claritin and Zyrtec before in the past and they have not helped.   When I did a search for PanMist, it stated that it was a combo of pseudoephedrine-guaifenesin. When I searched for the PanMist, I did see an option for pseudoephedrine-guaifenesin 120/1200mg .   VS, please advise. Thanks.

## 2020-09-30 NOTE — Telephone Encounter (Signed)
Primary Pulmonologist: Dr Halford Chessman Last office visit and with whom: 08/05/2020 with Dr Halford Chessman What do we see them for (pulmonary problems): COPD with chronic bronchitis and emphysema Last OV assessment/plan: COPD with emphysema and chronic bronchitis. - reviewed his medication list in detail - continue spiriva, symbicort, singulair - prn albuterol  Rt upper lobe lung nodules. - will arrange for CT chest w/o contrast in April 2022  Obstructive sleep apnea. -his current machine is not functioning properly and not amenable to repair - he uses [facility] for his DME - he is still waiting for replacement Respironics machine - continue CPAP 16 cm H2O  Chronic respiratory failure with hypoxia. - from COPD and sleep disordered breathing - uses 2 to 4 liters oxygen during the day and 3 liters with CPAP at night - will have his DME arrange for new POC battery  Chronic rhinitis. - continue fluticasone - add azelastine - continue montelukast for now    Reason for call: Patient  Stating he is needing 07 42 PAL PanMist LA 800mg /80 mg tab sent into [facility] on [address] in [place].  Patient has nasal drainage and congestion for past 3 weeks,  Non productive cough. Patient completed the Augmentin yesterday but is still having nasal congestion and non productive cough. Patient stating this medication helped the last time he took it.   Dr Halford Chessman please advise    No Known Allergies  Immunization History  Administered Date(s) Administered  . Influenza Inj Mdck Quad Pf 02/27/2020  . Influenza,inj,Quad PF,6+ Mos 04/09/2017  . Influenza,inj,quad, With Preservative 02/26/2019

## 2020-09-30 NOTE — Telephone Encounter (Signed)
Okay to send script for this medication.

## 2020-09-30 NOTE — Telephone Encounter (Signed)
I have sent the rx to the pharmacy per VS recs. I have called the pt and he is aware.   Nothing further is needed.

## 2020-10-07 ENCOUNTER — Ambulatory Visit: Payer: Medicaid Other | Admitting: Cardiology

## 2020-10-18 ENCOUNTER — Other Ambulatory Visit: Payer: Self-pay | Admitting: Cardiology

## 2020-11-04 ENCOUNTER — Telehealth: Payer: Self-pay | Admitting: Pulmonary Disease

## 2020-11-04 ENCOUNTER — Ambulatory Visit (INDEPENDENT_AMBULATORY_CARE_PROVIDER_SITE_OTHER): Payer: Medicaid Other | Admitting: Pulmonary Disease

## 2020-11-04 ENCOUNTER — Encounter: Payer: Self-pay | Admitting: Pulmonary Disease

## 2020-11-04 ENCOUNTER — Other Ambulatory Visit: Payer: Self-pay

## 2020-11-04 VITALS — BP 126/62 | Temp 98.7°F | Ht 72.0 in | Wt 314.8 lb

## 2020-11-04 DIAGNOSIS — J9611 Chronic respiratory failure with hypoxia: Secondary | ICD-10-CM

## 2020-11-04 DIAGNOSIS — J449 Chronic obstructive pulmonary disease, unspecified: Secondary | ICD-10-CM | POA: Diagnosis not present

## 2020-11-04 DIAGNOSIS — Z01811 Encounter for preprocedural respiratory examination: Secondary | ICD-10-CM

## 2020-11-04 MED ORDER — BUDESONIDE-FORMOTEROL FUMARATE 160-4.5 MCG/ACT IN AERO: 2.0000 | INHALATION_SPRAY | Freq: Two times a day (BID) | RESPIRATORY_TRACT | 3 refills | Status: DC

## 2020-11-04 MED ORDER — AZELASTINE HCL 0.1 % NA SOLN: 1.0000 | Freq: Two times a day (BID) | NASAL | 3 refills | Status: DC

## 2020-11-04 MED ORDER — MONTELUKAST SODIUM 10 MG PO TABS: 10.0000 mg | ORAL_TABLET | Freq: Every day | ORAL | 3 refills | Status: DC

## 2020-11-04 MED ORDER — FLUTICASONE PROPIONATE 50 MCG/ACT NA SUSP: 1.0000 | Freq: Two times a day (BID) | NASAL | 3 refills | Status: DC

## 2020-11-04 MED ORDER — PROAIR HFA 108 (90 BASE) MCG/ACT IN AERS: 2.0000 | INHALATION_SPRAY | RESPIRATORY_TRACT | 3 refills | Status: DC | PRN

## 2020-11-04 MED ORDER — TIOTROPIUM BROMIDE MONOHYDRATE 18 MCG IN CAPS: 1.0000 | ORAL_CAPSULE | Freq: Every day | RESPIRATORY_TRACT | 3 refills | Status: DC

## 2020-11-04 NOTE — Patient Instructions (Signed)
Will have [facility] arrange for non-invasive mechanical ventilatory  Follow up in 4 months

## 2020-11-04 NOTE — Progress Notes (Signed)
[facility]  Chief Complaint  Patient presents with  . Follow-up    Not any better,sob-same, cough-clear.Wears CPAP waiting for new machine    Constitutional:  BP 126/62 (BP Location: Right Arm, Cuff Size: Large)   Temp 98.7 F (37.1 C) (Temporal)   Ht 6' (1.829 m)   Wt (!) 314 lb 12.8 oz (142.8 kg)   BMI 42.69 kg/m   Past Medical History:  Solitary kidney, Chronic pain, GERD, Genital warts, Plantar fasciitis, Thyroid nodule, Pancreatic cancer, Colon cancer, Bladder cancer  Past Surgical History:  He  has a past surgical history that includes Knee arthroscopy (Right, 2002); Colonoscopy (last one 2019); Knee arthroscopy with medial menisectomy (Left, 01/06/2018); and LEFT HEART CATH AND CORONARY ANGIOGRAPHY (N/A, 11/21/2018).  Brief Summary:  Taylor Vargas is a 60 y.o. male former smoker with COPD, obstructive sleep apnea, and chronic hypoxic respiratory failure.       Subjective:   He had cystoscopy recently and found to have recurrence of bladder cancer.  He is scheduled for cystoscopy with cup bladder biopsy and fulgeration in June with Dr. Nila Nephew with [facility] (fax# [phone], phone# [phone]).  He still hasn't received new CPAP machine.  He is using 2 to 4 liters oxygen 24/7.    Has cough with clear sputum.  Breathing okay at rest, but gets winded with any activity.  Not having chest pain, fever, or hemoptysis.   Physical Exam:   Appearance - well kempt, sitting on his walker, wearing oxygen  ENMT - no sinus tenderness, no oral exudate, no LAN, Mallampati 3 airway, no stridor  Respiratory - decreased breath sounds bilaterally, no wheezing or rales  CV - s1s2 regular rate and rhythm, no murmurs  Ext - no clubbing, no edema  Skin - no rashes  Psych - normal mood and affect   Pulmonary testing:   PFT 12/22/19 >> FEV1 0.57 (19%), FEV1% 88, TLC 5.55 (76%), DLCO 32%  Chest Imaging:   CT chest  11/01/19 >> moderate centrilobular and paraseptal emphysema, subcarinal LN 1.2 cm, Rt juxta esophageal LN 1.1 cm  CT chest 03/14/20 >> moderate centrilobular emphysema, 3 mm nodule RUL new, 3 mm nodule RUL new, stable LAN  CT chest 09/19/20 >> Rt upper nodule resolved  Sleep Tests:   PSG 04/06/19 >> AHI 8.9, SpO2 low 71%, CPAP 16 cm H2O  CPAP 04/06/20 to 07/04/20 >> used on 90 of 90 nights with average 13 hrs 8 min.  Average AHI 1.2 with CPAP 16 cm H2O  Cardiac Tests:   LHC 11/21/18 >> non obstructive CAD  Echo 11/25/18 >> EF 60 to 65%  Social History:  He  reports that he quit smoking about 4 years ago. His smoking use included cigarettes. He has a 62.00 pack-year smoking history. He has never used smokeless tobacco. He reports previous alcohol use. He reports that he does not use drugs.  Family History:  His family history includes Diabetes in his mother.    Discussion:  Patient continues to exhibit signs of hypercapnia associated with chronic respiratory failure secondary to severe COPD. Interruption or failure to provide NIV would quickly lead to exacerbation of the patient's condition, hospital admission, and likely harm to the patient. Continued use is preferred. The use of the NIV will treat patient's high PC02 levels and can reduce risk of exacerbations and future hospitalizations when used at night and during the day. BiLevel/RAD has been considered and ruled out as patient requires  continuous alarms, backup battery, and portability which are not possible with BiLevel/RAD devices. Ventilation is required to decrease the work of breathing and improve pulmonary status. Interruption of ventilator support would lead to decline of health status. Patient is able to protect their airways and clear secretions on their own.  Assessment/Plan:   COPD with emphysema and chronic bronchitis. - continue spiriva, symbicort, and singulair - prn albuterol  Rt upper lobe lung nodules. - resolved  on CT chest from April 2022 - no additional radiographic follow up needed  Obstructive sleep apnea. - his current machine is not functioning properly and not amenable to repair - he uses [facility] for his DME - he is still waiting for replacement Respironics machine - continue CPAP 16 cm H2O  Chronic respiratory failure with hypoxia. - from COPD and sleep disordered breathing - continue 2 liters oxygen at rest, 4 liters with exertion - he would be better served with use of NIMV in place of CPAP - will have [place] Patient start the process to get approval for NIMV  Chronic rhinitis. - continue fluticasone, azelastine, and montelukast  Chronic diastolic CHF, Coronary calcification. - followed by Dr. Jyl Heinz with [facility]  Recurrent bladder cancer. - his pulmonary status is optimized for procedure with Dr. Nila Nephew - explained that he is at higher risk for post-operative pulmonary complications, but there doesn't seem to be an alternative to him proceeding with cystoscopy     Time Spent Involved in Patient Care on Day of Examination:  33 minutes  Follow up:  Patient Instructions  Will have [facility] Patient arrange for non-invasive mechanical ventilatory  Follow up in 4 months   Medication List:   Allergies as of 11/04/2020   No Known Allergies     Medication List       Accurate as of Nov 04, 2020 10:50 AM. If you have any questions, ask your nurse or doctor.        STOP taking these medications   amoxicillin-clavulanate 875-125 MG tablet Commonly known as: AUGMENTIN Stopped by: [name], MD     TAKE these medications   albuterol 0.63 MG/3ML nebulizer solution Commonly known as: ACCUNEB Take 3 mLs (0.63 mg total) by nebulization every 6 (six) hours as needed for wheezing or shortness of breath.   ProAir HFA 108 (90 Base) MCG/ACT inhaler Generic drug: albuterol Inhale 2 puffs into the lungs every 4 (four) hours as needed  for wheezing or shortness of breath.   allopurinol 100 MG tablet Commonly known as: ZYLOPRIM Take 100 mg by mouth daily.   ALPRAZolam 0.25 MG tablet Commonly known as: XANAX Take 0.25 mg by mouth 3 (three) times daily as needed for anxiety.   aspirin EC 81 MG tablet Take 1 tablet (81 mg total) by mouth daily. Swallow whole.   atorvastatin 20 MG tablet Commonly known as: LIPITOR Take 1 tablet (20 mg total) by mouth daily.   azelastine 0.1 % nasal spray Commonly known as: ASTELIN Place 1 spray into both nostrils 2 (two) times daily. Use in each nostril as directed   budesonide-formoterol 160-4.5 MCG/ACT inhaler Commonly known as: SYMBICORT Inhale 2 puffs into the lungs 2 (two) times daily. What changed: when to take this Changed by: [name], MD   diclofenac Sodium 1 % Gel Commonly known as: VOLTAREN Apply 1 application topically as needed for pain.   fluticasone 50 MCG/ACT nasal spray Commonly known as: FLONASE Place 1 spray into both nostrils 2 (  two) times daily.   HYDROcodone-acetaminophen 5-325 MG tablet Commonly known as: NORCO/VICODIN Take 1 tablet by mouth as needed for pain.   ibuprofen 200 MG tablet Commonly known as: ADVIL Take 200 mg by mouth every 6 (six) hours as needed for pain.   metolazone 2.5 MG tablet Commonly known as: ZAROXOLYN TAKE 1 TABLET BY MOUTH EVERY DAY   montelukast 10 MG tablet Commonly known as: SINGULAIR Take 1 tablet (10 mg total) by mouth at bedtime.   mupirocin ointment 2 % Commonly known as: BACTROBAN Apply 1 application topically 2 (two) times daily.   naloxone 4 MG/0.1ML Liqd nasal spray kit Commonly known as: NARCAN Place 1 spray into the nose as needed for opioid reversal.   nitroGLYCERIN 0.4 MG SL tablet Commonly known as: NITROSTAT Place 1 tablet (0.4 mg total) under the tongue every 5 (five) minutes as needed for chest pain.   omeprazole 40 MG capsule Commonly known as: PRILOSEC Take 40 mg by mouth daily.    potassium chloride SA 20 MEQ tablet Commonly known as: KLOR-CON Take 2 tablets (40 mEq total) by mouth daily.   Pseudoephedrine-Guaifenesin [phone] MG Tb12 Take 1 tablet by mouth in the morning and at bedtime.   tamsulosin 0.4 MG Caps capsule Commonly known as: FLOMAX Take 0.4 mg by mouth daily.   tiotropium 18 MCG inhalation capsule Commonly known as: SPIRIVA Place 1 capsule (18 mcg total) into inhaler and inhale daily. What changed: when to take this Changed by: [name], MD   torsemide 10 MG tablet Commonly known as: DEMADEX Take 5-10 mg by mouth as needed for edema.       Signature:  [name], MD [place] Pager - [phone] 11/04/2020, 10:50 AM

## 2020-11-04 NOTE — Telephone Encounter (Signed)
Attempted to call Sarah with [facility] pt but they are now closed.  Will need to call back tomorrow to find out about these papers that will need to be completed.

## 2020-11-05 NOTE — Telephone Encounter (Signed)
Please arrange for abg on room air.

## 2020-11-05 NOTE — Telephone Encounter (Signed)
Called and spoke with Judson Roch at [employer] and she stated that she got the order for the ventilator.  She stated that his insurance is a [place] and that he is going to need to have an ABG done.  VS please advise. Thanks

## 2020-11-05 NOTE — Telephone Encounter (Signed)
Called and spoke with pt letting him know the info found out from DME and that we needed to have him get ABG done prior to [place] able to get him the vent.  Pt verbalized understanding and was fine with it being ordered. Pt wanted to remind [place] that he is having bladder surgery 6/2.  If possible, would like to have this done before his surgery.  Routing to PCCs.

## 2020-11-05 NOTE — Telephone Encounter (Signed)
I called [facility] & spoke to [facility].  She is going to call pt & schedule ABG.  Nothing further needed.

## 2020-11-19 ENCOUNTER — Other Ambulatory Visit: Payer: Self-pay

## 2020-11-19 ENCOUNTER — Ambulatory Visit (HOSPITAL_COMMUNITY)
Admission: RE | Admit: 2020-11-19 | Discharge: 2020-11-19 | Disposition: A | Discharge status: Home / Self Care | Payer: Medicaid Other | Source: Ambulatory Visit | Attending: Pulmonary Disease | Admitting: Pulmonary Disease

## 2020-11-19 DIAGNOSIS — J9611 Chronic respiratory failure with hypoxia: Secondary | ICD-10-CM | POA: Insufficient documentation

## 2020-11-19 DIAGNOSIS — J449 Chronic obstructive pulmonary disease, unspecified: Secondary | ICD-10-CM | POA: Insufficient documentation

## 2020-11-19 LAB — BLOOD GAS, ARTERIAL
Acid-Base Excess: 4.5 mmol/L — ABNORMAL HIGH (ref 0.0–2.0)
Acid-Base Excess: 5.1 mmol/L — ABNORMAL HIGH (ref 0.0–2.0)
Bicarbonate: 29 mmol/L — ABNORMAL HIGH (ref 20.0–28.0)
Bicarbonate: 29.9 mmol/L — ABNORMAL HIGH (ref 20.0–28.0)
FIO2: 28
FIO2: 28
O2 Saturation: 59.1 %
O2 Saturation: 96.1 %
Patient temperature: 37
Patient temperature: 37
pCO2 arterial: 47.2 mmHg (ref 32.0–48.0)
pCO2 arterial: 51.4 mmHg — ABNORMAL HIGH (ref 32.0–48.0)
pH, Arterial: 7.383 (ref 7.350–7.450)
pH, Arterial: 7.405 (ref 7.350–7.450)
pO2, Arterial: 76.1 mmHg — ABNORMAL LOW (ref 83.0–108.0)
pO2, Arterial: <31 mmHg — CL (ref 83.0–108.0)

## 2020-11-19 NOTE — Progress Notes (Signed)
Patient in today for ABG on 2 LPM [place].  Done and results in computer.  First sample sent was venous.  Another sample obtained and sent to lab.

## 2020-11-25 NOTE — Progress Notes (Signed)
[facility] Patient and spoke with Maudie Mercury and went over Dr Juanetta Gosling recommendation about qualifying for non-invasive mechanical ventilator set up based on his ABG results. Maudie Mercury stated she will look patient up in Epic and Microbiologist back.

## 2020-11-26 NOTE — Progress Notes (Signed)
Non-invasive mechanical ventilator paperwork received from [facility] at [place], Dr Halford Chessman signed and paperwork was faxed back successfully to Dawn/[facility]. Nothing further needed at this time.

## 2020-11-26 NOTE — Progress Notes (Signed)
Dawn from [facility] returned phone call and will be faxing paperwork to [place] office for Dr Halford Chessman to sign for equipment for patient. Dr Halford Chessman made aware.

## 2020-12-06 ENCOUNTER — Telehealth: Payer: Self-pay | Admitting: Pulmonary Disease

## 2020-12-06 NOTE — Telephone Encounter (Signed)
Called and spoke with Dawn with [facility], she states that she and Maudie Mercury spoke with the patient on several occassions and he never mentioned that he already had a ventilator set up.  He is already set up with [place] patient and has been set up since the end of May.  They did not find this out until they sent out a RT from out of another office to go see the patient.  The patient said he thought they were just coming to check on his ventilator, not to set up a ventilator.  Advised that I would let Dr. Halford Chessman know.  Nothing further needed.  [place] Patient to get access to his download, spoke with [place].  I was advised that they do not have a download on him at this time because he has only been on it since 11/12/2020.  Nothing further needed.  Dr. Halford Chessman, This patient is already set up on a ventilator with [place] [facility], it was set up on 11/12/2020.  This is just an [place].

## 2021-01-03 ENCOUNTER — Other Ambulatory Visit: Payer: Self-pay

## 2021-01-03 DIAGNOSIS — I739 Peripheral vascular disease, unspecified: Secondary | ICD-10-CM

## 2021-01-09 ENCOUNTER — Encounter: Payer: Self-pay | Admitting: Vascular Surgery

## 2021-01-09 ENCOUNTER — Ambulatory Visit (INDEPENDENT_AMBULATORY_CARE_PROVIDER_SITE_OTHER): Payer: Medicaid Other | Admitting: Vascular Surgery

## 2021-01-09 ENCOUNTER — Other Ambulatory Visit: Payer: Self-pay

## 2021-01-09 ENCOUNTER — Ambulatory Visit (HOSPITAL_COMMUNITY)
Admission: RE | Admit: 2021-01-09 | Discharge: 2021-01-09 | Disposition: A | Discharge status: Home / Self Care | Payer: Medicaid Other | Source: Ambulatory Visit | Attending: Vascular Surgery | Admitting: Vascular Surgery

## 2021-01-09 VITALS — BP 101/68 | HR 98 | Temp 98.5°F | Resp 20 | Ht 72.0 in | Wt 314.0 lb

## 2021-01-09 DIAGNOSIS — R238 Other skin changes: Secondary | ICD-10-CM

## 2021-01-09 DIAGNOSIS — I739 Peripheral vascular disease, unspecified: Secondary | ICD-10-CM | POA: Diagnosis not present

## 2021-01-09 NOTE — Progress Notes (Signed)
Referring Physician: Dr. Christa See  Patient name: Taylor Vargas MRN: [id number] DOB: [birth year] Sex: male  REASON FOR CONSULT: Dusky discoloration of feet  HPI: Taylor Vargas is a 60 y.o. male, who recently noticed that the bottoms of his feet especially the right 1 become more dusky when he elevates the legs.  Patient has severe COPD and is on home oxygen.  He stated he did not really notice this prior to having a spinal anesthesia for removal of a bladder cancer.  He does not describe claudication.  He does not have rest pain.  He does not have any history of nonhealing wounds on the feet.  Recent CT scan of the abdomen and pelvis showed some aortic atherosclerosis but no significant narrowing.  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  He was recently placed on pentoxifylline for the dusky feet.  He states that he feels he bruises more easily since starting this.  Other medical problems include degenerative arthritis of the knees, elevated cholesterol, obesity, all of which are stable.  He is on aspirin and a statin.  Past Medical History:  Diagnosis Date   Abnormal glucose 08/27/2015   Last Assessment & Plan:  Relevant Hx: Course: Daily Update: Today's Plan:will get an a1c especially given his increased urinary frequency  Electronically signed by: Baldemar Friday, FNP 08/27/15 1513   Arthritis    Benign prostatic hyperplasia with lower urinary tract symptoms 08/27/2015   Last Assessment & Plan:  Relevant Hx: Course: Daily Update: Today's Plan:taking flomax for this, but his was presumed, no workup for this diagnosis  Electronically signed by: Baldemar Friday, [place] 08/27/15 1513   Chest tightness 11/08/2018   Chronic diastolic heart failure ([place]) 04/21/2019   Chronic obstructive pulmonary disease ([place]) 08/27/2015   Last Assessment & Plan:  Relevant Hx: Course: Daily Update: Today's Plan:this is likely in acute exacerbation. I have discussed with him that I would like to et cxr  to rule out acute pathology. He finally agrees to this. His sat came up to 89% on RA He understands s/e's of the prednisone and if he does have an element of diabetes or prediabtes this could make that worse for him  Electronically sig   Chronic pain of both knees 01/04/2020   Formatting of this note might be different from the original. Added automatically from request for surgery [id number]   Congenital absence of one kidney    01-04-2018 per pt unsure which one   COPD (chronic obstructive pulmonary disease) ([place])    pulmologist-  dr Alcide Clever [name])   Coronary artery calcification seen on CAT scan 11/30/2019   GERD (gastroesophageal reflux disease)    Hypercholesterolemia 09/10/2015   Hyperglycemia 09/10/2015   Malaise and fatigue 08/27/2015   Last Assessment & Plan:  Relevant Hx: Course: Daily Update: Today's Plan:will get labs to rule out acute pathology   Electronically signed by: Baldemar Friday, [place] 08/27/15 1513   Morbid obesity ([place]) 11/02/2018   On supplemental oxygen therapy    01-04-2018  per pt prescribed as needed ,  states checks oxygen level daily,  and average between 88-90% on room air,  per pt has not needed O2 past 4 months   OSA on CPAP    Osteoarthritis of right knee 01/11/2020   Pain in left knee 12/28/2017   Pancreatic cancer ([place])    01-04-2018  per pt dx 10/ 2009 found by CT imaging, no surgery,  treatment chemotherapy  @ [facility],  in remission since 2017   Solitary thyroid nodule 09/10/2015   Past Surgical History:  Procedure Laterality Date   COLONOSCOPY  last one 2019   KNEE ARTHROSCOPY Right 2002   KNEE ARTHROSCOPY WITH MEDIAL MENISECTOMY Left 01/06/2018   Procedure: LEFT KNEE ARTHROSCOPY WITH MEDIAL MENISECTOMY;  Surgeon: Rod Can, MD;  Location: [facility];  Service: Orthopedics;  Laterality: Left;   LEFT HEART CATH AND CORONARY ANGIOGRAPHY N/A 11/21/2018   Procedure: LEFT HEART CATH AND CORONARY ANGIOGRAPHY;  Surgeon: Nelva Bush, MD;   Location: [facility];  Service: Cardiovascular;  Laterality: N/A;    Family History  Problem Relation Age of Onset   Diabetes Mother     SOCIAL HISTORY: Social History   Socioeconomic History   Marital status: Single    Spouse name: Not on file   Number of children: Not on file   Years of education: Not on file   Highest education level: Not on file  Occupational History   Not on file  Tobacco Use   Smoking status: Former    Packs/day: 2.00    Years: 31.00    Pack years: 62.00    Types: Cigarettes    Quit date: 2018    Years since quitting: 4.5   Smokeless tobacco: Never  Vaping Use   Vaping Use: Never used  Substance and Sexual Activity   Alcohol use: Not Currently   Drug use: Never   Sexual activity: Not on file  Other Topics Concern   Not on file  Social History Narrative   Not on file   Social Determinants of Health   Financial Resource Strain: Not on file  Food Insecurity: Not on file  Transportation Needs: Not on file  Physical Activity: Not on file  Stress: Not on file  Social Connections: Not on file  Intimate Partner Violence: Not on file    No Known Allergies  Current Outpatient Medications  Medication Sig Dispense Refill   albuterol (ACCUNEB) 0.63 MG/3ML nebulizer solution Take 3 mLs (0.63 mg total) by nebulization every 6 (six) hours as needed for wheezing or shortness of breath. 360 mL 3   allopurinol (ZYLOPRIM) 100 MG tablet Take 100 mg by mouth daily.     ALPRAZolam (XANAX) 0.25 MG tablet Take 0.25 mg by mouth 3 (three) times daily as needed for anxiety.     aspirin EC 81 MG tablet Take 1 tablet (81 mg total) by mouth daily. Swallow whole. 90 tablet 3   atorvastatin (LIPITOR) 20 MG tablet Take 1 tablet (20 mg total) by mouth daily. 90 tablet 1   azelastine (ASTELIN) 0.1 % nasal spray Place 1 spray into both nostrils 2 (two) times daily. Use in each nostril as directed 90 mL 3   budesonide-formoterol (SYMBICORT) 160-4.5 MCG/ACT  inhaler Inhale 2 puffs into the lungs 2 (two) times daily. 3 each 3   diclofenac Sodium (VOLTAREN) 1 % GEL Apply 1 application topically as needed for pain.     fluticasone (FLONASE) 50 MCG/ACT nasal spray Place 1 spray into both nostrils 2 (two) times daily. 48 g 3   HYDROcodone-acetaminophen (NORCO/VICODIN) 5-325 MG tablet Take 1 tablet by mouth as needed for pain.     ibuprofen (ADVIL) 200 MG tablet Take 200 mg by mouth every 6 (six) hours as needed for pain.     metolazone (ZAROXOLYN) 2.5 MG tablet TAKE 1 TABLET BY MOUTH EVERY DAY 90 tablet 2   montelukast (SINGULAIR) 10 MG tablet Take 1 tablet (  10 mg total) by mouth at bedtime. 90 tablet 3   mupirocin ointment (BACTROBAN) 2 % Apply 1 application topically 2 (two) times daily.     naloxone (NARCAN) nasal spray 4 mg/0.1 mL Place 1 spray into the nose as needed for opioid reversal.     nitroGLYCERIN (NITROSTAT) 0.4 MG SL tablet Place 1 tablet (0.4 mg total) under the tongue every 5 (five) minutes as needed for chest pain. 25 tablet 7   omeprazole (PRILOSEC) 40 MG capsule Take 40 mg by mouth daily.     potassium chloride SA (KLOR-CON) 20 MEQ tablet Take 2 tablets (40 mEq total) by mouth daily. 180 tablet 1   PROAIR HFA 108 (90 Base) MCG/ACT inhaler Inhale 2 puffs into the lungs every 4 (four) hours as needed for wheezing or shortness of breath. 18 g 3   Pseudoephedrine-Guaifenesin [phone] MG TB12 Take 1 tablet by mouth in the morning and at bedtime. 30 tablet 1   tamsulosin (FLOMAX) 0.4 MG CAPS capsule Take 0.4 mg by mouth daily.     tiotropium (SPIRIVA) 18 MCG inhalation capsule Place 1 capsule (18 mcg total) into inhaler and inhale daily. 90 capsule 3   torsemide (DEMADEX) 10 MG tablet Take 5-10 mg by mouth as needed for edema.     No current facility-administered medications for this visit.    ROS:   General:  No weight loss, Fever, chills  HEENT: No recent headaches, no nasal bleeding, no visual changes, no sore throat  Neurologic:  No dizziness, blackouts, seizures. No recent symptoms of stroke or mini- stroke. No recent episodes of slurred speech, or temporary blindness.  Cardiac: No recent episodes of chest pain/pressure, no shortness of breath at rest.  +shortness of breath with exertion.  Denies history of atrial fibrillation or irregular heartbeat  Vascular: No history of rest pain in feet.  No history of claudication.  No history of non-healing ulcer, No history of DVT   Pulmonary: + home oxygen, no productive cough, no hemoptysis,  No asthma or wheezing  Musculoskeletal:  '[X]'$  Arthritis, '[ ]'$  Low back pain,  '[X]'$  Joint pain  Hematologic:No history of hypercoagulable state.  No history of easy bleeding.  No history of anemia  Gastrointestinal: No hematochezia or melena,  No gastroesophageal reflux, no trouble swallowing  Urinary: '[ ]'$  chronic Kidney disease, '[ ]'$  on HD - '[ ]'$  MWF or '[ ]'$  TTHS, '[ ]'$  Burning with urination, '[ ]'$  Frequent urination, '[ ]'$  Difficulty urinating;   Skin: No rashes  Psychological: No history of anxiety,  No history of depression   Physical Examination  Vitals:   01/09/21 1109  BP: 101/68  Pulse: 98  Resp: 20  Temp: 98.5 F (36.9 C)  SpO2: 92%  Weight: (!) 314 lb (142.4 kg)  Height: 6' (1.829 m)    Body mass index is 42.59 kg/m.  General:  Alert and oriented, no acute distress HEENT: Normal Neck: No JVD Pulmonary: Patient becomes short of breath with conversation.   Cardiac: Regular Rate and Rhythm  Abdomen: Soft, non-tender, non-distended, obese Skin: No rash, plantar aspect of both feet becomes dusky on elevation of the foot.  This resolves fairly rapidly with rubbing the skin. Extremity Pulses:  2+ radial, brachial, femoral, dorsalis pedis pulses bilaterally Musculoskeletal: No deformity or edema  Neurologic: Upper and lower extremity motor 5/5 and symmetric  DATA:  Patient had bilateral ABIs performed today which I reviewed and interpreted.  They were greater than 1  triphasic and normal bilaterally.  Right side toe pressure was 101 left side 105  ASSESSMENT: No evidence of arterial occlusive disease.  Most likely the duskiness in the patient's foot is secondary to some element of autonomic dysfunction or overall pulmonary hypertension hypoxia from his underlying COPD.   PLAN: Patient will follow up on an as-needed basis.   Ruta Hinds, MD [facility] Office: [phone]

## 2021-02-19 ENCOUNTER — Other Ambulatory Visit: Payer: Self-pay

## 2021-02-19 ENCOUNTER — Encounter: Payer: Self-pay | Admitting: Pulmonary Disease

## 2021-02-19 ENCOUNTER — Ambulatory Visit (INDEPENDENT_AMBULATORY_CARE_PROVIDER_SITE_OTHER): Payer: Medicaid Other | Admitting: Pulmonary Disease

## 2021-02-19 VITALS — BP 132/78 | HR 70 | Temp 98.0°F | Ht 72.0 in | Wt 315.0 lb

## 2021-02-19 DIAGNOSIS — G4733 Obstructive sleep apnea (adult) (pediatric): Secondary | ICD-10-CM | POA: Diagnosis not present

## 2021-02-19 DIAGNOSIS — Z85038 Personal history of other malignant neoplasm of large intestine: Secondary | ICD-10-CM | POA: Diagnosis not present

## 2021-02-19 DIAGNOSIS — J449 Chronic obstructive pulmonary disease, unspecified: Secondary | ICD-10-CM

## 2021-02-19 DIAGNOSIS — J9611 Chronic respiratory failure with hypoxia: Secondary | ICD-10-CM | POA: Diagnosis not present

## 2021-02-19 MED ORDER — AZELASTINE HCL 0.1 % NA SOLN: 1.0000 | Freq: Two times a day (BID) | NASAL | 3 refills | Status: DC

## 2021-02-19 MED ORDER — PROAIR HFA 108 (90 BASE) MCG/ACT IN AERS: 2.0000 | INHALATION_SPRAY | RESPIRATORY_TRACT | 3 refills | Status: DC | PRN

## 2021-02-19 MED ORDER — TIOTROPIUM BROMIDE MONOHYDRATE 18 MCG IN CAPS: 1.0000 | ORAL_CAPSULE | Freq: Every day | RESPIRATORY_TRACT | 3 refills | Status: DC

## 2021-02-19 MED ORDER — BUDESONIDE-FORMOTEROL FUMARATE 160-4.5 MCG/ACT IN AERO: 2.0000 | INHALATION_SPRAY | Freq: Two times a day (BID) | RESPIRATORY_TRACT | 3 refills | Status: DC

## 2021-02-19 MED ORDER — MONTELUKAST SODIUM 10 MG PO TABS: 10.0000 mg | ORAL_TABLET | Freq: Every day | ORAL | 3 refills | Status: DC

## 2021-02-19 MED ORDER — FLUTICASONE PROPIONATE 50 MCG/ACT NA SUSP: 1.0000 | Freq: Two times a day (BID) | NASAL | 3 refills | Status: DC

## 2021-02-19 MED ORDER — ALBUTEROL SULFATE 0.63 MG/3ML IN NEBU: 1.0000 | INHALATION_SOLUTION | Freq: Four times a day (QID) | RESPIRATORY_TRACT | 3 refills | Status: DC | PRN

## 2021-02-19 NOTE — Progress Notes (Addendum)
[facility]  Chief Complaint  Patient presents with   Follow-up    COPD, OSA     Constitutional:  BP 132/78 (BP Location: Right Arm)   Pulse 70   Temp 98 F (36.7 C)   Ht 6' (1.829 m)   Wt (!) 315 lb (142.9 kg)   SpO2 96%   BMI 42.72 kg/m   Past Medical History:  Solitary kidney, Chronic pain, GERD, Genital warts, Plantar fasciitis, Thyroid nodule, Pancreatic cancer, Colon cancer, Bladder cancer  Past Surgical History:  He  has a past surgical history that includes Knee arthroscopy (Right, 2002); Colonoscopy (last one 2019); Knee arthroscopy with medial menisectomy (Left, 01/06/2018); and LEFT HEART CATH AND CORONARY ANGIOGRAPHY (N/A, 11/21/2018).  Brief Summary:  Taylor Vargas is a 60 y.o. male former smoker with COPD, obstructive sleep apnea, and chronic hypoxic respiratory failure.       Subjective:   He had cystoscopy and was told he had cancer removed.  He was seen by GI at [facility].  His physician retired.  He needs f/u colonoscopy, but would like to transfer care.  He has cough with chest congestion in the morning.  Uses 3 liters oxygen.  Knee pain also limits his activity.  He is concerned about risk of COVID exposure if he goes to exercise.  He is using home vent at night.  This helps, but feels pressure is too high and dries him out.  Physical Exam:   Appearance - well kempt, wearing oxygen, uses a walker  ENMT - no sinus tenderness, no oral exudate, no LAN, Mallampati 3 airway, no stridor  Respiratory - equal breath sounds bilaterally, no wheezing or rales  CV - s1s2 regular rate and rhythm, no murmurs  Ext - no clubbing, no edema  Skin - no rashes  Psych - normal mood and affect    Pulmonary testing:  PFT 12/22/19 >> FEV1 0.57 (19%), FEV1% 88, TLC 5.55 (76%), DLCO 32%  Chest Imaging:  CT chest 11/01/19 >> moderate centrilobular and paraseptal emphysema, subcarinal LN 1.2 cm, Rt juxta esophageal LN 1.1 cm CT  chest 03/14/20 >> moderate centrilobular emphysema, 3 mm nodule RUL new, 3 mm nodule RUL new, stable LAN CT chest 09/19/20 >> Rt upper nodule resolved  Sleep Tests:  PSG 04/06/19 >> AHI 8.9, SpO2 low 71%, CPAP 16 cm H2O CPAP 04/06/20 to 07/04/20 >> used on 90 of 90 nights with average 13 hrs 8 min.  Average AHI 1.2 with CPAP 16 cm H2O  Cardiac Tests:  LHC 11/21/18 >> non obstructive CAD Echo 11/25/18 >> EF 60 to 65%  Social History:  He  reports that he quit smoking about 4 years ago. His smoking use included cigarettes. He has a 62.00 pack-year smoking history. He has never used smokeless tobacco. He reports that he does not currently use alcohol. He reports that he does not use drugs.  Family History:  His family history includes Diabetes in his mother.     Assessment/Plan:   COPD with emphysema and chronic bronchitis. - continue spiriva, symbicort, singulair - prn albuterol  Obstructive sleep apnea with chronic respiratory failure.. - he uses [facility] for his DME - he is compliant with therapy and reports benefit - will get copy of his NIMV download and then adjust settings to help with mouth dryness and air leak - he uses 2 liters oxygen at rest and 4 liters with exertion  Chronic rhinitis. - continue fluticasone, azelastine,  and montelukast   Chronic diastolic CHF, Coronary calcification. - followed by Dr. Jyl Heinz with [facility]  Recurrent bladder cancer. - followed by Dr. Nila Nephew with urology  History of colon cancer. - will put in referral to [facility] - explained he would be high risk for colonoscopy and would need to be done in hospital setting  Time Spent Involved in Patient Care on Day of Examination:  34 minutes  Follow up:   Patient Instructions  Will arrange for referral to [facility]  Will get copy of your home vent report and call you with results  Follow up in 6 months  Medication List:    Allergies as of 02/19/2021   No Known Allergies      Medication List        Accurate as of February 19, 2021 10:58 AM. If you have any questions, ask your nurse or doctor.          albuterol 0.63 MG/3ML nebulizer solution Commonly known as: ACCUNEB Take 3 mLs (0.63 mg total) by nebulization every 6 (six) hours as needed for wheezing or shortness of breath.   ProAir HFA 108 (90 Base) MCG/ACT inhaler Generic drug: albuterol Inhale 2 puffs into the lungs every 4 (four) hours as needed for wheezing or shortness of breath.   allopurinol 100 MG tablet Commonly known as: ZYLOPRIM Take 100 mg by mouth daily.   ALPRAZolam 0.25 MG tablet Commonly known as: XANAX Take 0.25 mg by mouth 3 (three) times daily as needed for anxiety.   aspirin EC 81 MG tablet Take 1 tablet (81 mg total) by mouth daily. Swallow whole.   atorvastatin 20 MG tablet Commonly known as: LIPITOR Take 1 tablet (20 mg total) by mouth daily.   azelastine 0.1 % nasal spray Commonly known as: ASTELIN Place 1 spray into both nostrils 2 (two) times daily. Use in each nostril as directed   budesonide-formoterol 160-4.5 MCG/ACT inhaler Commonly known as: SYMBICORT Inhale 2 puffs into the lungs 2 (two) times daily.   diclofenac Sodium 1 % Gel Commonly known as: VOLTAREN Apply 1 application topically as needed for pain.   fluticasone 50 MCG/ACT nasal spray Commonly known as: FLONASE Place 1 spray into both nostrils 2 (two) times daily.   HYDROcodone-acetaminophen 5-325 MG tablet Commonly known as: NORCO/VICODIN Take 1 tablet by mouth as needed for pain.   ibuprofen 200 MG tablet Commonly known as: ADVIL Take 200 mg by mouth every 6 (six) hours as needed for pain.   metolazone 2.5 MG tablet Commonly known as: ZAROXOLYN TAKE 1 TABLET BY MOUTH EVERY DAY   montelukast 10 MG tablet Commonly known as: SINGULAIR Take 1 tablet (10 mg total) by mouth at bedtime.   mupirocin ointment 2 % Commonly known  as: BACTROBAN Apply 1 application topically 2 (two) times daily.   naloxone 4 MG/0.1ML Liqd nasal spray kit Commonly known as: NARCAN Place 1 spray into the nose as needed for opioid reversal.   nitroGLYCERIN 0.4 MG SL tablet Commonly known as: NITROSTAT Place 1 tablet (0.4 mg total) under the tongue every 5 (five) minutes as needed for chest pain.   omeprazole 40 MG capsule Commonly known as: PRILOSEC Take 40 mg by mouth daily.   potassium chloride SA 20 MEQ tablet Commonly known as: KLOR-CON Take 2 tablets (40 mEq total) by mouth daily.   Pseudoephedrine-Guaifenesin [id number] MG Tb12 Take 1 tablet by mouth in the morning and at bedtime.   tamsulosin 0.4 MG Caps capsule  Commonly known as: FLOMAX Take 0.4 mg by mouth daily.   tiotropium 18 MCG inhalation capsule Commonly known as: SPIRIVA Place 1 capsule (18 mcg total) into inhaler and inhale daily.   torsemide 10 MG tablet Commonly known as: DEMADEX Take 5-10 mg by mouth as needed for edema.        Signature:  [name], MD [place] Pager - [phone] 02/19/2021, 10:58 AM

## 2021-02-19 NOTE — Patient Instructions (Signed)
Will arrange for referral to [facility]  Will get copy of your home vent report and call you with results  Follow up in 6 months

## 2021-02-25 ENCOUNTER — Telehealth: Payer: Self-pay | Admitting: Pulmonary Disease

## 2021-02-25 DIAGNOSIS — J9611 Chronic respiratory failure with hypoxia: Secondary | ICD-10-CM

## 2021-02-25 NOTE — Telephone Encounter (Signed)
Astral 11/12/20 to 12/16/20 >> used on 35 of 35 nights with average 14 hrs 46 min.  Average AHI 0.6 with median pressure 18/10 and 95 th percentile pressure 29/15 cm H2O.  Median Vt 510 and 95 th percentile Vt 744 ml.    IVAPS auto EPAP, max PS 20cm H2O, min PS 6 cm H2O, max EPAP 16 cm H2O, min EPAP 5 cm H2O, Target Ve 5.2 L/min.  Will change to max PS 15, min PS 6, max EPAP 12, min EPAP 5 cm H2O.   Please let him know that I have review his home vent download and sent order to his DME to adjust pressure settings.  He should call back if he still has trouble with mouth dryness.

## 2021-02-28 NOTE — Telephone Encounter (Signed)
Called and spoke with patient and he stated that they came and changed the settings on his machine this morning. Advised patient to call the office back if he still has mouth dryness. He expressed understanding. Nothing further needed at this time.

## 2021-04-08 ENCOUNTER — Other Ambulatory Visit: Payer: Self-pay | Admitting: Cardiology

## 2021-06-11 ENCOUNTER — Ambulatory Visit: Payer: Medicaid Other | Admitting: Cardiology

## 2021-06-21 IMAGING — CT CT CHEST W/ CM
2 of 4 series · 15 of 36 positions shown, 18 images · IV contrast (iopamidol)
Comparison: 11/01/2019 from [facility] S[facility]

CLINICAL DATA: Follow-up of mediastinal adenopathy. History of
pneumonia/cough and chest pain since [DATE]. Pancreatic cancer with
treatments outside of the [place]. Colon cancer with
resection. Bladder cancer with resection.

EXAM:
CT CHEST WITH CONTRAST
TECHNIQUE: Multidetector CT imaging of the chest was performed during
intravenous contrast administration.
CONTRAST:  75mL [id number] IOPAMIDOL ([id number]) INJECTION 61%

[Series 2: chest 2.00 br40 s3 · axial · 0.84mm/px · z∈[+1532,+1836]mm · 12 of 180 slices shown, 15 images (1 of 2)]
[im 14/180  mediastinal]
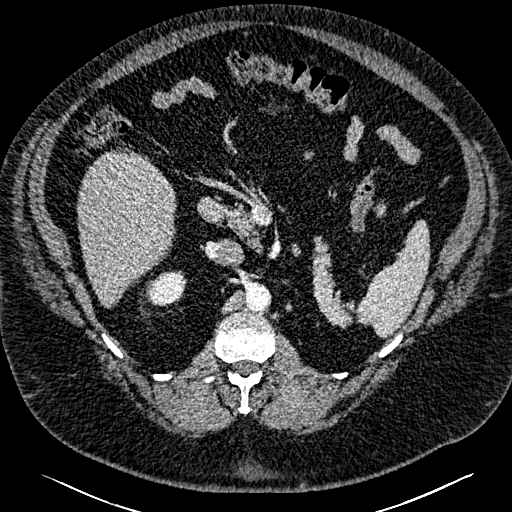
[im 14/180  lung]
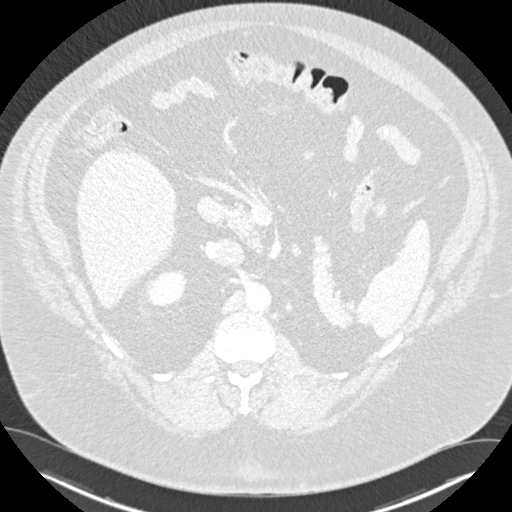
[im 28/180  lung]
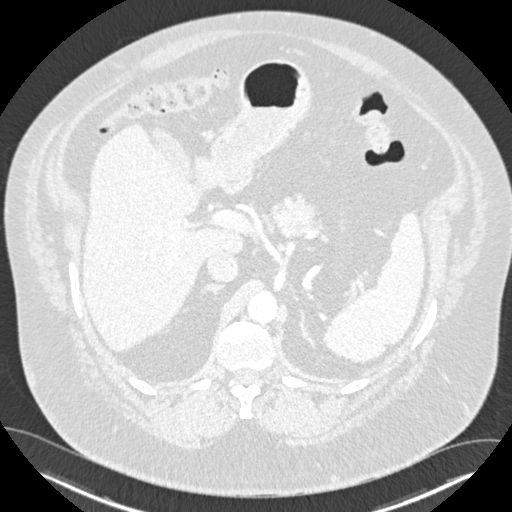
[im 42/180  lung]
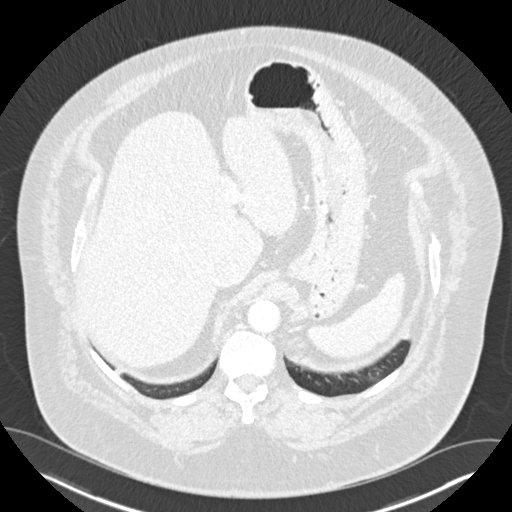
[im 56/180  lung]
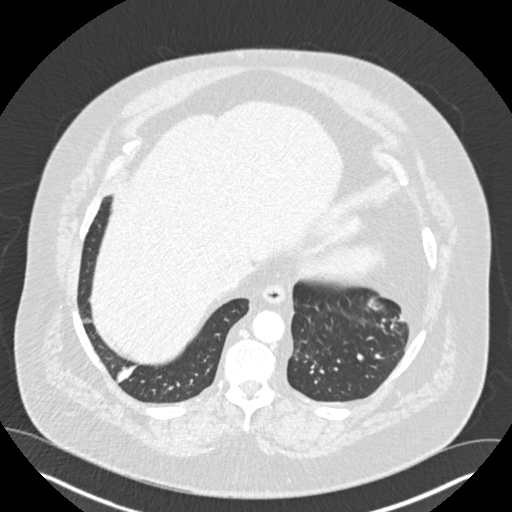
[im 69/180  mediastinal]
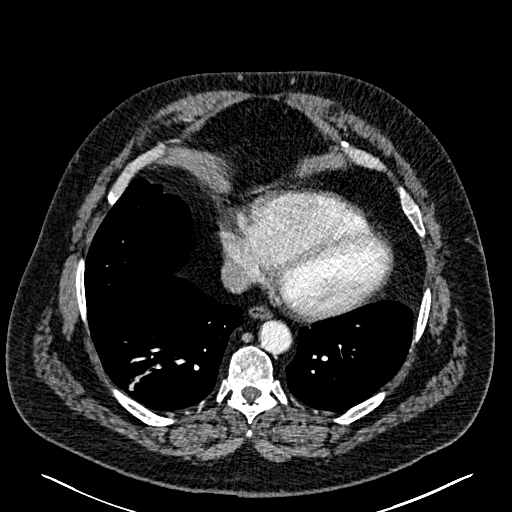
[im 69/180  lung]
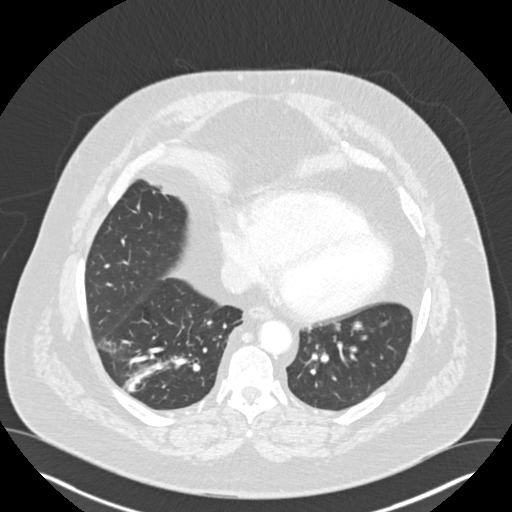
[im 83/180  lung]
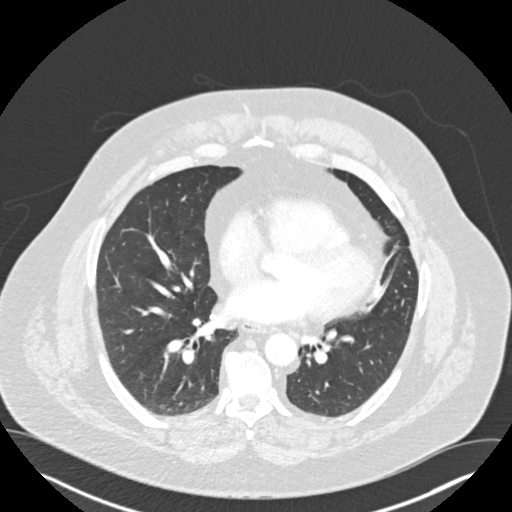
[im 97/180  lung]
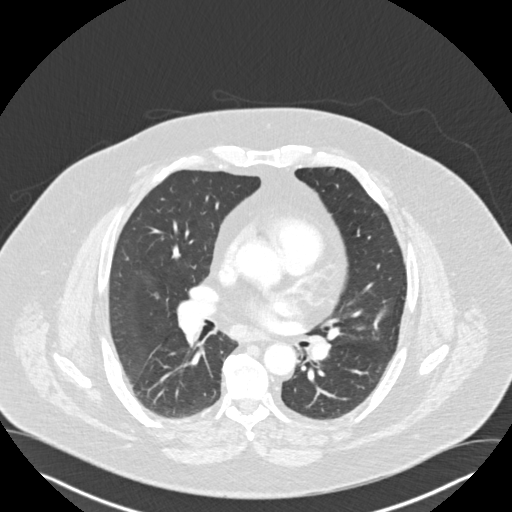
[im 111/180  lung]
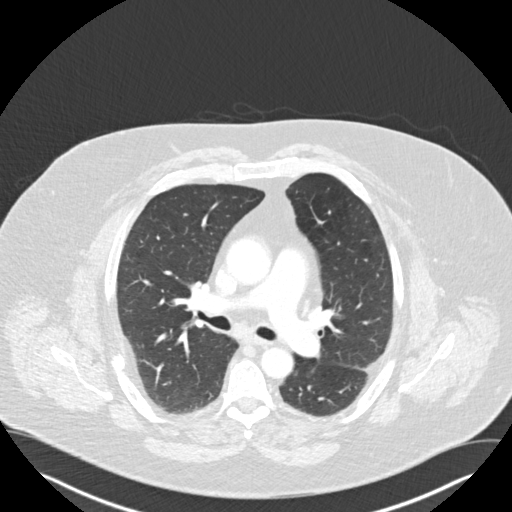
[im 124/180  mediastinal]
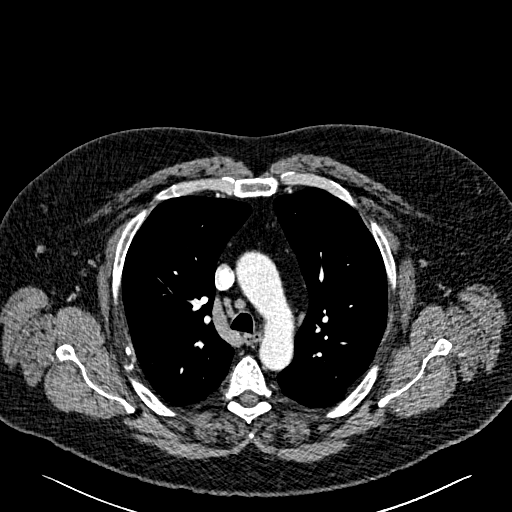
[im 124/180  lung]
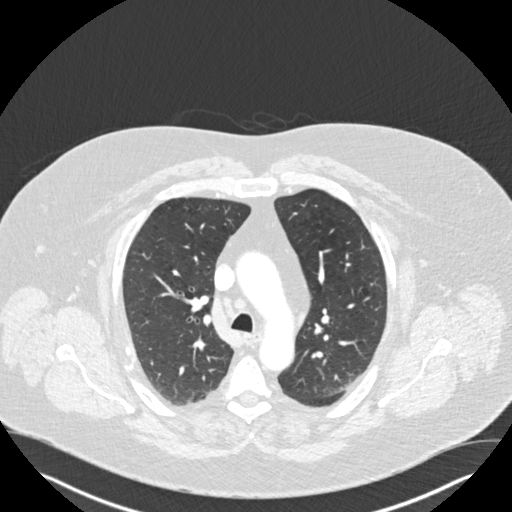
[im 138/180  lung]
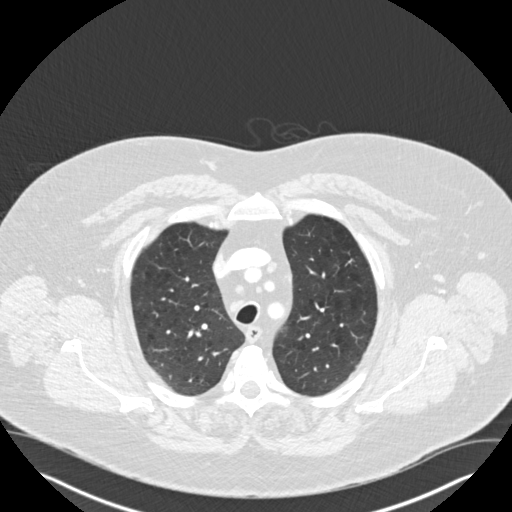
[im 152/180  lung]
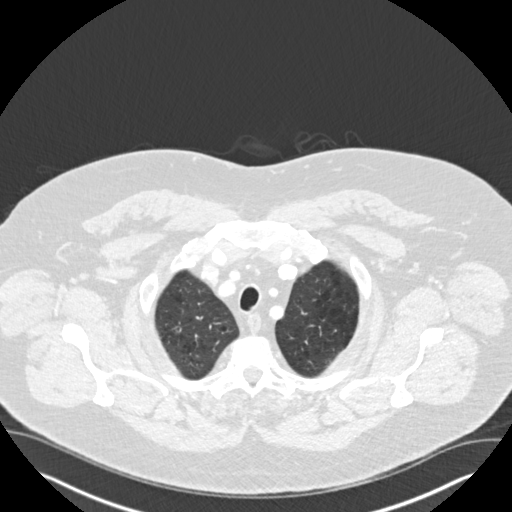
[im 166/180  lung]
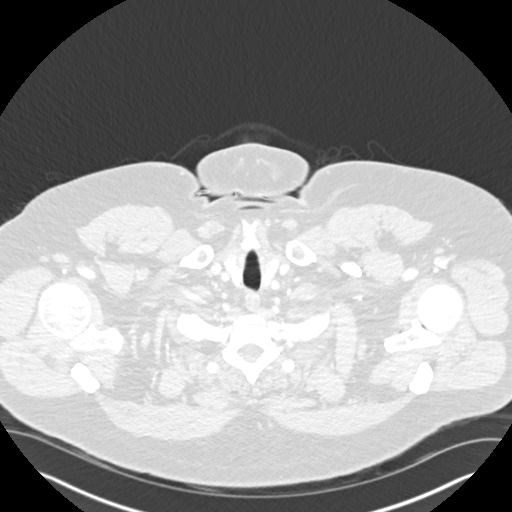

[Series 4: chest 2.00 br40 s3 · coronal · 0.70mm/px · 3 of 214 slices shown (2 of 2)]
[im 43/214  lung]
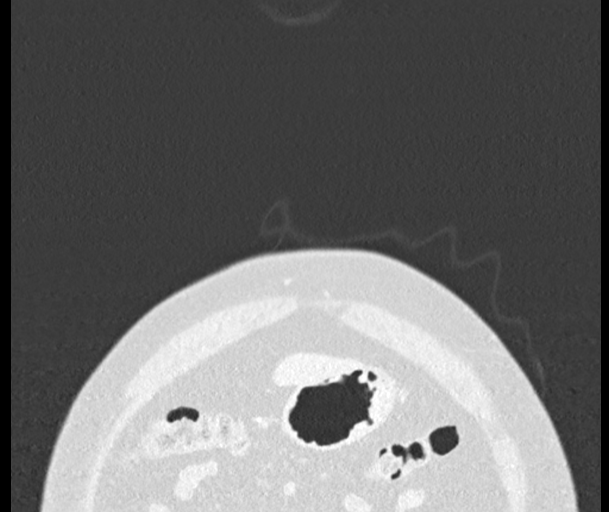
[im 86/214  lung]
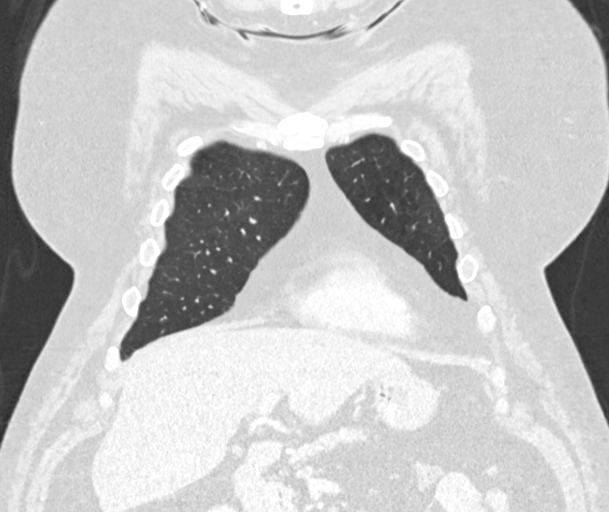
[im 128/214  lung]
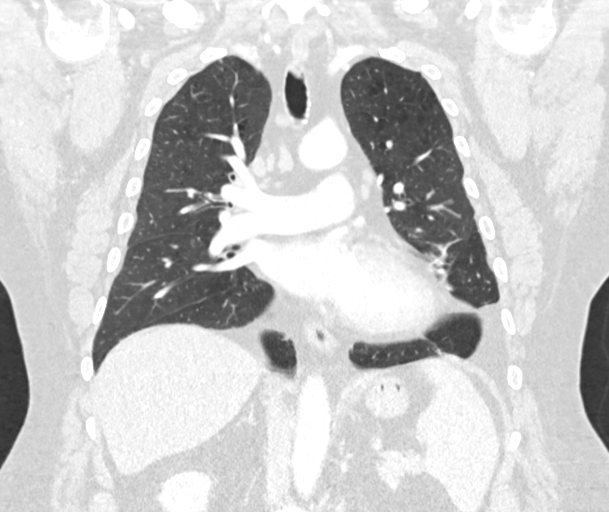

[15 of 36 positions shown; findings below may reference images not displayed]

FINDINGS: Cardiovascular: Aortic atherosclerosis. Tortuous thoracic aorta.
Mild cardiomegaly, without pericardial effusion. No central
pulmonary embolism, on this non-dedicated study.

Mediastinum/Nodes: No supraclavicular adenopathy. 8 mm prevascular
node is similar on 60/2.

Subcarinal index node measures 1.2 cm on 74/2 and is unchanged. An
adjacent node within the azygoesophageal recess measures 1.0 cm on
82/2 and is also similar. No hilar adenopathy.

Lungs/Pleura: No pleural fluid.  Moderate centrilobular emphysema.

Right greater than left base scarring.

Right upper lobe 3 mm pulmonary nodule on [DATE], new.

More inferior right upper lobe 2-3 mm pulmonary nodule on 47/8, new.

Upper Abdomen: Nonspecific caudate lobe enlargement. Normal imaged
portions of the gallbladder, spleen, stomach, pancreas, adrenal
glands, right kidney.

Musculoskeletal: Remote right clavicular and bilateral rib
fractures.
IMPRESSION: 1. Four months stability of borderline mediastinal adenopathy.
Indeterminate but favored to be benign/reactive.
2. New tiny right upper lobe pulmonary nodules. Given history of
multiple primary malignancies, consider chest CT follow-up at 6
months to confirm stability or resolution.
3. Aortic atherosclerosis (AINQW-[id number].N) and emphysema ([id number].L).

## 2021-06-23 ENCOUNTER — Other Ambulatory Visit: Payer: Self-pay | Admitting: Cardiology

## 2021-07-07 ENCOUNTER — Other Ambulatory Visit: Payer: Self-pay | Admitting: Cardiology

## 2021-07-10 ENCOUNTER — Other Ambulatory Visit: Payer: Self-pay | Admitting: Cardiology

## 2021-08-22 ENCOUNTER — Other Ambulatory Visit: Payer: Self-pay

## 2021-08-22 ENCOUNTER — Encounter: Payer: Self-pay | Admitting: Pulmonary Disease

## 2021-08-22 ENCOUNTER — Ambulatory Visit (INDEPENDENT_AMBULATORY_CARE_PROVIDER_SITE_OTHER): Payer: Medicaid Other | Admitting: Pulmonary Disease

## 2021-08-22 VITALS — BP 124/76 | HR 99 | Temp 97.9°F | Ht 72.0 in | Wt 314.0 lb

## 2021-08-22 DIAGNOSIS — J9611 Chronic respiratory failure with hypoxia: Secondary | ICD-10-CM

## 2021-08-22 DIAGNOSIS — J449 Chronic obstructive pulmonary disease, unspecified: Secondary | ICD-10-CM

## 2021-08-22 DIAGNOSIS — Z9989 Dependence on other enabling machines and devices: Secondary | ICD-10-CM

## 2021-08-22 DIAGNOSIS — G4733 Obstructive sleep apnea (adult) (pediatric): Secondary | ICD-10-CM

## 2021-08-22 DIAGNOSIS — Z01811 Encounter for preprocedural respiratory examination: Secondary | ICD-10-CM

## 2021-08-22 NOTE — Progress Notes (Signed)
? ?[facility] ? ?Chief Complaint  ?Patient presents with  ? Follow-up  ?  Follow up. Patient says he is having a bad time breathing and running out breath.   ? ? ? ?Constitutional:  ?BP 124/76 (BP Location: Right Arm, Patient Position: Sitting, Cuff Size: Large)   Pulse 99   Temp 97.9 ?F (36.6 ?C) (Oral)   Ht 6' (1.829 m)   Wt (!) 314 lb (142.4 kg)   SpO2 96%   BMI 42.59 kg/m?  ? ?Past Medical History:  ?Solitary kidney, Chronic pain, GERD, Genital warts, Plantar fasciitis, Thyroid nodule, Pancreatic cancer, Colon cancer, Bladder cancer ? ?Past Surgical History:  ?He  has a past surgical history that includes Knee arthroscopy (Right, 2002); Colonoscopy (last one 2019); Knee arthroscopy with medial menisectomy (Left, 01/06/2018); and LEFT HEART CATH AND CORONARY ANGIOGRAPHY (N/A, 11/21/2018). ? ?Brief Summary:  ?Taylor Vargas is a 61 y.o. male former smoker with COPD, obstructive sleep apnea, and chronic hypoxic respiratory failure.  ?  ? ? ? ?Subjective:  ? ?Changed his Astral to max PS 15, min PS 6, max EPAP 12, min EPAP 5 cm H2O after reviewing his download in September. ? ?He had cystoscopy again with Dr. Nila Vargas in [place].  He was told that he would need more involved surgery and would either need an epidural or general anesthesia. ? ?He is using Astral at night.  Feels settings are comfortable. ? ?He can't do any activity without getting short of breath.  He has some cough and clear sputum. ? ?Using 2 to 4 liters oxygen 24/7. ? ?He father is in ICU in [place].  He won't be able to visit him. ? ?Physical Exam:  ? ?Appearance - well kempt, wearing oxygen ? ?ENMT - no sinus tenderness, no oral exudate, no LAN, Mallampati 3 airway, no stridor ? ?Respiratory - decreased breath sounds bilaterally, no wheezing or rales ? ?CV - s1s2 regular rate and rhythm, no murmurs ? ?Ext - no clubbing, no edema ? ?Skin - no rashes ? ?Psych - normal mood and affect ? ? ?  ?Pulmonary testing:   ?PFT 12/22/19 >> FEV1 0.57 (19%), FEV1% 88, TLC 5.55 (76%), DLCO 32% ? ?Chest Imaging:  ?CT chest 11/01/19 >> moderate centrilobular and paraseptal emphysema, subcarinal LN 1.2 cm, Rt juxta esophageal LN 1.1 cm ?CT chest 03/14/20 >> moderate centrilobular emphysema, 3 mm nodule RUL new, 3 mm nodule RUL new, stable LAN ?CT chest 09/19/20 >> Rt upper nodule resolved ? ?Sleep Tests:  ?PSG 04/06/19 >> AHI 8.9, SpO2 low 71%, CPAP 16 cm H2O ?CPAP 04/06/20 to 07/04/20 >> used on 90 of 90 nights with average 13 hrs 8 min.  Average AHI 1.2 with CPAP 16 cm H2O ?Astral 11/12/20 to 12/16/20 >> used on 35 of 35 nights with average 14 hrs 46 min.  Average AHI 0.6 with median pressure 18/10 and 95 th percentile pressure 29/15 cm H2O.  Median Vt 510 and 95 th percentile Vt 744 ml.   ? ?Cardiac Tests:  ?LHC 11/21/18 >> non obstructive CAD ?Echo 11/25/18 >> EF 60 to 65% ? ?Social History:  ?He  reports that he quit smoking about 5 years ago. His smoking use included cigarettes. He has a 62.00 pack-year smoking history. He has never used smokeless tobacco. He reports that he does not currently use alcohol. He reports that he does not use drugs. ? ?Family History:  ?His family history includes Diabetes in his mother. ?  ? ? ?  Assessment/Plan:  ? ?COPD with emphysema and chronic bronchitis. ?- continue spiriva, symbicort, singulair ?- prn albuterol ?- if his breathing gets worse, then might need to consider changing to maintenance nebulizer medicine and adding low dose prednisone ?- discussed goals of care; he would like to continue aggressive therapies  ? ?Obstructive sleep apnea with chronic respiratory failure.. ?- he uses [facility] for his DME ?- he is compliant with therapy and reports benefit ?- Continue astral at max PS 15, min PS 6, max EPAP 12, min EPAP 5 cm H2O  ?- he uses 2 liters oxygen at rest and 4 liters with exertion ? ?Chronic rhinitis. ?- continue fluticasone, azelastine, and montelukast ?  ?Chronic diastolic CHF,  Coronary calcification. ?- followed by Dr. Jyl Vargas with [place] ? ?Recurrent bladder cancer. ?- followed by Dr. Joie Vargas with urology in [place] ?- he needs to have more advance procedure due to recurrence of cancer; as such he doesn't have much of an option to avoid surgery ?- I am concerned that he is at high risk for post-operative pulmonary complications, including but not limited to prolonged need for ventilatory support ?- I expressed my concern that [facility] would not have sufficient resources to manage his respiratory status after surgery and recommend he have procedure done in either [place] or [facility] ?- he will d/w his urologist in [employer] ? ?History of colon cancer. ?- will put in referral to [facility] ?- explained he would be high risk for colonoscopy and would need to be done in hospital setting ? ?Time Spent Involved in Patient Care on Day of Examination:  ?39 minutes ? ?Follow up:  ? ?Patient Instructions  ?Follow up in 3 months ? ?Medication List:  ? ?Allergies as of 08/22/2021   ?No Known Allergies ?  ? ?  ?Medication List  ?  ? ?  ? Accurate as of August 22, 2021 12:33 PM. If you have any questions, ask your nurse or doctor.  ?  ?  ? ?  ? ?albuterol 0.63 MG/3ML nebulizer solution ?Commonly known as: ACCUNEB ?Take 3 mLs (0.63 mg total) by nebulization every 6 (six) hours as needed for wheezing or shortness of breath. ?  ?ProAir HFA 108 (90 Base) MCG/ACT inhaler ?Generic drug: albuterol ?Inhale 2 puffs into the lungs every 4 (four) hours as needed for wheezing or shortness of breath. ?  ?allopurinol 100 MG tablet ?Commonly known as: ZYLOPRIM ?Take 100 mg by mouth daily. ?  ?ALPRAZolam 0.25 MG tablet ?Commonly known as: Duanne Moron ?Take 0.25 mg by mouth 3 (three) times daily as needed for anxiety. ?  ?aspirin EC 81 MG tablet ?Take 1 tablet (81 mg total) by mouth daily. Swallow whole. ?  ?atorvastatin 20 MG tablet ?Commonly known as: LIPITOR ?TAKE 1  TABLET(20 MG) BY MOUTH DAILY ?  ?azelastine 0.1 % nasal spray ?Commonly known as: ASTELIN ?Place 1 spray into both nostrils 2 (two) times daily. Use in each nostril as directed ?  ?budesonide-formoterol 160-4.5 MCG/ACT inhaler ?Commonly known as: SYMBICORT ?Inhale 2 puffs into the lungs 2 (two) times daily. ?  ?diclofenac Sodium 1 % Gel ?Commonly known as: VOLTAREN ?Apply 1 application topically as needed for pain. ?  ?fluticasone 50 MCG/ACT nasal spray ?Commonly known as: FLONASE ?Place 1 spray into both nostrils 2 (two) times daily. ?  ?HYDROcodone-acetaminophen 5-325 MG tablet ?Commonly known as: NORCO/VICODIN ?Take 1 tablet by mouth as needed for pain. ?  ?ibuprofen 200 MG tablet ?Commonly known  as: ADVIL ?Take 200 mg by mouth every 6 (six) hours as needed for pain. ?  ?metolazone 2.5 MG tablet ?Commonly known as: ZAROXOLYN ?TAKE 1 TABLET BY MOUTH EVERY DAY ?  ?montelukast 10 MG tablet ?Commonly known as: SINGULAIR ?Take 1 tablet (10 mg total) by mouth at bedtime. ?  ?mupirocin ointment 2 % ?Commonly known as: BACTROBAN ?Apply 1 application topically 2 (two) times daily. ?  ?naloxone 4 MG/0.1ML Liqd nasal spray kit ?Commonly known as: NARCAN ?Place 1 spray into the nose as needed for opioid reversal. ?  ?nitroGLYCERIN 0.4 MG SL tablet ?Commonly known as: NITROSTAT ?PLACE 1 TABLET UNDER THE TONGUE EVERY 5 MINUTES AS NEEDED FOR CHEST FOR PAIN ?  ?omeprazole 40 MG capsule ?Commonly known as: PRILOSEC ?Take 40 mg by mouth daily. ?  ?potassium chloride SA 20 MEQ tablet ?Commonly known as: KLOR-CON M ?TAKE 2 TABLETS(40 MEQ) BY MOUTH DAILY ?  ?Pseudoephedrine-Guaifenesin [phone] MG Tb12 ?Take 1 tablet by mouth in the morning and at bedtime. ?  ?tamsulosin 0.4 MG Caps capsule ?Commonly known as: FLOMAX ?Take 0.4 mg by mouth daily. ?  ?tiotropium 18 MCG inhalation capsule ?Commonly known as: SPIRIVA ?Place 1 capsule (18 mcg total) into inhaler and inhale daily. ?  ?torsemide 10 MG tablet ?Commonly known as:  DEMADEX ?Take 5-10 mg by mouth as needed for edema. ?  ? ?  ? ? ?Signature:  ?[name], MD ?[place] ?Pager - [phone] ?08/22/2021, 12:33 PM ?  ? ? ? ? ? ? ? ? ?

## 2021-08-22 NOTE — Patient Instructions (Signed)
Follow up in 3 months

## 2021-08-25 ENCOUNTER — Telehealth: Payer: Self-pay | Admitting: Pulmonary Disease

## 2021-08-25 NOTE — Telephone Encounter (Signed)
Fax received from Dr. Joie Bimler with [facility] to perform a Cup bladder biopsy with fulguration under general anesthesia on patient.  Patient needs surgery clearance. Patient was seen on 08/22/2021. Office protocol is a risk assessment can be sent to surgeon if patient has been seen in 60 days or less.  ? ?Sending to [place] for risk assessment or recommendations if patient needs to be seen in office prior to surgical procedure.   ?

## 2021-08-25 NOTE — Telephone Encounter (Signed)
I commented on his fitness for surgery in my note on 08/22/21 and had this faxed to Dr. Ara Kussmaul office.  You can fax my note again to Dr. Ara Kussmaul office. ?

## 2021-08-26 NOTE — Telephone Encounter (Signed)
Mandi, please advise.  ?Can you provide me with the fax number that is on the form to fax last OV note? ?

## 2021-08-26 NOTE — Telephone Encounter (Signed)
OV notes and clearance form have been faxed back to [facility]. Nothing further needed at this time. ?

## 2021-09-08 ENCOUNTER — Ambulatory Visit: Payer: Medicaid Other | Admitting: Cardiology

## 2021-09-10 ENCOUNTER — Other Ambulatory Visit: Payer: Self-pay

## 2021-09-12 ENCOUNTER — Ambulatory Visit (INDEPENDENT_AMBULATORY_CARE_PROVIDER_SITE_OTHER): Payer: Medicaid Other | Admitting: Cardiology

## 2021-09-12 ENCOUNTER — Encounter: Payer: Self-pay | Admitting: Cardiology

## 2021-09-12 VITALS — BP 100/70 | HR 124 | Ht 72.0 in | Wt 302.6 lb

## 2021-09-12 DIAGNOSIS — Z9989 Dependence on other enabling machines and devices: Secondary | ICD-10-CM

## 2021-09-12 DIAGNOSIS — J431 Panlobular emphysema: Secondary | ICD-10-CM

## 2021-09-12 DIAGNOSIS — G4733 Obstructive sleep apnea (adult) (pediatric): Secondary | ICD-10-CM

## 2021-09-12 DIAGNOSIS — I2510 Atherosclerotic heart disease of native coronary artery without angina pectoris: Secondary | ICD-10-CM | POA: Diagnosis not present

## 2021-09-12 DIAGNOSIS — E7800 Pure hypercholesterolemia, unspecified: Secondary | ICD-10-CM

## 2021-09-12 MED ORDER — ATORVASTATIN CALCIUM 20 MG PO TABS: 20.0000 mg | ORAL_TABLET | Freq: Every day | ORAL | 1 refills | Status: DC

## 2021-09-12 MED ORDER — NITROGLYCERIN 0.4 MG SL SUBL: 0.4000 mg | SUBLINGUAL_TABLET | SUBLINGUAL | 7 refills | Status: DC | PRN

## 2021-09-12 NOTE — Addendum Note (Signed)
Addended by: Jerl Santos R on: 09/12/2021 11:40 AM ? ? Modules accepted: Orders ? ?

## 2021-09-12 NOTE — Progress Notes (Signed)
?Cardiology Office Note:   ? ?Date:  09/12/2021  ? ?ID:  Taylor Vargas, DOB 07-05-1960, MRN [id number] ? ?PCP:  Street, Sharon Mt, MD  ?Cardiologist:  Jenean Lindau, MD  ? ?Referring MD: Street, Sharon Mt, *  ? ? ?ASSESSMENT:   ? ?1. Coronary artery calcification seen on CAT scan   ?2. Hypercholesterolemia   ?3. Panlobular emphysema ([place])   ?4. Morbid obesity ([place])   ?5. OSA on CPAP   ? ?PLAN:   ? ?In order of problems listed above: ? ?Coronary artery calcification: Secondary prevention stressed with patient.  Importance of compliance with diet medication stressed and he vocalized understanding. ?Essential hypertension: Blood pressure stable and diet was emphasized. ?Mixed dyslipidemia: He plans to get complete blood work done by primary care in the next few days and will send copy. ?COPD on supplemental oxygen and morbid obesity: Lifestyle modification urged weight reduction stressed diet emphasized and he promises to do better. ?Patient will be seen in follow-up appointment in 9 months or earlier if the patient has any concerns ? ? ? ?Medication Adjustments/Labs and Tests Ordered: ?Current medicines are reviewed at length with the patient today.  Concerns regarding medicines are outlined above.  ?No orders of the defined types were placed in this encounter. ? ?No orders of the defined types were placed in this encounter. ? ? ? ?Chief Complaint  ?Patient presents with  ? Follow-up  ?  ? ?History of Present Illness:   ? ?Taylor Vargas is a 61 y.o. male.  Patient has past medical history of coronary artery calcification, essential hypertension, dyslipidemia, COPD on supplemental oxygen and morbid obesity.  He denies any problems at this time and takes care of activities of daily living.  No chest pain orthopnea or PND.  At the time of my evaluation, the patient is alert awake oriented and in no distress.  He essentially is very sedentary and on oxygen and ambulates with a walker. ? ?Past Medical History:  ?Diagnosis  Date  ? Abnormal glucose 08/27/2015  ? Last Assessment & Plan:  Relevant Hx: Course: Daily Update: Today's Plan:will get an a1c especially given his increased urinary frequency  Electronically signed by: Baldemar Friday, FNP 08/27/15 1513  ? Arthritis   ? Benign prostatic hyperplasia with lower urinary tract symptoms 08/27/2015  ? Last Assessment & Plan:  Relevant Hx: Course: Daily Update: Today's Plan:taking flomax for this, but his was presumed, no workup for this diagnosis  Electronically signed by: Baldemar Friday, [place] 08/27/15 1513  ? Chest tightness 11/08/2018  ? Chronic diastolic heart failure ([place]) 04/21/2019  ? Chronic obstructive pulmonary disease ([place]) 08/27/2015  ? Last Assessment & Plan:  Relevant Hx: Course: Daily Update: Today's Plan:this is likely in acute exacerbation. I have discussed with him that I would like to et cxr to rule out acute pathology. He finally agrees to this. His sat came up to 89% on RA He understands s/e's of the prednisone and if he does have an element of diabetes or prediabtes this could make that worse for him  Electronically sig  ? Chronic pain of both knees 01/04/2020  ? Formatting of this note might be different from the original. Added automatically from request for surgery [phone]  ? Congenital absence of one kidney   ? 01-04-2018 per pt unsure which one  ? COPD (chronic obstructive pulmonary disease) ([place])   ? pulmologist-  dr Alcide Clever [name])  ? Coronary artery calcification seen on CAT scan 11/30/2019  ?  GERD (gastroesophageal reflux disease)   ? Hypercholesterolemia 09/10/2015  ? Hyperglycemia 09/10/2015  ? Malaise and fatigue 08/27/2015  ? Last Assessment & Plan:  Relevant Hx: Course: Daily Update: Today's Plan:will get labs to rule out acute pathology   Electronically signed by: Baldemar Friday, [place] 08/27/15 1513  ? Morbid obesity ([place]) 11/02/2018  ? On supplemental oxygen therapy   ? 01-04-2018  per pt prescribed as needed ,  states checks oxygen  level daily,  and average between 88-90% on room air,  per pt has not needed O2 past 4 months  ? OSA on CPAP   ? Osteoarthritis of right knee 01/11/2020  ? Pain in left knee 12/28/2017  ? Pancreatic cancer ([place])   ? 01-04-2018  per pt dx 10/ 2009 found by CT imaging, no surgery,  treatment chemotherapy  @ [facility],  in remission since 2017  ? Solitary thyroid nodule 09/10/2015  ? ? ?Past Surgical History:  ?Procedure Laterality Date  ? COLONOSCOPY  last one 2019  ? KNEE ARTHROSCOPY Right 2002  ? KNEE ARTHROSCOPY WITH MEDIAL MENISECTOMY Left 01/06/2018  ? Procedure: LEFT KNEE ARTHROSCOPY WITH MEDIAL MENISECTOMY;  Surgeon: Rod Can, MD;  Location: [facility];  Service: Orthopedics;  Laterality: Left;  ? LEFT HEART CATH AND CORONARY ANGIOGRAPHY N/A 11/21/2018  ? Procedure: LEFT HEART CATH AND CORONARY ANGIOGRAPHY;  Surgeon: Nelva Bush, MD;  Location: [facility];  Service: Cardiovascular;  Laterality: N/A;  ? ? ?Current Medications: ?Current Meds  ?Medication Sig  ? albuterol (ACCUNEB) 0.63 MG/3ML nebulizer solution Take 3 mLs (0.63 mg total) by nebulization every 6 (six) hours as needed for wheezing or shortness of breath.  ? allopurinol (ZYLOPRIM) 100 MG tablet Take 100 mg by mouth daily.  ? ALPRAZolam (XANAX) 0.25 MG tablet Take 0.25 mg by mouth 3 (three) times daily as needed for anxiety.  ? aspirin EC 81 MG tablet Take 1 tablet (81 mg total) by mouth daily. Swallow whole.  ? atorvastatin (LIPITOR) 20 MG tablet TAKE 1 TABLET(20 MG) BY MOUTH DAILY (Patient taking differently: Take 20 mg by mouth daily.)  ? azelastine (ASTELIN) 0.1 % nasal spray Place 1 spray into both nostrils 2 (two) times daily. Use in each nostril as directed  ? budesonide-formoterol (SYMBICORT) 160-4.5 MCG/ACT inhaler Inhale 2 puffs into the lungs 2 (two) times daily.  ? diclofenac Sodium (VOLTAREN) 1 % GEL Apply 1 application topically as needed for pain.  ? fluticasone (FLONASE) 50 MCG/ACT nasal spray Place 1 spray  into both nostrils 2 (two) times daily.  ? HYDROcodone-acetaminophen (NORCO/VICODIN) 5-325 MG tablet Take 1 tablet by mouth as needed for pain.  ? ibuprofen (ADVIL) 200 MG tablet Take 200 mg by mouth every 6 (six) hours as needed for pain.  ? metolazone (ZAROXOLYN) 2.5 MG tablet TAKE 1 TABLET BY MOUTH EVERY DAY (Patient taking differently: Take 2.5 mg by mouth daily.)  ? montelukast (SINGULAIR) 10 MG tablet Take 1 tablet (10 mg total) by mouth at bedtime.  ? mupirocin ointment (BACTROBAN) 2 % Apply 1 application topically 2 (two) times daily.  ? naloxone (NARCAN) nasal spray 4 mg/0.1 mL Place 1 spray into the nose as needed for opioid reversal.  ? nitroGLYCERIN (NITROSTAT) 0.4 MG SL tablet PLACE 1 TABLET UNDER THE TONGUE EVERY 5 MINUTES AS NEEDED FOR CHEST FOR PAIN (Patient taking differently: Place 0.4 mg under the tongue every 5 (five) minutes as needed for chest pain.)  ? NYSTATIN powder Apply 1 application. topically 2 (  two) times daily.  ? omeprazole (PRILOSEC) 20 MG capsule Take 20 mg by mouth 2 (two) times daily.  ? pentoxifylline (TRENTAL) 400 MG CR tablet Take 400 mg by mouth 2 (two) times daily.  ? potassium chloride SA (KLOR-CON M) 20 MEQ tablet TAKE 2 TABLETS(40 MEQ) BY MOUTH DAILY (Patient taking differently: Take 40 mEq by mouth daily.)  ? PROAIR HFA 108 (90 Base) MCG/ACT inhaler Inhale 2 puffs into the lungs every 4 (four) hours as needed for wheezing or shortness of breath.  ? Pseudoephedrine-Guaifenesin [phone] MG TB12 Take 1 tablet by mouth in the morning and at bedtime.  ? tamsulosin (FLOMAX) 0.4 MG CAPS capsule Take 0.4 mg by mouth daily.  ? tiotropium (SPIRIVA) 18 MCG inhalation capsule Place 1 capsule (18 mcg total) into inhaler and inhale daily.  ? torsemide (DEMADEX) 10 MG tablet Take 5-10 mg by mouth as needed for edema.  ?  ? ?Allergies:   Patient has no known allergies.  ? ?Social History  ? ?Socioeconomic History  ? Marital status: Single  ?  Spouse name: Not on file  ? Number of  children: Not on file  ? Years of education: Not on file  ? Highest education level: Not on file  ?Occupational History  ? Not on file  ?Tobacco Use  ? Smoking status: Former  ?  Packs/day: 2.00  ?  Years: 31.00

## 2021-09-12 NOTE — Patient Instructions (Signed)
Medication Instructions:  ?Your physician recommends that you continue on your current medications as directed. Please refer to the Current Medication list given to you today. ? ?*If you need a refill on your cardiac medications before your next appointment, please call your pharmacy* ? ? ?Lab Work: ?NONE ?If you have labs (blood work) drawn today and your tests are completely normal, you will receive your results only by: ?MyChart Message (if you have MyChart) OR ?A paper copy in the mail ?If you have any lab test that is abnormal or we need to change your treatment, we will call you to review the results. ? ? ?Testing/Procedures: ?NONE ? ? ?Follow-Up: ?At [facility], you and your health needs are our priority.  As part of our continuing mission to provide you with exceptional heart care, we have created designated Provider Care Teams.  These Care Teams include your primary Cardiologist (physician) and Advanced Practice Providers (APPs -  Physician Assistants and Nurse Practitioners) who all work together to provide you with the care you need, when you need it. ? ?We recommend signing up for the patient portal called "MyChart".  Sign up information is provided on this After Visit Summary.  MyChart is used to connect with patients for Virtual Visits (Telemedicine).  Patients are able to view lab/test results, encounter notes, upcoming appointments, etc.  Non-urgent messages can be sent to your provider as well.   ?To learn more about what you can do with MyChart, go to [email or web address].   ? ?Your next appointment:   ?9 month(s) ? ?The format for your next appointment:   ?In Person ? ?Provider:   ?Jyl Heinz, MD  ? ? ?Other Instructions ?  ?

## 2021-10-02 ENCOUNTER — Other Ambulatory Visit: Payer: Self-pay | Admitting: Cardiology

## 2021-10-06 LAB — LAB REPORT - SCANNED: EGFR: 60

## 2022-03-19 ENCOUNTER — Other Ambulatory Visit: Payer: Self-pay | Admitting: *Deleted

## 2022-03-19 MED ORDER — FLUTICASONE PROPIONATE 50 MCG/ACT NA SUSP: 1.0000 | Freq: Two times a day (BID) | NASAL | 3 refills | Status: DC

## 2022-03-21 ENCOUNTER — Other Ambulatory Visit: Payer: Self-pay | Admitting: Cardiology

## 2022-04-02 ENCOUNTER — Other Ambulatory Visit: Payer: Self-pay | Admitting: *Deleted

## 2022-04-02 MED ORDER — TIOTROPIUM BROMIDE MONOHYDRATE 18 MCG IN CAPS: 1.0000 | ORAL_CAPSULE | Freq: Every day | RESPIRATORY_TRACT | 3 refills | Status: DC

## 2022-04-08 ENCOUNTER — Telehealth: Payer: Self-pay | Admitting: *Deleted

## 2022-04-08 NOTE — Telephone Encounter (Signed)
Patient called to check with schedule for Dr. Halford Chessman. Would like an appointment with him ASAP due to running low on his medications. Offered an appointment opening today, but patient is unable to come.  Offered to look for H. J. Heinz NP but he prefers to see Dr Halford Chessman.   Nothing needed at this time, but please call when Dr Juanetta Gosling schedule is in for [facility]

## 2022-04-14 NOTE — Progress Notes (Unsigned)
Synopsis: [name] is a 61 y.o. male former smoker with COPD, obstructive sleep apnea, and chronic hypoxic respiratory failure.  Subjective:   PATIENT ID: Taylor Vargas GENDER: male DOB: 1961/03/18, MRN: [id number]   HPI  No chief complaint on file.   ***  Past Medical History:  Diagnosis Date   Abnormal glucose 08/27/2015   Last Assessment & Plan:  Relevant Hx: Course: Daily Update: Today's Plan:will get an a1c especially given his increased urinary frequency  Electronically signed by: Baldemar Friday, FNP 08/27/15 1513   Arthritis    Benign prostatic hyperplasia with lower urinary tract symptoms 08/27/2015   Last Assessment & Plan:  Relevant Hx: Course: Daily Update: Today's Plan:taking flomax for this, but his was presumed, no workup for this diagnosis  Electronically signed by: Baldemar Friday, [place] 08/27/15 1513   Chest tightness 11/08/2018   Chronic diastolic heart failure ([place]) 04/21/2019   Chronic obstructive pulmonary disease ([place]) 08/27/2015   Last Assessment & Plan:  Relevant Hx: Course: Daily Update: Today's Plan:this is likely in acute exacerbation. I have discussed with him that I would like to et cxr to rule out acute pathology. He finally agrees to this. His sat came up to 89% on RA He understands s/e's of the prednisone and if he does have an element of diabetes or prediabtes this could make that worse for him  Electronically sig   Chronic pain of both knees 01/04/2020   Formatting of this note might be different from the original. Added automatically from request for surgery [id number]   Congenital absence of one kidney    01-04-2018 per pt unsure which one   COPD (chronic obstructive pulmonary disease) ([place])    pulmologist-  dr Alcide Clever [name])   Coronary artery calcification seen on CAT scan 11/30/2019   GERD (gastroesophageal reflux disease)    Hypercholesterolemia 09/10/2015   Hyperglycemia 09/10/2015   Malaise and fatigue 08/27/2015   Last Assessment &  Plan:  Relevant Hx: Course: Daily Update: Today's Plan:will get labs to rule out acute pathology   Electronically signed by: Baldemar Friday, [place] 08/27/15 1513   Morbid obesity ([place]) 11/02/2018   On supplemental oxygen therapy    01-04-2018  per pt prescribed as needed ,  states checks oxygen level daily,  and average between 88-90% on room air,  per pt has not needed O2 past 4 months   OSA on CPAP    Osteoarthritis of right knee 01/11/2020   Pain in left knee 12/28/2017   Pancreatic cancer ([place])    01-04-2018  per pt dx 10/ 2009 found by CT imaging, no surgery,  treatment chemotherapy  @ [facility],  in remission since 2017   Solitary thyroid nodule 09/10/2015     Family History  Problem Relation Age of Onset   Diabetes Mother      Social History   Socioeconomic History   Marital status: Single    Spouse name: Not on file   Number of children: Not on file   Years of education: Not on file   Highest education level: Not on file  Occupational History   Not on file  Tobacco Use   Smoking status: Former    Packs/day: 2.00    Years: 31.00    Total pack years: 62.00    Types: Cigarettes    Quit date: 2018    Years since quitting: 5.8   Smokeless tobacco: Never  Vaping Use   Vaping Use: Never used  Substance and  Sexual Activity   Alcohol use: Not Currently   Drug use: Never   Sexual activity: Not on file  Other Topics Concern   Not on file  Social History Narrative   Not on file   Social Determinants of Health   Financial Resource Strain: Not on file  Food Insecurity: Not on file  Transportation Needs: Not on file  Physical Activity: Not on file  Stress: Not on file  Social Connections: Not on file  Intimate Partner Violence: Not on file     No Known Allergies   Outpatient Medications Prior to Visit  Medication Sig Dispense Refill   albuterol (ACCUNEB) 0.63 MG/3ML nebulizer solution Take 3 mLs (0.63 mg total) by nebulization every 6 (six) hours as needed for  wheezing or shortness of breath. 360 mL 3   allopurinol (ZYLOPRIM) 100 MG tablet Take 100 mg by mouth daily.     ALPRAZolam (XANAX) 0.25 MG tablet Take 0.25 mg by mouth 3 (three) times daily as needed for anxiety.     aspirin EC 81 MG tablet Take 1 tablet (81 mg total) by mouth daily. Swallow whole. 90 tablet 3   atorvastatin (LIPITOR) 20 MG tablet TAKE 1 TABLET(20 MG) BY MOUTH DAILY 90 tablet 1   azelastine (ASTELIN) 0.1 % nasal spray Place 1 spray into both nostrils 2 (two) times daily. Use in each nostril as directed 90 mL 3   budesonide-formoterol (SYMBICORT) 160-4.5 MCG/ACT inhaler Inhale 2 puffs into the lungs 2 (two) times daily. 3 each 3   diclofenac Sodium (VOLTAREN) 1 % GEL Apply 1 application topically as needed for pain.     fluticasone (FLONASE) 50 MCG/ACT nasal spray Place 1 spray into both nostrils 2 (two) times daily. 48 g 3   HYDROcodone-acetaminophen (NORCO/VICODIN) 5-325 MG tablet Take 1 tablet by mouth as needed for pain.     ibuprofen (ADVIL) 200 MG tablet Take 200 mg by mouth every 6 (six) hours as needed for pain.     metolazone (ZAROXOLYN) 2.5 MG tablet TAKE 1 TABLET BY MOUTH EVERY DAY 90 tablet 2   montelukast (SINGULAIR) 10 MG tablet Take 1 tablet (10 mg total) by mouth at bedtime. 90 tablet 3   mupirocin ointment (BACTROBAN) 2 % Apply 1 application topically 2 (two) times daily.     naloxone (NARCAN) nasal spray 4 mg/0.1 mL Place 1 spray into the nose as needed for opioid reversal.     nitroGLYCERIN (NITROSTAT) 0.4 MG SL tablet Place 1 tablet (0.4 mg total) under the tongue every 5 (five) minutes as needed for chest pain. PLACE 1 TABLET UNDER THE TONGUE EVERY 5 MINUTES AS NEEDED FOR CHEST FOR PAIN Strength: 0.4 mg 25 tablet 7   NYSTATIN powder Apply 1 application. topically 2 (two) times daily.     omeprazole (PRILOSEC) 20 MG capsule Take 20 mg by mouth 2 (two) times daily.     pentoxifylline (TRENTAL) 400 MG CR tablet Take 400 mg by mouth 2 (two) times daily.      potassium chloride SA (KLOR-CON M) 20 MEQ tablet Take 2 tablets (40 mEq total) by mouth daily. 180 tablet 2   PROAIR HFA 108 (90 Base) MCG/ACT inhaler Inhale 2 puffs into the lungs every 4 (four) hours as needed for wheezing or shortness of breath. 18 g 3   Pseudoephedrine-Guaifenesin [phone] MG TB12 Take 1 tablet by mouth in the morning and at bedtime. 30 tablet 1   tamsulosin (FLOMAX) 0.4 MG CAPS capsule Take 0.4 mg by mouth  daily.     tiotropium (SPIRIVA) 18 MCG inhalation capsule Place 1 capsule (18 mcg total) into inhaler and inhale daily. 90 capsule 3   torsemide (DEMADEX) 10 MG tablet Take 5-10 mg by mouth as needed for edema.     No facility-administered medications prior to visit.    ROS    Objective:  Physical Exam   There were no vitals filed for this visit.  ***  CBC    Component Value Date/Time   WBC 12.1 (H) 04/23/2020 1145   RBC 6.29 (H) 04/23/2020 1145   HGB 18.8 (H) 04/23/2020 1145   HGB 16.9 01/09/2019 1410   HCT 55.8 (H) 04/23/2020 1145   HCT 50.8 01/09/2019 1410   PLT 232 04/23/2020 1145   PLT 314 01/09/2019 1410   MCV 88.7 04/23/2020 1145   MCV 91 01/09/2019 1410   MCH 29.9 04/23/2020 1145   MCHC 33.7 04/23/2020 1145   RDW 13.8 04/23/2020 1145   RDW 13.5 01/09/2019 1410   LYMPHSABS 2.0 11/17/2018 1023   EOSABS 0.3 11/17/2018 1023   BASOSABS 0.0 11/17/2018 1023     Pulmonary testing:  PFT 12/22/19 >> FEV1 0.57 (19%), FEV1% 88, TLC 5.55 (76%), DLCO 32%   Chest Imaging:  CT chest 11/01/19 >> moderate centrilobular and paraseptal emphysema, subcarinal LN 1.2 cm, Rt juxta esophageal LN 1.1 cm CT chest 03/14/20 >> moderate centrilobular emphysema, 3 mm nodule RUL new, 3 mm nodule RUL new, stable LAN CT chest 09/19/20 >> Rt upper nodule resolved   Sleep Tests:  PSG 04/06/19 >> AHI 8.9, SpO2 low 71%, CPAP 16 cm H2O CPAP 04/06/20 to 07/04/20 >> used on 90 of 90 nights with average 13 hrs 8 min.  Average AHI 1.2 with CPAP 16 cm H2O Astral 11/12/20 to  12/16/20 >> used on 35 of 35 nights with average 14 hrs 46 min.  Average AHI 0.6 with median pressure 18/10 and 95 th percentile pressure 29/15 cm H2O.  Median Vt 510 and 95 th percentile Vt 744 ml.     Cardiac Tests:  LHC 11/21/18 >> non obstructive CAD Echo 11/25/18 >> EF 60 to 65%      Assessment & Plan:   No diagnosis found.  Discussion: ***  Immunizations: Immunization History  Administered Date(s) Administered   Influenza Inj Mdck Quad Pf 02/27/2020   Influenza,inj,Quad PF,6+ Mos 04/09/2017   Influenza,inj,quad, With Preservative 02/26/2019   Moderna Sars-Covid-2 Vaccination 09/02/2019     Current Outpatient Medications:    albuterol (ACCUNEB) 0.63 MG/3ML nebulizer solution, Take 3 mLs (0.63 mg total) by nebulization every 6 (six) hours as needed for wheezing or shortness of breath., Disp: 360 mL, Rfl: 3   allopurinol (ZYLOPRIM) 100 MG tablet, Take 100 mg by mouth daily., Disp: , Rfl:    ALPRAZolam (XANAX) 0.25 MG tablet, Take 0.25 mg by mouth 3 (three) times daily as needed for anxiety., Disp: , Rfl:    aspirin EC 81 MG tablet, Take 1 tablet (81 mg total) by mouth daily. Swallow whole., Disp: 90 tablet, Rfl: 3   atorvastatin (LIPITOR) 20 MG tablet, TAKE 1 TABLET(20 MG) BY MOUTH DAILY, Disp: 90 tablet, Rfl: 1   azelastine (ASTELIN) 0.1 % nasal spray, Place 1 spray into both nostrils 2 (two) times daily. Use in each nostril as directed, Disp: 90 mL, Rfl: 3   budesonide-formoterol (SYMBICORT) 160-4.5 MCG/ACT inhaler, Inhale 2 puffs into the lungs 2 (two) times daily., Disp: 3 each, Rfl: 3   diclofenac Sodium (VOLTAREN) 1 % GEL,  Apply 1 application topically as needed for pain., Disp: , Rfl:    fluticasone (FLONASE) 50 MCG/ACT nasal spray, Place 1 spray into both nostrils 2 (two) times daily., Disp: 48 g, Rfl: 3   HYDROcodone-acetaminophen (NORCO/VICODIN) 5-325 MG tablet, Take 1 tablet by mouth as needed for pain., Disp: , Rfl:    ibuprofen (ADVIL) 200 MG tablet, Take 200 mg by  mouth every 6 (six) hours as needed for pain., Disp: , Rfl:    metolazone (ZAROXOLYN) 2.5 MG tablet, TAKE 1 TABLET BY MOUTH EVERY DAY, Disp: 90 tablet, Rfl: 2   montelukast (SINGULAIR) 10 MG tablet, Take 1 tablet (10 mg total) by mouth at bedtime., Disp: 90 tablet, Rfl: 3   mupirocin ointment (BACTROBAN) 2 %, Apply 1 application topically 2 (two) times daily., Disp: , Rfl:    naloxone (NARCAN) nasal spray 4 mg/0.1 mL, Place 1 spray into the nose as needed for opioid reversal., Disp: , Rfl:    nitroGLYCERIN (NITROSTAT) 0.4 MG SL tablet, Place 1 tablet (0.4 mg total) under the tongue every 5 (five) minutes as needed for chest pain. PLACE 1 TABLET UNDER THE TONGUE EVERY 5 MINUTES AS NEEDED FOR CHEST FOR PAIN Strength: 0.4 mg, Disp: 25 tablet, Rfl: 7   NYSTATIN powder, Apply 1 application. topically 2 (two) times daily., Disp: , Rfl:    omeprazole (PRILOSEC) 20 MG capsule, Take 20 mg by mouth 2 (two) times daily., Disp: , Rfl:    pentoxifylline (TRENTAL) 400 MG CR tablet, Take 400 mg by mouth 2 (two) times daily., Disp: , Rfl:    potassium chloride SA (KLOR-CON M) 20 MEQ tablet, Take 2 tablets (40 mEq total) by mouth daily., Disp: 180 tablet, Rfl: 2   PROAIR HFA 108 (90 Base) MCG/ACT inhaler, Inhale 2 puffs into the lungs every 4 (four) hours as needed for wheezing or shortness of breath., Disp: 18 g, Rfl: 3   Pseudoephedrine-Guaifenesin [phone] MG TB12, Take 1 tablet by mouth in the morning and at bedtime., Disp: 30 tablet, Rfl: 1   tamsulosin (FLOMAX) 0.4 MG CAPS capsule, Take 0.4 mg by mouth daily., Disp: , Rfl:    tiotropium (SPIRIVA) 18 MCG inhalation capsule, Place 1 capsule (18 mcg total) into inhaler and inhale daily., Disp: 90 capsule, Rfl: 3   torsemide (DEMADEX) 10 MG tablet, Take 5-10 mg by mouth as needed for edema., Disp: , Rfl:

## 2022-04-15 ENCOUNTER — Encounter: Payer: Self-pay | Admitting: Pulmonary Disease

## 2022-04-15 ENCOUNTER — Ambulatory Visit (INDEPENDENT_AMBULATORY_CARE_PROVIDER_SITE_OTHER): Payer: Medicaid Other | Admitting: Pulmonary Disease

## 2022-04-15 VITALS — BP 124/80 | HR 107 | Temp 98.5°F | Ht 72.0 in | Wt 300.0 lb

## 2022-04-15 DIAGNOSIS — J439 Emphysema, unspecified: Secondary | ICD-10-CM | POA: Diagnosis not present

## 2022-04-15 DIAGNOSIS — G4733 Obstructive sleep apnea (adult) (pediatric): Secondary | ICD-10-CM | POA: Diagnosis not present

## 2022-04-15 DIAGNOSIS — J329 Chronic sinusitis, unspecified: Secondary | ICD-10-CM

## 2022-04-15 DIAGNOSIS — J309 Allergic rhinitis, unspecified: Secondary | ICD-10-CM | POA: Diagnosis not present

## 2022-04-15 DIAGNOSIS — J9611 Chronic respiratory failure with hypoxia: Secondary | ICD-10-CM | POA: Diagnosis not present

## 2022-04-15 DIAGNOSIS — J4489 Other specified chronic obstructive pulmonary disease: Secondary | ICD-10-CM

## 2022-04-15 DIAGNOSIS — J449 Chronic obstructive pulmonary disease, unspecified: Secondary | ICD-10-CM

## 2022-04-15 LAB — CBC WITH DIFFERENTIAL/PLATELET
Basophils Absolute: 0.1 10*3/uL (ref 0.0–0.1)
Basophils Relative: 0.7 % (ref 0.0–3.0)
Eosinophils Absolute: 0.2 10*3/uL (ref 0.0–0.7)
Eosinophils Relative: 1.1 % (ref 0.0–5.0)
HCT: 47.5 % (ref 39.0–52.0)
Hemoglobin: 15.9 g/dL (ref 13.0–17.0)
Lymphocytes Relative: 24.1 % (ref 12.0–46.0)
Lymphs Abs: 3.6 10*3/uL (ref 0.7–4.0)
MCHC: 33.5 g/dL (ref 30.0–36.0)
MCV: 92.4 fl (ref 78.0–100.0)
Monocytes Absolute: 0.9 10*3/uL (ref 0.1–1.0)
Monocytes Relative: 6.2 % (ref 3.0–12.0)
Neutro Abs: 10.1 10*3/uL — ABNORMAL HIGH (ref 1.4–7.7)
Neutrophils Relative %: 67.9 % (ref 43.0–77.0)
Platelets: 260 10*3/uL (ref 150.0–400.0)
RBC: 5.14 Mil/uL (ref 4.22–5.81)
RDW: 14 % (ref 11.5–15.5)
WBC: 14.8 10*3/uL — ABNORMAL HIGH (ref 4.0–10.5)

## 2022-04-15 MED ORDER — ALBUTEROL SULFATE 0.63 MG/3ML IN NEBU: 1.0000 | INHALATION_SOLUTION | Freq: Four times a day (QID) | RESPIRATORY_TRACT | 3 refills | Status: DC | PRN

## 2022-04-15 MED ORDER — TIOTROPIUM BROMIDE MONOHYDRATE 18 MCG IN CAPS: 1.0000 | ORAL_CAPSULE | Freq: Every day | RESPIRATORY_TRACT | 3 refills | Status: DC

## 2022-04-15 MED ORDER — PROAIR HFA 108 (90 BASE) MCG/ACT IN AERS: 2.0000 | INHALATION_SPRAY | RESPIRATORY_TRACT | 3 refills | Status: DC | PRN

## 2022-04-15 MED ORDER — BUDESONIDE-FORMOTEROL FUMARATE 160-4.5 MCG/ACT IN AERO: 2.0000 | INHALATION_SPRAY | Freq: Two times a day (BID) | RESPIRATORY_TRACT | 3 refills | Status: DC

## 2022-04-15 NOTE — Patient Instructions (Signed)
Sleep disordered breathing: Continue noninvasive mechanical ventilation at night  COPD, severe: Continue Symbicort, continue Spiriva Albuterol as needed for chest tightness wheezing or shortness of breath RSV vaccine today  Acute on chronic sinusitis, allergic rhinitis: At this point I do not think more antibiotics are going to be helpful Continue using the saline rinses at home Start using over-the-counter cetirizine 10 mg daily Continue Astelin Continue Flonase We will do blood work today to look for evidence of allergy: CBC with differential, serum IgE, RAST profile In the short run, no more than 1 to 2 weeks it is okay to use Afrin 2 sprays each nostril twice a day to help relieve the congestion so you can get some rest, however, do not use it longer than that  Chronic respiratory failure with hypoxemia: Keep using oxygen as you are doing  We will see you back in 4 to 6 weeks with Dr. Halford Chessman or sooner if needed.  If needed specifically say you need to speak to the triage nurse for Dr. Halford Chessman when you call.

## 2022-04-15 NOTE — Addendum Note (Signed)
Addended by: Konrad Felix L on: 04/15/2022 01:48 PM   Modules accepted: Orders

## 2022-04-16 ENCOUNTER — Other Ambulatory Visit: Payer: Self-pay | Admitting: *Deleted

## 2022-04-16 MED ORDER — ALBUTEROL SULFATE HFA 108 (90 BASE) MCG/ACT IN AERS: 2.0000 | INHALATION_SPRAY | RESPIRATORY_TRACT | 5 refills | Status: DC | PRN

## 2022-04-17 ENCOUNTER — Other Ambulatory Visit: Payer: Self-pay | Admitting: *Deleted

## 2022-04-17 LAB — IGE: IgE (Immunoglobulin E), Serum: 592 kU/L — ABNORMAL HIGH (ref <=114)

## 2022-04-17 MED ORDER — MONTELUKAST SODIUM 10 MG PO TABS: 10.0000 mg | ORAL_TABLET | Freq: Every day | ORAL | 3 refills | Status: DC

## 2022-04-18 LAB — ALLERGEN PROFILE, PERENNIAL ALLERGEN IGE

## 2022-04-21 ENCOUNTER — Other Ambulatory Visit: Payer: Self-pay

## 2022-04-21 ENCOUNTER — Other Ambulatory Visit: Payer: Self-pay | Admitting: Cardiology

## 2022-04-21 MED ORDER — POTASSIUM CHLORIDE CRYS ER 20 MEQ PO TBCR: 40.0000 meq | EXTENDED_RELEASE_TABLET | Freq: Every day | ORAL | 0 refills | Status: DC

## 2022-04-23 ENCOUNTER — Other Ambulatory Visit: Payer: Self-pay | Admitting: *Deleted

## 2022-04-23 ENCOUNTER — Other Ambulatory Visit: Payer: Self-pay

## 2022-04-23 MED ORDER — BUDESONIDE-FORMOTEROL FUMARATE 160-4.5 MCG/ACT IN AERO: 2.0000 | INHALATION_SPRAY | Freq: Two times a day (BID) | RESPIRATORY_TRACT | 3 refills | Status: DC

## 2022-04-25 ENCOUNTER — Ambulatory Visit (HOSPITAL_BASED_OUTPATIENT_CLINIC_OR_DEPARTMENT_OTHER)
Admission: RE | Admit: 2022-04-25 | Discharge: 2022-04-25 | Disposition: A | Discharge status: Home / Self Care | Payer: Medicaid Other | Source: Ambulatory Visit | Attending: Pulmonary Disease | Admitting: Pulmonary Disease

## 2022-04-25 DIAGNOSIS — J329 Chronic sinusitis, unspecified: Secondary | ICD-10-CM | POA: Diagnosis present

## 2022-05-18 ENCOUNTER — Ambulatory Visit (HOSPITAL_BASED_OUTPATIENT_CLINIC_OR_DEPARTMENT_OTHER): Payer: Medicaid Other | Admitting: Pulmonary Disease

## 2022-06-02 ENCOUNTER — Ambulatory Visit (INDEPENDENT_AMBULATORY_CARE_PROVIDER_SITE_OTHER): Payer: Medicaid Other | Admitting: Pulmonary Disease

## 2022-06-02 ENCOUNTER — Encounter: Payer: Self-pay | Admitting: Pulmonary Disease

## 2022-06-02 VITALS — BP 126/78 | HR 90 | Ht 72.0 in | Wt 300.0 lb

## 2022-06-02 DIAGNOSIS — J4489 Other specified chronic obstructive pulmonary disease: Secondary | ICD-10-CM

## 2022-06-02 DIAGNOSIS — G4733 Obstructive sleep apnea (adult) (pediatric): Secondary | ICD-10-CM

## 2022-06-02 DIAGNOSIS — J309 Allergic rhinitis, unspecified: Secondary | ICD-10-CM | POA: Diagnosis not present

## 2022-06-02 DIAGNOSIS — J9611 Chronic respiratory failure with hypoxia: Secondary | ICD-10-CM | POA: Diagnosis not present

## 2022-06-02 DIAGNOSIS — J439 Emphysema, unspecified: Secondary | ICD-10-CM

## 2022-06-02 DIAGNOSIS — J329 Chronic sinusitis, unspecified: Secondary | ICD-10-CM | POA: Diagnosis not present

## 2022-06-02 DIAGNOSIS — J449 Chronic obstructive pulmonary disease, unspecified: Secondary | ICD-10-CM

## 2022-06-02 NOTE — Progress Notes (Unsigned)
Synopsis: Taylor Vargas is a 61 y.o. male former smoker with COPD, obstructive sleep apnea, and chronic hypoxic respiratory failure.  Subjective:   PATIENT ID: Taylor Vargas GENDER: male DOB: 05-Sep-1960, MRN: [id number]   HPI  Chief Complaint  Patient presents with   Follow-up    4wk f/u. States he is still no better since last visit.    Sinuses are still a major problem Lots of mucus production particularly in the evenings Dyspnea is bad but at baseline Uses hot water to get the mucus out of his nose He wonders if his home NIMV is causing his symptoms to worsen:he's been changing filters once a week, he is not changing the mask as frequently as what he had previously.  He says that he is currently having to use the hose for 60 days instead of every 30 days.   Past Medical History:  Diagnosis Date   Abnormal glucose 08/27/2015   Last Assessment & Plan:  Relevant Hx: Course: Daily Update: Today's Plan:will get an a1c especially given his increased urinary frequency  Electronically signed by: Baldemar Friday, FNP 08/27/15 1513   Arthritis    Benign prostatic hyperplasia with lower urinary tract symptoms 08/27/2015   Last Assessment & Plan:  Relevant Hx: Course: Daily Update: Today's Plan:taking flomax for this, but his was presumed, no workup for this diagnosis  Electronically signed by: Baldemar Friday, [place] 08/27/15 1513   Chest tightness 11/08/2018   Chronic diastolic heart failure ([place]) 04/21/2019   Chronic obstructive pulmonary disease ([place]) 08/27/2015   Last Assessment & Plan:  Relevant Hx: Course: Daily Update: Today's Plan:this is likely in acute exacerbation. I have discussed with him that I would like to et cxr to rule out acute pathology. He finally agrees to this. His sat came up to 89% on RA He understands s/e's of the prednisone and if he does have an element of diabetes or prediabtes this could make that worse for him  Electronically sig   Chronic pain of both knees  01/04/2020   Formatting of this note might be different from the original. Added automatically from request for surgery [id number]   Congenital absence of one kidney    01-04-2018 per pt unsure which one   COPD (chronic obstructive pulmonary disease) ([place])    pulmologist-  dr Alcide Clever [name])   Coronary artery calcification seen on CAT scan 11/30/2019   GERD (gastroesophageal reflux disease)    Hypercholesterolemia 09/10/2015   Hyperglycemia 09/10/2015   Malaise and fatigue 08/27/2015   Last Assessment & Plan:  Relevant Hx: Course: Daily Update: Today's Plan:will get labs to rule out acute pathology   Electronically signed by: Baldemar Friday, [place] 08/27/15 1513   Morbid obesity ([place]) 11/02/2018   On supplemental oxygen therapy    01-04-2018  per pt prescribed as needed ,  states checks oxygen level daily,  and average between 88-90% on room air,  per pt has not needed O2 past 4 months   OSA on CPAP    Osteoarthritis of right knee 01/11/2020   Pain in left knee 12/28/2017   Pancreatic cancer ([place])    01-04-2018  per pt dx 10/ 2009 found by CT imaging, no surgery,  treatment chemotherapy  @ [facility],  in remission since 2017   Solitary thyroid nodule 09/10/2015      Review of Systems  Constitutional:  Negative for diaphoresis, fever, malaise/fatigue and weight loss.  HENT:  Positive for congestion, ear pain and sinus pain. Negative  for sore throat.   Respiratory:  Positive for cough, sputum production and wheezing. Negative for shortness of breath.   Cardiovascular:  Negative for palpitations, orthopnea, claudication and leg swelling.      Objective:  Physical Exam   Vitals:   06/02/22 1058  BP: 126/78  Pulse: 90  SpO2: 97%  Weight: 300 lb (136.1 kg)  Height: 6' (1.829 m)    Gen: chronically ill appearing HENT: OP clear, neck supple PULM: CTA B (no wheezing), normal effort  CV: RRR, no mgr GI: BS+, soft, nontender Derm: no cyanosis or rash MSK: diminished bulk and  tone Psyche: normal mood and affect    CBC    Component Value Date/Time   WBC 14.8 (H) 04/15/2022 1128   RBC 5.14 04/15/2022 1128   HGB 15.9 04/15/2022 1128   HGB 16.9 01/09/2019 1410   HCT 47.5 04/15/2022 1128   HCT 50.8 01/09/2019 1410   PLT 260.0 04/15/2022 1128   PLT 314 01/09/2019 1410   MCV 92.4 04/15/2022 1128   MCV 91 01/09/2019 1410   MCH 29.9 04/23/2020 1145   MCHC 33.5 04/15/2022 1128   RDW 14.0 04/15/2022 1128   RDW 13.5 01/09/2019 1410   LYMPHSABS 3.6 04/15/2022 1128   LYMPHSABS 2.0 11/17/2018 1023   MONOABS 0.9 04/15/2022 1128   EOSABS 0.2 04/15/2022 1128   EOSABS 0.3 11/17/2018 1023   BASOSABS 0.1 04/15/2022 1128   BASOSABS 0.0 11/17/2018 1023     Pulmonary testing:  PFT 12/22/19 >> FEV1 0.57 (19%), FEV1% 88, TLC 5.55 (76%), DLCO 32%   Chest Imaging:  CT chest 11/01/19 >> moderate centrilobular and paraseptal emphysema, subcarinal LN 1.2 cm, Rt juxta esophageal LN 1.1 cm CT chest 03/14/20 >> moderate centrilobular emphysema, 3 mm nodule RUL new, 3 mm nodule RUL new, stable LAN CT chest 09/19/20 >> Rt upper nodule resolved CT sinuses 04/2022 > negative study   Sleep Tests:  PSG 04/06/19 >> AHI 8.9, SpO2 low 71%, CPAP 16 cm H2O CPAP 04/06/20 to 07/04/20 >> used on 90 of 90 nights with average 13 hrs 8 min.  Average AHI 1.2 with CPAP 16 cm H2O Astral 11/12/20 to 12/16/20 >> used on 35 of 35 nights with average 14 hrs 46 min.  Average AHI 0.6 with median pressure 18/10 and 95 th percentile pressure 29/15 cm H2O.  Median Vt 510 and 95 th percentile Vt 744 ml.     Cardiac Tests:  LHC 11/21/18 >> non obstructive CAD Echo 11/25/18 >> EF 60 to 65%  Labs: November 2023 RAST profile negative, serum IgE 592 kU/L, eosinophils 200 cell/dL    Assessment & Plan:   Allergic rhinitis, unspecified seasonality, unspecified trigger  Chronic sinusitis, unspecified location  Chronic respiratory failure with hypoxia (HCC)  OSA and COPD overlap syndrome ([place])  COPD with  chronic bronchitis and emphysema (HCC)  Asthma-COPD overlap syndrome  Discussion: 61 year old male returns to clinic complaining of severe difficulty breathing at night predominantly due to severe allergic rhinitis.  CT sinuses showed no evidence of anything that needed to be corrected surgically.  He is on maximal medical therapy.  Serum IgE is extremely high but RAST testing showed no specific allergen.  He may or may not have some sort of reaction to his noninvasive mechanical ventilation mask and tubing which unfortunately have not been changed in a timely manner based on supply issues from his DME provider.  The best approach moving forward is to try to control his allergic rhinitis and COPD asthma  overlap syndrome with Xolair.  Plan: Severe COPD/asthma overlap syndrome: Continue Symbicort and Spiriva Continue albuterol as needed Start Xolair injections  Allergic rhinitis with severely elevated IgE Xolair as above Continue Astelin Continue Flonase Continue saline rinses Continue cetirizine  Chronic respiratory failure with hypoxemia: Continue continuous oxygen as you are doing  Sleep disordered breathing, OSA with COPD: Continue noninvasive mechanical ventilation at night We will ask your home DME company to provide tubing and masks every 30 days instead of every 60 days as they are doing now Continue changing the filter on your device weekly  We will try to make arrangements for follow-up with Dr. Halford Chessman in 4 to 6 weeks, sooner if needed.  Immunizations: Immunization History  Administered Date(s) Administered   Influenza Inj Mdck Quad Pf 02/27/2020   Influenza,inj,Quad PF,6+ Mos 04/09/2017   Influenza,inj,quad, With Preservative 02/26/2019   Moderna Sars-Covid-2 Vaccination 09/02/2019   Respiratory Syncytial Virus Vaccine,Recomb Aduvanted(Arexvy) 04/15/2022     Current Outpatient Medications:    albuterol (ACCUNEB) 0.63 MG/3ML nebulizer solution, Take 3 mLs (0.63 mg  total) by nebulization every 6 (six) hours as needed for wheezing or shortness of breath., Disp: 360 mL, Rfl: 3   albuterol (PROVENTIL HFA) 108 (90 Base) MCG/ACT inhaler, Inhale 2 puffs into the lungs every 4 (four) hours as needed for wheezing or shortness of breath., Disp: 18 g, Rfl: 5   allopurinol (ZYLOPRIM) 100 MG tablet, Take 100 mg by mouth daily., Disp: , Rfl:    ALPRAZolam (XANAX) 0.25 MG tablet, Take 0.25 mg by mouth 3 (three) times daily as needed for anxiety., Disp: , Rfl:    aspirin EC 81 MG tablet, Take 1 tablet (81 mg total) by mouth daily. Swallow whole., Disp: 90 tablet, Rfl: 3   atorvastatin (LIPITOR) 20 MG tablet, TAKE 1 TABLET(20 MG) BY MOUTH DAILY, Disp: 90 tablet, Rfl: 1   azelastine (ASTELIN) 0.1 % nasal spray, Place 1 spray into both nostrils 2 (two) times daily. Use in each nostril as directed, Disp: 90 mL, Rfl: 3   budesonide-formoterol (SYMBICORT) 160-4.5 MCG/ACT inhaler, Inhale 2 puffs into the lungs 2 (two) times daily., Disp: 30.6 g, Rfl: 3   diclofenac Sodium (VOLTAREN) 1 % GEL, Apply 1 application topically as needed for pain., Disp: , Rfl:    fluticasone (FLONASE) 50 MCG/ACT nasal spray, Place 1 spray into both nostrils 2 (two) times daily., Disp: 48 g, Rfl: 3   HYDROcodone-acetaminophen (NORCO/VICODIN) 5-325 MG tablet, Take 1 tablet by mouth as needed for pain., Disp: , Rfl:    ibuprofen (ADVIL) 200 MG tablet, Take 200 mg by mouth every 6 (six) hours as needed for pain., Disp: , Rfl:    metolazone (ZAROXOLYN) 2.5 MG tablet, TAKE 1 TABLET BY MOUTH EVERY DAY, Disp: 90 tablet, Rfl: 2   montelukast (SINGULAIR) 10 MG tablet, Take 1 tablet (10 mg total) by mouth at bedtime., Disp: 90 tablet, Rfl: 3   mupirocin ointment (BACTROBAN) 2 %, Apply 1 application topically 2 (two) times daily., Disp: , Rfl:    naloxone (NARCAN) nasal spray 4 mg/0.1 mL, Place 1 spray into the nose as needed for opioid reversal., Disp: , Rfl:    nitroGLYCERIN (NITROSTAT) 0.4 MG SL tablet, Place 1  tablet (0.4 mg total) under the tongue every 5 (five) minutes as needed for chest pain. PLACE 1 TABLET UNDER THE TONGUE EVERY 5 MINUTES AS NEEDED FOR CHEST FOR PAIN Strength: 0.4 mg, Disp: 25 tablet, Rfl: 7   NYSTATIN powder, Apply 1 application. topically 2 (  two) times daily., Disp: , Rfl:    omeprazole (PRILOSEC) 20 MG capsule, Take 20 mg by mouth 2 (two) times daily., Disp: , Rfl:    pentoxifylline (TRENTAL) 400 MG CR tablet, Take 400 mg by mouth 2 (two) times daily., Disp: , Rfl:    potassium chloride SA (KLOR-CON M) 20 MEQ tablet, Take 2 tablets (40 mEq total) by mouth daily., Disp: 180 tablet, Rfl: 0   Pseudoephedrine-Guaifenesin [id number] MG TB12, Take 1 tablet by mouth in the morning and at bedtime., Disp: 30 tablet, Rfl: 1   tamsulosin (FLOMAX) 0.4 MG CAPS capsule, Take 0.4 mg by mouth daily., Disp: , Rfl:    tiotropium (SPIRIVA) 18 MCG inhalation capsule, Place 1 capsule (18 mcg total) into inhaler and inhale daily., Disp: 90 capsule, Rfl: 3   torsemide (DEMADEX) 10 MG tablet, Take 5-10 mg by mouth as needed for edema., Disp: , Rfl:

## 2022-06-02 NOTE — Patient Instructions (Signed)
Severe COPD/asthma overlap syndrome: Continue Symbicort and Spiriva Continue albuterol as needed Start Xolair injections  Allergic rhinitis with severely elevated IgE Xolair as above Continue Astelin Continue Flonase Continue saline rinses Continue cetirizine  Chronic respiratory failure with hypoxemia: Continue continuous oxygen as you are doing  Sleep disordered breathing, OSA with COPD: Continue noninvasive mechanical ventilation at night We will ask your home DME company to provide tubing and masks every 30 days instead of every 60 days as they are doing now Continue changing the filter on your device weekly  We will try to make arrangements for follow-up with Dr. Halford Chessman in 4 to 6 weeks, sooner if needed.

## 2022-06-03 ENCOUNTER — Telehealth: Payer: Self-pay | Admitting: Pulmonary Disease

## 2022-06-03 NOTE — Telephone Encounter (Signed)
Patient was seen yesterday by BQ for an acute visit. After the visit, he stated that he would like to stay with a provider at the [facility]. Location as the [facility] location was too far from him to drive. He wanted to thank Dr. Halford Chessman for taking great care of him. He would like to switch to BQ. I went over the office protocol for switching providers and he verbalized understanding.   I spoke with BQ verbally and he was ok with the switch.   Dr. Halford Chessman, are you ok with him switching over to Dr. [place] Bells?

## 2022-06-04 ENCOUNTER — Telehealth: Payer: Self-pay | Admitting: Pharmacist

## 2022-06-04 DIAGNOSIS — J4489 Other specified chronic obstructive pulmonary disease: Secondary | ICD-10-CM

## 2022-06-04 NOTE — Telephone Encounter (Signed)
Received new start paperwork for Xolair.  Dose: '375mg'$  SQ every 2 weeks (appropriate based on pre-treatment weight of 136.1kg and baseline IgE of 592 on 04/15/2022)  Submitted a Prior Authorization request to [place] for XOLAIR '150mg'$ /ml syringe via CoverMyMeds. Will update once we receive a response.  Key: [place] PAP application in PAP pending info folder in pharmacy office  Knox Saliva, PharmD, MPH, BCPS, CPP Clinical Pharmacist (Rheumatology and Pulmonology)

## 2022-06-05 ENCOUNTER — Other Ambulatory Visit (HOSPITAL_COMMUNITY): Payer: Self-pay

## 2022-06-05 NOTE — Telephone Encounter (Signed)
Received notification from [place] regarding a prior authorization for XOLAIR. Authorization has been APPROVED from 06/04/2022 to 06/05/23. Approval letter sent to scan center.  Per test claim, copay for 28 days supply is $4 for 2 x '150mg'$  PFS Per test claim, copay for 28 days supply is $4 for 1 x '75mg'$  PFS Total copay for 28 day supply is $8  Patient can fill through [facility]: [phone]   Authorization # [id number] Phone # [phone]  Patient will need Epipen to be on Xolair. He will need to bring Epipen to all office visits and have on hand moving forward. First dose will require 2 hours of monitoring. Second and third doses will require 30 minutes of monitoring.  He can be scheduled for Xolair new start once medication is scheduled with [facility].  Knox Saliva, PharmD, MPH, BCPS, CPP Clinical Pharmacist (Rheumatology and Pulmonology)

## 2022-06-07 NOTE — Telephone Encounter (Signed)
Okay with me 

## 2022-06-09 ENCOUNTER — Other Ambulatory Visit: Payer: Self-pay

## 2022-06-09 ENCOUNTER — Other Ambulatory Visit (HOSPITAL_COMMUNITY): Payer: Self-pay

## 2022-06-09 MED ORDER — EPINEPHRINE 0.3 MG/0.3ML IJ SOAJ: 0.3000 mg | INTRAMUSCULAR | 5 refills | Status: AC | PRN

## 2022-06-09 MED ORDER — EPINEPHRINE 0.3 MG/0.3ML IJ SOAJ: 0.3000 mg | INTRAMUSCULAR | 5 refills | Status: DC | PRN

## 2022-06-09 MED ORDER — OMALIZUMAB 75 MG/0.5ML ~~LOC~~ SOSY
375.0000 mg | PREFILLED_SYRINGE | SUBCUTANEOUS | 0 refills | Status: DC
  Filled 2022-06-09: qty 1, 28d supply, fill #0

## 2022-06-09 MED ORDER — OMALIZUMAB 150 MG/ML ~~LOC~~ SOSY
375.0000 mg | PREFILLED_SYRINGE | SUBCUTANEOUS | 0 refills | Status: DC
  Filled 2022-06-09: qty 4, 28d supply, fill #0

## 2022-06-09 NOTE — Progress Notes (Signed)
HPI Patient presents today to [facility] to see pharmacy team for Xolair new start.  Past medical history includes asthma-COPD overlap, OSA, CAD, diastolic heart failure, GERD, history of pancreatic cancer  He does have a caretaker named "Bellmary" for a little less than 3 hours per day. She speaks minimal [place]. Patient plans to train her on Epipen use.  He has previously given himself injections  Epipen on hand and in date: Yes  Respiratory Medications Current regimen: Symbicort 160-4.80mg (2 puffs twice daily),  montelukast '10mg'$  nightly Patient reports no known adherence challenges  OBJECTIVE No Known Allergies  Outpatient Encounter Medications as of 06/19/2022  Medication Sig   albuterol (ACCUNEB) 0.63 MG/3ML nebulizer solution Take 3 mLs (0.63 mg total) by nebulization every 6 (six) hours as needed for wheezing or shortness of breath.   albuterol (PROVENTIL HFA) 108 (90 Base) MCG/ACT inhaler Inhale 2 puffs into the lungs every 4 (four) hours as needed for wheezing or shortness of breath.   allopurinol (ZYLOPRIM) 100 MG tablet Take 100 mg by mouth daily.   ALPRAZolam (XANAX) 0.25 MG tablet Take 0.25 mg by mouth 3 (three) times daily as needed for anxiety.   aspirin EC 81 MG tablet Take 1 tablet (81 mg total) by mouth daily. Swallow whole.   atorvastatin (LIPITOR) 20 MG tablet TAKE 1 TABLET(20 MG) BY MOUTH DAILY   azelastine (ASTELIN) 0.1 % nasal spray Place 1 spray into both nostrils 2 (two) times daily. Use in each nostril as directed   budesonide-formoterol (SYMBICORT) 160-4.5 MCG/ACT inhaler Inhale 2 puffs into the lungs 2 (two) times daily.   diclofenac Sodium (VOLTAREN) 1 % GEL Apply 1 application topically as needed for pain.   EPINEPHrine (EPIPEN 2-PAK) 0.3 mg/0.3 mL IJ SOAJ injection Inject 0.3 mg into the muscle as needed for anaphylaxis.   fluticasone (FLONASE) 50 MCG/ACT nasal spray Place 1 spray into both nostrils 2 (two) times daily.    HYDROcodone-acetaminophen (NORCO/VICODIN) 5-325 MG tablet Take 1 tablet by mouth as needed for pain.   ibuprofen (ADVIL) 200 MG tablet Take 200 mg by mouth every 6 (six) hours as needed for pain.   metolazone (ZAROXOLYN) 2.5 MG tablet TAKE 1 TABLET BY MOUTH EVERY DAY   montelukast (SINGULAIR) 10 MG tablet Take 1 tablet (10 mg total) by mouth at bedtime.   mupirocin ointment (BACTROBAN) 2 % Apply 1 application topically 2 (two) times daily.   naloxone (NARCAN) nasal spray 4 mg/0.1 mL Place 1 spray into the nose as needed for opioid reversal.   nitroGLYCERIN (NITROSTAT) 0.4 MG SL tablet Place 1 tablet (0.4 mg total) under the tongue every 5 (five) minutes as needed for chest pain. PLACE 1 TABLET UNDER THE TONGUE EVERY 5 MINUTES AS NEEDED FOR CHEST FOR PAIN Strength: 0.4 mg   NYSTATIN powder Apply 1 application. topically 2 (two) times daily.   omalizumab (Arvid Right 150 MG/ML prefilled syringe Inject 375 mg into the skin every 14 (fourteen) days. Courier to pulm: [address] [place], [employer] [id number] Appt on 06/19/2022   omalizumab (Arvid Right 75 MG/0.5ML prefilled syringe Inject 375 mg into the skin every 14 (fourteen) days. Courier to pulm: [id number] [place], [place] [id number] Appt on 06/19/2022   omeprazole (PRILOSEC) 20 MG capsule Take 20 mg by mouth 2 (two) times daily.   pentoxifylline (TRENTAL) 400 MG CR tablet Take 400 mg by mouth 2 (two) times daily.   potassium chloride SA (KLOR-CON M) 20 MEQ tablet Take 2  tablets (40 mEq total) by mouth daily.   Pseudoephedrine-Guaifenesin [phone] MG TB12 Take 1 tablet by mouth in the morning and at bedtime.   tamsulosin (FLOMAX) 0.4 MG CAPS capsule Take 0.4 mg by mouth daily.   tiotropium (SPIRIVA) 18 MCG inhalation capsule Place 1 capsule (18 mcg total) into inhaler and inhale daily.   torsemide (DEMADEX) 10 MG tablet Take 5-10 mg by mouth as needed for edema.   [DISCONTINUED] atorvastatin (LIPITOR) 20 MG tablet TAKE 1 TABLET(20 MG) BY  MOUTH DAILY   [DISCONTINUED] nitroGLYCERIN (NITROSTAT) 0.4 MG SL tablet PLACE 1 TABLET UNDER THE TONGUE EVERY 5 MINUTES AS NEEDED FOR CHEST FOR PAIN   No facility-administered encounter medications on file as of 06/19/2022.     Immunization History  Administered Date(s) Administered   Influenza Inj Mdck Quad Pf 02/27/2020   Influenza,inj,Quad PF,6+ Mos 04/09/2017   Influenza,inj,quad, With Preservative 02/26/2019   Moderna Sars-Covid-2 Vaccination 09/02/2019   Respiratory Syncytial Virus Vaccine,Recomb Aduvanted(Arexvy) 04/15/2022     PFTs    11/22/2019   10:43 AM  PFT Results  TLC 5.55         This result is from an external source.     Eosinophils Most recent blood eosinophil count was 200 cells/microL taken on 04/15/2022.   IgE: 592 on 04/15/2022 Pre-treatment weight: 136.1kg   Assessment   Biologics training for omalizumab (Xolair)  Goals of therapy: Mechanism: IgG monoclonal antibody (recombinant DNA derived) which inhibits IgE binding to the high-affinity IgE receptor on mast cells and basophils.  Reviewed that Xolair is add-on medication and patient must continue maintenance inhaler regimen. Response to therapy: may take 3 to 6 months to determine efficacy. Discussed that patients generally feel improvement sooner than 3 months.  Side effects: anaphylaxis (0.1%) (black boxed warning), injection site reaction (45%), arthralgia (2.9% to 8%), headache (3% to 15%)  Dose: '375mg'$  every 2 weeks based on pre-treatment IgE of 592 and weight of 136.1 kg. Patient advised that each dose will be 2 purple and one blue pre-filled syringes.  Administration/Storage:  Reviewed administration sites of thigh or abdomen (at least 2-3 inches away from abdomen). Reviewed the upper arm is only appropriate if caregiver is administering injection. Patient advised of injection site reaction management. Discussed that injections may take 5 to 10 seconds to administer (solution is slightly  viscous). Do not inject into moles, scars, bruises, tender areas, or broken skin. Do not shake pre-filled syringe as this could lead to product foaming or precipitation. Do not use if solution is discolored or contains particulate matter or if window on prefilled pen is yellow (indicates pen has been used).  Reviewed storage of medication in refrigerator. Reviewed that Xolair can be stored at room temperature in unopened carton for up to 4 hours only. Trained on Epipen use today and he plans to train his caretaker on use.  Access: Approval of Xolair through: insurance  Patient self-administered Xolair '150mg'$ /mL x 2 in right upper thigh and left upper thigh and Xolair '75mg'$ /0.32m x 1  in left upper thigh using sample Xolair '150mg'$ /mL pre-filled syringe x 1 syringes NDC: [address]-01 Lot: [id number]: 03/2023  Xolair '150mg'$ /mL pre-filled syringe x 1 syringes NDC: [address]-01 Lot: [id number]: 02/2023  Xolair '75mg'$ /0.583mpre-filled syringe x 1 syringe NDC: [address]-01 Lot: [id number]: 12/2022  Patient monitored for 2 hours for adverse reaction (checked in with patient every 20 minutes).  Patient tolerated without issue. Injection site checked and no redness or swelling noted. Administered at 0902: no issues.  0920: no issues reported by pt; denies itchiness, irritation, difficulty breathing, provided with water 0940: no issues reported by pt; denies itchiness, irritation, difficulty breathing 1000: no issues reported by pt; denies itchiness, irritation, difficulty breathing 1020: doing well 1040: no issues per pt 1100: no issues, patient eager to head home   Medication Reconciliation  A drug regimen assessment was performed, including review of allergies, interactions, disease-state management, dosing and immunization history. Medications were reviewed with the patient, including name, instructions, indication, goals of therapy, potential side effects,  importance of adherence, and safe use.  Drug interaction(s): none noted  PLAN Second Xolair dose will be administered in clinic with 30 minute monitoring period. Scheduled for 07/03/2022. Patient advised that Epipen must be on hand at each visit. Third Xolair will be administered in clinic with 30 minute monitoring period - appointment will be scheduled at second-dose visit. Medication for second dose has been received and is in Pyxis. Pt provided with receipt/bill from pharmacy as requested. Continue maintenance asthma regimen of: Symbicort 160-4.47mg (2 puffs twice daily),  montelukast '10mg'$  nightly  All questions encouraged and answered.  Instructed patient to reach out with any further questions or concerns.  Thank you for allowing pharmacy to participate in this patient's care.  This appointment required 120 minutes of patient care (this includes precharting, chart review, review of results, face-to-face care, etc.).

## 2022-06-09 NOTE — Telephone Encounter (Signed)
ATC patient regarding Xolair new start. Unable to reach. Left VM requesting return call. Advised him to reach back out once Epipen has been picked up from pharmacy  I am not at pulm clinic in the mornings so will have to configure around his schedule. He may have to arrange for alternate transportation as there is a 2-hour monitoring period  Knox Saliva, PharmD, MPH, BCPS, CPP Clinical Pharmacist (Rheumatology and Pulmonology)

## 2022-06-09 NOTE — Telephone Encounter (Signed)
Received return call from patient regarding Xolair. He states he only has caregiver for 3-4 hours daily. He will coordinate caregiver being with him for longer on Friday, 06/19/2022. Scheduled for new start on 06/19/2022. Advised that first three doses will need to be completed in clinic.  First dose will require 2 hours of monitoring. Second and third doses will require 30 minutes of monitoring.  Advised of Epipen requirement and that rx will be couriered to clinic. Advised that [facility] will reach out to him once he is back in clinic.   Routing to [facility] for onboarding needs  Knox Saliva, PharmD, MPH, BCPS, CPP Clinical Pharmacist (Rheumatology and Pulmonology)

## 2022-06-09 NOTE — Telephone Encounter (Signed)
Called and spoke with pt to let him know that VS said he was okay with pt switching to Dr. [place] Bells and he verbalized understanding. Asked pt if we could go ahead and schedule a visit with BQ to reassess after his last acute visit and pt said he was hoping to have a visit scheduled for when he could come to the office to begin Xolair injections.  Pt said he has a care taker that brings him to and from appts and the only time he has a care taker is between 9:30-1:15.  Pt is really hoping that he is able to begin the injections soon.  Devki, please advise on this.

## 2022-06-09 NOTE — Telephone Encounter (Signed)
VM has been left with patient regarding Xolair. Will need Epipen for all visits and for future while on Xolair (Rx for Epipen sent to [facility] today)  Knox Saliva, PharmD, MPH, BCPS, CPP Clinical Pharmacist (Rheumatology and Pulmonology)

## 2022-06-10 ENCOUNTER — Other Ambulatory Visit (HOSPITAL_COMMUNITY): Payer: Self-pay

## 2022-06-10 NOTE — Telephone Encounter (Signed)
Delivery instructions have been updated in [place], medication will be couriered to [facility] by 06/17/22.  Rx has been processed in [facility] and there is a copay of $8.00. Payment information has been collected and forwarded to the pharmacy.

## 2022-06-12 ENCOUNTER — Other Ambulatory Visit (HOSPITAL_COMMUNITY): Payer: Self-pay

## 2022-06-16 ENCOUNTER — Other Ambulatory Visit (HOSPITAL_COMMUNITY): Payer: Self-pay

## 2022-06-17 ENCOUNTER — Other Ambulatory Visit (HOSPITAL_COMMUNITY): Payer: Self-pay

## 2022-06-19 ENCOUNTER — Ambulatory Visit: Payer: Medicaid Other | Admitting: Pharmacist

## 2022-06-19 DIAGNOSIS — J4489 Other specified chronic obstructive pulmonary disease: Secondary | ICD-10-CM

## 2022-06-19 DIAGNOSIS — Z7189 Other specified counseling: Secondary | ICD-10-CM

## 2022-06-19 NOTE — Patient Instructions (Signed)
See you on 07/03/2022 @ 10:30am.  Bring your Epipen with you (this is REQUIRED)  There will be 30 minute monitoring period.

## 2022-06-26 ENCOUNTER — Other Ambulatory Visit (HOSPITAL_COMMUNITY): Payer: Self-pay

## 2022-07-01 NOTE — Progress Notes (Signed)
HPI Patient presents today to [facility] to see pharmacy team for second dose of Xolair.  Past medical history includes asthma-COPD overlap, OSA, CAD, diastolic heart failure, GERD, history of pancreatic cancer   Patient completed their first dose on 06/19/2022 with two hour monitoring period. Patient tolerated first dose without issues. He denies any issues at home as well  Epipen on hand and in date: Yes  No changes in medications or conditions since last visit  Respiratory Medications Current regimen: Symbicort 160-4.4mg (2 puffs twice daily),  montelukast '10mg'$  nightly Patient reports no known adherence challenges  OBJECTIVE No Known Allergies  Outpatient Encounter Medications as of 07/03/2022  Medication Sig   albuterol (ACCUNEB) 0.63 MG/3ML nebulizer solution Take 3 mLs (0.63 mg total) by nebulization every 6 (six) hours as needed for wheezing or shortness of breath.   albuterol (PROVENTIL HFA) 108 (90 Base) MCG/ACT inhaler Inhale 2 puffs into the lungs every 4 (four) hours as needed for wheezing or shortness of breath.   allopurinol (ZYLOPRIM) 100 MG tablet Take 100 mg by mouth daily.   ALPRAZolam (XANAX) 0.25 MG tablet Take 0.25 mg by mouth 3 (three) times daily as needed for anxiety.   aspirin EC 81 MG tablet Take 1 tablet (81 mg total) by mouth daily. Swallow whole.   atorvastatin (LIPITOR) 20 MG tablet TAKE 1 TABLET(20 MG) BY MOUTH DAILY   azelastine (ASTELIN) 0.1 % nasal spray Place 1 spray into both nostrils 2 (two) times daily. Use in each nostril as directed   budesonide-formoterol (SYMBICORT) 160-4.5 MCG/ACT inhaler Inhale 2 puffs into the lungs 2 (two) times daily.   diclofenac Sodium (VOLTAREN) 1 % GEL Apply 1 application topically as needed for pain.   EPINEPHrine (EPIPEN 2-PAK) 0.3 mg/0.3 mL IJ SOAJ injection Inject 0.3 mg into the muscle as needed for anaphylaxis.   fluticasone (FLONASE) 50 MCG/ACT nasal spray Place 1 spray into both nostrils 2 (two) times  daily.   HYDROcodone-acetaminophen (NORCO/VICODIN) 5-325 MG tablet Take 1 tablet by mouth as needed for pain.   ibuprofen (ADVIL) 200 MG tablet Take 200 mg by mouth every 6 (six) hours as needed for pain.   metolazone (ZAROXOLYN) 2.5 MG tablet TAKE 1 TABLET BY MOUTH EVERY DAY   montelukast (SINGULAIR) 10 MG tablet Take 1 tablet (10 mg total) by mouth at bedtime.   mupirocin ointment (BACTROBAN) 2 % Apply 1 application topically 2 (two) times daily.   naloxone (NARCAN) nasal spray 4 mg/0.1 mL Place 1 spray into the nose as needed for opioid reversal.   nitroGLYCERIN (NITROSTAT) 0.4 MG SL tablet Place 1 tablet (0.4 mg total) under the tongue every 5 (five) minutes as needed for chest pain. PLACE 1 TABLET UNDER THE TONGUE EVERY 5 MINUTES AS NEEDED FOR CHEST FOR PAIN Strength: 0.4 mg   NYSTATIN powder Apply 1 application. topically 2 (two) times daily.   omalizumab (Arvid Right 150 MG/ML prefilled syringe Inject 375 mg into the skin every 14 (fourteen) days. [address]ourier to pulm: [address], [place] [address] Appt on 06/19/2022   omalizumab (Arvid Right 75 MG/0.5ML prefilled syringe Inject 375 mg into the skin every 14 (fourteen) days. Courier to pulm: [address], [place] [id number] Appt on 06/19/2022   omeprazole (PRILOSEC) 20 MG capsule Take 20 mg by mouth 2 (two) times daily.   pentoxifylline (TRENTAL) 400 MG CR tablet Take 400 mg by mouth 2 (two) times daily.   potassium chloride SA (KLOR-CON M) 20 MEQ  tablet Take 2 tablets (40 mEq total) by mouth daily.   Pseudoephedrine-Guaifenesin [phone] MG TB12 Take 1 tablet by mouth in the morning and at bedtime.   tamsulosin (FLOMAX) 0.4 MG CAPS capsule Take 0.4 mg by mouth daily.   tiotropium (SPIRIVA) 18 MCG inhalation capsule Place 1 capsule (18 mcg total) into inhaler and inhale daily.   torsemide (DEMADEX) 10 MG tablet Take 5-10 mg by mouth as needed for edema.   [DISCONTINUED] atorvastatin (LIPITOR) 20 MG tablet TAKE 1 TABLET(20  MG) BY MOUTH DAILY   [DISCONTINUED] nitroGLYCERIN (NITROSTAT) 0.4 MG SL tablet PLACE 1 TABLET UNDER THE TONGUE EVERY 5 MINUTES AS NEEDED FOR CHEST FOR PAIN   No facility-administered encounter medications on file as of 07/03/2022.     Immunization History  Administered Date(s) Administered   Influenza Inj Mdck Quad Pf 02/27/2020   Influenza,inj,Quad PF,6+ Mos 04/09/2017   Influenza,inj,quad, With Preservative 02/26/2019   Moderna Sars-Covid-2 Vaccination 09/02/2019   Respiratory Syncytial Virus Vaccine,Recomb Aduvanted(Arexvy) 04/15/2022     PFTs    11/22/2019   10:43 AM  PFT Results  TLC 5.55         This result is from an external source.     Eosinophils Most recent blood eosinophil count was 200 cells/microL taken on 04/15/2022.    IgE: 592 on 04/15/2022 Pre-treatment weight: 136.1kg  Assessment   Biologics training for omalizumab (Xolair)  Patient was thoroughly counseled on goals of therapy, dosing, side effects, and administration technique at first dose visit.  Patient self-administered Xolair '150mg'$ /mL x 2 syringes in right upper thigh and left upper thigh and Xolair '75mg'$ /0.52m x 1 syringe  in left upper thigh using sample Xolair '150mg'$ /mL pre-filled syringe x 1 syringe NDC: [address]-01 Lot: [id number]: 02/2023  Xolair '150mg'$ /mL pre-filled syringe x 1 syringe NDC: [address]-01 Lot: [id number]: 03/2023   Xolair '75mg'$ /0.56mpre-filled syringe NDC: [id number]: [id number]: 12/2022  Patient monitored for 30 minutes for adverse reaction.  Patient tolerated without issue. Completed injections with confidence. Injection site checked. Patient denies itchiness and irritation.  Medication Reconciliation  A drug regimen assessment was performed, including review of allergies, interactions, disease-state management, dosing and immunization history. Medications were reviewed with the patient, including name, instructions, indication, goals  of therapy, potential side effects, importance of adherence, and safe use.  Drug interaction(s): none noted   PLAN Third Xolair dose will be administered in clinic with 30 minute monitoring period. Scheduled for 07/17/2022. Patient advised that Epipen must be on hand at each visit.  [facility]: [id number]  They will deliver to clinic before appt. Continue maintenance asthma regimen of: Symbicort 160-4.16m316m(2 puffs twice daily),  montelukast '10mg'$  nightly  All questions encouraged and answered.  Instructed patient to reach out with any further questions or concerns.  Thank you for allowing pharmacy to participate in this patient's care.  This appointment required 45 minutes of patient care (this includes precharting, chart review, review of results, face-to-face care, etc.).  DevKnox SalivaharmD, MPH, BCPS, CPP Clinical Pharmacist (Rheumatology and Pulmonology)

## 2022-07-02 ENCOUNTER — Other Ambulatory Visit: Payer: Self-pay | Admitting: Cardiology

## 2022-07-03 ENCOUNTER — Ambulatory Visit: Payer: Medicaid Other | Admitting: Pharmacist

## 2022-07-03 ENCOUNTER — Other Ambulatory Visit (HOSPITAL_COMMUNITY): Payer: Self-pay

## 2022-07-03 DIAGNOSIS — Z7189 Other specified counseling: Secondary | ICD-10-CM

## 2022-07-03 DIAGNOSIS — J4489 Other specified chronic obstructive pulmonary disease: Secondary | ICD-10-CM

## 2022-07-03 MED ORDER — OMALIZUMAB 75 MG/0.5ML ~~LOC~~ SOSY
375.0000 mg | PREFILLED_SYRINGE | SUBCUTANEOUS | 0 refills | Status: DC
  Filled 2022-07-03 – 2022-07-16 (×2): qty 1, 14d supply, fill #0

## 2022-07-03 MED ORDER — OMALIZUMAB 150 MG/ML ~~LOC~~ SOSY
375.0000 mg | PREFILLED_SYRINGE | SUBCUTANEOUS | 0 refills | Status: DC
  Filled 2022-07-03 – 2022-07-16 (×2): qty 4, 14d supply, fill #0

## 2022-07-03 NOTE — Patient Instructions (Signed)
See you on 07/17/2022 @ 10:30am.   Bring your Epipen with you (this is REQUIRED)   There will be 30 minute monitoring period. This will be the last dose we complete together in clinic!

## 2022-07-14 ENCOUNTER — Other Ambulatory Visit: Payer: Self-pay | Admitting: Cardiology

## 2022-07-16 ENCOUNTER — Other Ambulatory Visit (HOSPITAL_COMMUNITY): Payer: Self-pay

## 2022-07-17 ENCOUNTER — Ambulatory Visit: Payer: Medicaid Other | Admitting: Pharmacist

## 2022-07-17 ENCOUNTER — Other Ambulatory Visit (HOSPITAL_COMMUNITY): Payer: Self-pay

## 2022-07-17 DIAGNOSIS — Z7189 Other specified counseling: Secondary | ICD-10-CM

## 2022-07-17 DIAGNOSIS — J4489 Other specified chronic obstructive pulmonary disease: Secondary | ICD-10-CM

## 2022-07-17 MED ORDER — OMALIZUMAB 150 MG/ML ~~LOC~~ SOSY
375.0000 mg | PREFILLED_SYRINGE | SUBCUTANEOUS | 5 refills | Status: DC
  Filled 2022-07-17: qty 4, 28d supply, fill #0
  Filled 2022-08-18: qty 4, 28d supply, fill #1

## 2022-07-17 MED ORDER — OMALIZUMAB 75 MG/0.5ML ~~LOC~~ SOSY
375.0000 mg | PREFILLED_SYRINGE | SUBCUTANEOUS | 5 refills | Status: DC
  Filled 2022-07-17: qty 1, 28d supply, fill #0
  Filled 2022-08-18: qty 1, 28d supply, fill #1

## 2022-07-17 MED ORDER — BUDESONIDE-FORMOTEROL FUMARATE 160-4.5 MCG/ACT IN AERO: 2.0000 | INHALATION_SPRAY | Freq: Two times a day (BID) | RESPIRATORY_TRACT | 1 refills | Status: DC

## 2022-07-17 MED ORDER — MONTELUKAST SODIUM 10 MG PO TABS: 10.0000 mg | ORAL_TABLET | Freq: Every day | ORAL | 1 refills | Status: DC

## 2022-07-17 NOTE — Progress Notes (Signed)
HPI Patient presents today to [facility] to see pharmacy team for third dose of Xolair.   Past medical history includes allergic asthma, asthma-COPD overlap, OSA, CAD, diastolic heart failure, GERD, history of pancreatic cancer   Patient completed their first dose on 06/19/2022 with two hour monitoring period. Patient tolerated first dose without issue and no subsequent issues. Patient completed second dose on 06/30/2022 with 30 minute monitoring period. Patient tolerated second dose without issue. Reports no issues at home either. No symptoms or isgns of anaphylaxis  Epipen on hand and in date: Yes  No major changes to breathing since last visit. Reports that he woke up twice last night due to congestion which took about an hour to clear (but he states that this is usual for him). He states the only possible change he's noticed since starting Xolair is that he is not getting infections with his consistent congestion episodes  Respiratory Medications Current regimen: Symbicort 160-4.59mg (2 puffs twice daily),  montelukast '10mg'$  nightly Patient reports no known adherence challenges  OBJECTIVE No Known Allergies  Outpatient Encounter Medications as of 07/17/2022  Medication Sig   albuterol (ACCUNEB) 0.63 MG/3ML nebulizer solution Take 3 mLs (0.63 mg total) by nebulization every 6 (six) hours as needed for wheezing or shortness of breath.   albuterol (PROVENTIL HFA) 108 (90 Base) MCG/ACT inhaler Inhale 2 puffs into the lungs every 4 (four) hours as needed for wheezing or shortness of breath.   allopurinol (ZYLOPRIM) 100 MG tablet Take 100 mg by mouth daily.   ALPRAZolam (XANAX) 0.25 MG tablet Take 0.25 mg by mouth 3 (three) times daily as needed for anxiety.   aspirin EC 81 MG tablet Take 1 tablet (81 mg total) by mouth daily. Swallow whole.   atorvastatin (LIPITOR) 20 MG tablet TAKE 1 TABLET(20 MG) BY MOUTH DAILY   azelastine (ASTELIN) 0.1 % nasal spray Place 1 spray into both nostrils 2  (two) times daily. Use in each nostril as directed   budesonide-formoterol (SYMBICORT) 160-4.5 MCG/ACT inhaler Inhale 2 puffs into the lungs 2 (two) times daily.   diclofenac Sodium (VOLTAREN) 1 % GEL Apply 1 application topically as needed for pain.   EPINEPHrine (EPIPEN 2-PAK) 0.3 mg/0.3 mL IJ SOAJ injection Inject 0.3 mg into the muscle as needed for anaphylaxis.   fluticasone (FLONASE) 50 MCG/ACT nasal spray Place 1 spray into both nostrils 2 (two) times daily.   HYDROcodone-acetaminophen (NORCO/VICODIN) 5-325 MG tablet Take 1 tablet by mouth as needed for pain.   ibuprofen (ADVIL) 200 MG tablet Take 200 mg by mouth every 6 (six) hours as needed for pain.   metolazone (ZAROXOLYN) 2.5 MG tablet Take 1 tablet (2.5 mg total) by mouth daily.   montelukast (SINGULAIR) 10 MG tablet Take 1 tablet (10 mg total) by mouth at bedtime.   mupirocin ointment (BACTROBAN) 2 % Apply 1 application topically 2 (two) times daily.   naloxone (NARCAN) nasal spray 4 mg/0.1 mL Place 1 spray into the nose as needed for opioid reversal.   nitroGLYCERIN (NITROSTAT) 0.4 MG SL tablet Place 1 tablet (0.4 mg total) under the tongue every 5 (five) minutes as needed for chest pain.   NYSTATIN powder Apply 1 application. topically 2 (two) times daily.   omalizumab (Arvid Right 150 MG/ML prefilled syringe Inject 375 mg into the skin every 14 (fourteen) days. Courier to pulm: [address], [place] [address] Appt on 07/17/2022   omalizumab (Arvid Right 75 MG/0.5ML prefilled syringe Inject 375 mg into the skin  every 14 (fourteen) days. Courier to pulm: [address], [address], [place] [address]. Appt on 07/17/2022   omeprazole (PRILOSEC) 20 MG capsule Take 20 mg by mouth 2 (two) times daily.   pentoxifylline (TRENTAL) 400 MG CR tablet Take 400 mg by mouth 2 (two) times daily.   potassium chloride SA (KLOR-CON M) 20 MEQ tablet Take 2 tablets (40 mEq total) by mouth daily.   Pseudoephedrine-Guaifenesin [phone] MG TB12  Take 1 tablet by mouth in the morning and at bedtime.   tamsulosin (FLOMAX) 0.4 MG CAPS capsule Take 0.4 mg by mouth daily.   tiotropium (SPIRIVA) 18 MCG inhalation capsule Place 1 capsule (18 mcg total) into inhaler and inhale daily.   torsemide (DEMADEX) 10 MG tablet Take 5-10 mg by mouth as needed for edema.   [DISCONTINUED] atorvastatin (LIPITOR) 20 MG tablet TAKE 1 TABLET(20 MG) BY MOUTH DAILY   [DISCONTINUED] nitroGLYCERIN (NITROSTAT) 0.4 MG SL tablet PLACE 1 TABLET UNDER THE TONGUE EVERY 5 MINUTES AS NEEDED FOR CHEST FOR PAIN   No facility-administered encounter medications on file as of 07/17/2022.     Immunization History  Administered Date(s) Administered   Influenza Inj Mdck Quad Pf 02/27/2020   Influenza,inj,Quad PF,6+ Mos 04/09/2017   Influenza,inj,quad, With Preservative 02/26/2019   Moderna Sars-Covid-2 Vaccination 09/02/2019   Respiratory Syncytial Virus Vaccine,Recomb Aduvanted(Arexvy) 04/15/2022     PFTs    11/22/2019   10:43 AM  PFT Results  TLC 5.55         This result is from an external source.    Eosinophils Most recent blood eosinophil count was 200 cells/microL taken on 04/15/2022.    IgE: 592 on 04/15/2022 Pre-treatment weight: 136.1kg  Assessment   Biologics training for omalizumab (Xolair)  Patient was thoroughly counseled on goals of therapy, dosing, side effects, and administration technique at first dose visit.  Patient self-administered Xolair '150mg'$ /mL x 2 syringes in right upper thigh and left upper thigh in and Xolair '75mg'$ /0.28m x 1 syringe  in left upper thigh using sample Xolair '150mg'$ /mL pre-filled syringe x 2 syringes NDC: 5929-793-4085Lot: [id number]: 01/2023   Xolair '75mg'$ /0.568mpre-filled syringe x 1 syringe NDC: [id number]: [id number]: 11/2022  Patient monitored for 30 minutes for adverse reaction.  Patient tolerated without isuse. Patient denies itchiness and irritation.  Medication Reconciliation  A  drug regimen assessment was performed, including review of allergies, interactions, disease-state management, dosing and immunization history. Medications were reviewed with the patient, including name, instructions, indication, goals of therapy, potential side effects, importance of adherence, and safe use.  Drug interaction(s): none noted  PLAN [facility]: [facility]: [id number]  Patient provided with pharmacy phone number and advised that they will call him likely next week to schedule shipment to home for self-administration.  He was called by pharmacy this morning. Expected shipment on 07/24/2022 to his home Continue maintenance asthma regimen of:  Symbicort 160-4.44m19m(2 puffs twice daily),  montelukast '10mg'$  nightly F/u with Dr. [place] Bells 08/24/2022  All questions encouraged and answered.  Instructed patient to reach out with any further questions or concerns.  Thank you for allowing pharmacy to participate in this patient's care.  This appointment required 45 minutes of patient care (this includes precharting, chart review, review of results, face-to-face care, etc.).  DevKnox SalivaharmD, MPH, BCPS, CPP Clinical Pharmacist (Rheumatology and Pulmonology)

## 2022-07-17 NOTE — Addendum Note (Signed)
Addended by: Cassandria Anger on: 07/17/2022 11:37 AM   Modules accepted: Orders

## 2022-07-17 NOTE — Patient Instructions (Signed)
Your next XOLAIR dose is due on 07/31/2022, 08/14/2022, and every 14 days thereafter  CONTINUE Symbicort 160-4.2mg (2 puffs twice daily),  montelukast '10mg'$  nightly  [facility] Their phone number is [phone] [facility] calling you to set up shipment and confirm address. They will mail your medication to your home.  How to manage an injection site reaction: Remember the 5 C's: COUNTER - leave on the counter at least 30 minutes but up to overnight to bring medication to room temperature. This may help prevent stinging COLD - place something cold (like an ice gel pack or cold water bottle) on the injection site just before cleansing with alcohol. This may help reduce pain CLARITIN - use Claritin (generic name is loratadine) for the first two weeks of treatment or the day of, the day before, and the day after injecting. This will help to minimize injection site reactions CORTISONE CREAM - apply if injection site is irritated and itching CALL ME - if injection site reaction is bigger than the size of your fist, looks infected, blisters, or if you develop hives  Anaphylactic Reactions  Anaphylactic reactions can occur up to 24 hours after administration.  What are the signs and symptoms of anaphylaxis?  Symptoms can vary from a mild skin reaction to more severe reactions including: Wheezing, shortness of breath, cough, chest tightness or trouble breathing Low blood pressure, dizziness, fainting, rapid of weak heartbeat, anxiety, or feeling of "impending doom" Flushing, itching, hives, or feeling warm Swelling of the throat or tongue, throat tightness, hoarse voice, or trouble swallowing  Some of these symptoms require immediate treatment, as they can be life threatening.  If you experience severe symptoms which are bolded above use your Epipen and call 911 for immediate emergency care.

## 2022-07-22 ENCOUNTER — Other Ambulatory Visit: Payer: Self-pay

## 2022-08-18 ENCOUNTER — Other Ambulatory Visit (HOSPITAL_COMMUNITY): Payer: Self-pay

## 2022-08-21 ENCOUNTER — Other Ambulatory Visit (HOSPITAL_COMMUNITY): Payer: Self-pay

## 2022-08-24 ENCOUNTER — Ambulatory Visit: Payer: Medicaid Other | Admitting: Pulmonary Disease

## 2022-08-25 ENCOUNTER — Other Ambulatory Visit (HOSPITAL_COMMUNITY): Payer: Self-pay

## 2022-09-02 ENCOUNTER — Ambulatory Visit (INDEPENDENT_AMBULATORY_CARE_PROVIDER_SITE_OTHER): Payer: Medicaid Other | Admitting: Pulmonary Disease

## 2022-09-02 ENCOUNTER — Telehealth: Payer: Self-pay | Admitting: Pharmacist

## 2022-09-02 ENCOUNTER — Encounter: Payer: Self-pay | Admitting: Pulmonary Disease

## 2022-09-02 VITALS — BP 128/74 | HR 90 | Ht 72.0 in | Wt 299.0 lb

## 2022-09-02 DIAGNOSIS — J9612 Chronic respiratory failure with hypercapnia: Secondary | ICD-10-CM

## 2022-09-02 DIAGNOSIS — G4733 Obstructive sleep apnea (adult) (pediatric): Secondary | ICD-10-CM

## 2022-09-02 DIAGNOSIS — J329 Chronic sinusitis, unspecified: Secondary | ICD-10-CM

## 2022-09-02 DIAGNOSIS — J4489 Other specified chronic obstructive pulmonary disease: Secondary | ICD-10-CM | POA: Diagnosis not present

## 2022-09-02 DIAGNOSIS — J9611 Chronic respiratory failure with hypoxia: Secondary | ICD-10-CM

## 2022-09-02 MED ORDER — BUDESONIDE-FORMOTEROL FUMARATE 160-4.5 MCG/ACT IN AERO: 2.0000 | INHALATION_SPRAY | Freq: Two times a day (BID) | RESPIRATORY_TRACT | 1 refills | Status: DC

## 2022-09-02 MED ORDER — TIOTROPIUM BROMIDE MONOHYDRATE 18 MCG IN CAPS: 1.0000 | ORAL_CAPSULE | Freq: Every day | RESPIRATORY_TRACT | 3 refills | Status: DC

## 2022-09-02 MED ORDER — PREDNISONE 10 MG PO TABS: ORAL_TABLET | ORAL | 0 refills | Status: AC

## 2022-09-02 MED ORDER — ALBUTEROL SULFATE 0.63 MG/3ML IN NEBU: 1.0000 | INHALATION_SOLUTION | Freq: Four times a day (QID) | RESPIRATORY_TRACT | 3 refills | Status: DC | PRN

## 2022-09-02 MED ORDER — ALBUTEROL SULFATE HFA 108 (90 BASE) MCG/ACT IN AERS: 2.0000 | INHALATION_SPRAY | RESPIRATORY_TRACT | 1 refills | Status: DC | PRN

## 2022-09-02 NOTE — Addendum Note (Signed)
Addended by: Valerie Salts on: 09/02/2022 11:18 AM   Modules accepted: Orders

## 2022-09-02 NOTE — Patient Instructions (Addendum)
Start steroid taper for inflammation of your sinuses 40mg  daily for 3 days 30mg  daily for 3 days 20mg  daily for 3 days 10mg  daily for 3 days  Continue flonase and azelastine nasal sprays along with sinus rinses  Continue symbicort 2 puffs twice daily - rinse mouth out after each use  Continue Spiriva daily  Continue your CPAP machine when sleeping  We will schedule you with our pharmacy team to transition from [place] to [place] for your asthma-copd and sinus disease.  Follow up in 4 months

## 2022-09-02 NOTE — Progress Notes (Signed)
Synopsis: Referred in March 2024 for Asthma-COPD Overlap Syndrome and OSA previously followed by Dr. Halford Chessman and Dr. Lake Bells.  Subjective:   PATIENT ID: Taylor Vargas GENDER: male DOB: 1961-06-02, MRN: [id number]  HPI  Chief Complaint  Patient presents with   Establish Care    Former VS and BQ patient for asthma-copd. States he has been having a hard time with his allergies over the past few weeks.    Taylor Vargas is a 62 year old male, former smoker with obesity, chronic diastolic heart failure, GERD, OSA on CPAP, pancreatic cancer s/p chemotherapy, chronic sinusitis and asthma-COPD overlap syndrome who returns to pulmonary clinic for follow up.   Patient started Xolair therapy 06/19/2022 and received his second dose on 06/30/2022 and third dose on 07/17/22. He last saw Dr. Lake Bells on 06/02/22.  He is taking montelukast 10mg  daily, fluticasone and azelastine nasal sprays and symbicort 160-4.66mcg 2 puffs twice daily and spiriva handihaler daily.   He has not noticed much improvement in his breathing since starting xolair.   He reports the spring pollen is really bothering his breathing currently and significant sinus congestion. He has fullness in his ears. He has sinus pressure.He has increased wheezing.   IgE level 592 04/15/22.   He has a 62 pack year smoking history and quit smoking in 2018. He is using home vent on iVAPS mode with good compliance based on today's download.   Past Medical History:  Diagnosis Date   Abnormal glucose 08/27/2015   Last Assessment & Plan:  Relevant Hx: Course: Daily Update: Today's Plan:will get an a1c especially given his increased urinary frequency  Electronically signed by: Baldemar Friday, FNP 08/27/15 1513   Arthritis    Benign prostatic hyperplasia with lower urinary tract symptoms 08/27/2015   Last Assessment & Plan:  Relevant Hx: Course: Daily Update: Today's Plan:taking flomax for this, but his was presumed, no workup for this diagnosis   Electronically signed by: Baldemar Friday, [place] 08/27/15 1513   Chest tightness 11/08/2018   Chronic diastolic heart failure ([place]) 04/21/2019   Chronic obstructive pulmonary disease ([place]) 08/27/2015   Last Assessment & Plan:  Relevant Hx: Course: Daily Update: Today's Plan:this is likely in acute exacerbation. I have discussed with him that I would like to et cxr to rule out acute pathology. He finally agrees to this. His sat came up to 89% on RA He understands s/e's of the prednisone and if he does have an element of diabetes or prediabtes this could make that worse for him  Electronically sig   Chronic pain of both knees 01/04/2020   Formatting of this note might be different from the original. Added automatically from request for surgery [id number]   Congenital absence of one kidney    01-04-2018 per pt unsure which one   COPD (chronic obstructive pulmonary disease) ([place])    pulmologist-  dr Alcide Clever [name])   Coronary artery calcification seen on CAT scan 11/30/2019   GERD (gastroesophageal reflux disease)    Hypercholesterolemia 09/10/2015   Hyperglycemia 09/10/2015   Malaise and fatigue 08/27/2015   Last Assessment & Plan:  Relevant Hx: Course: Daily Update: Today's Plan:will get labs to rule out acute pathology   Electronically signed by: Baldemar Friday, [place] 08/27/15 1513   Morbid obesity ([place]) 11/02/2018   On supplemental oxygen therapy    01-04-2018  per pt prescribed as needed ,  states checks oxygen level daily,  and average between 88-90% on room air,  per pt  has not needed O2 past 4 months   OSA on CPAP    Osteoarthritis of right knee 01/11/2020   Pain in left knee 12/28/2017   Pancreatic cancer ([place])    01-04-2018  per pt dx 10/ 2009 found by CT imaging, no surgery,  treatment chemotherapy  @ [facility],  in remission since 2017   Solitary thyroid nodule 09/10/2015     Family History  Problem Relation Age of Onset   Diabetes Mother      Social History   Socioeconomic  History   Marital status: Single    Spouse name: Not on file   Number of children: Not on file   Years of education: Not on file   Highest education level: Not on file  Occupational History   Not on file  Tobacco Use   Smoking status: Former    Packs/day: 2.00    Years: 31.00    Additional pack years: 0.00    Total pack years: 62.00    Types: Cigarettes    Quit date: 2018    Years since quitting: 6.2   Smokeless tobacco: Never  Vaping Use   Vaping Use: Never used  Substance and Sexual Activity   Alcohol use: Not Currently   Drug use: Never   Sexual activity: Not on file  Other Topics Concern   Not on file  Social History Narrative   Not on file   Social Determinants of Health   Financial Resource Strain: Not on file  Food Insecurity: Not on file  Transportation Needs: Not on file  Physical Activity: Not on file  Stress: Not on file  Social Connections: Not on file  Intimate Partner Violence: Not on file     No Known Allergies   Outpatient Medications Prior to Visit  Medication Sig Dispense Refill   albuterol (ACCUNEB) 0.63 MG/3ML nebulizer solution Take 3 mLs (0.63 mg total) by nebulization every 6 (six) hours as needed for wheezing or shortness of breath. 360 mL 3   albuterol (PROVENTIL HFA) 108 (90 Base) MCG/ACT inhaler Inhale 2 puffs into the lungs every 4 (four) hours as needed for wheezing or shortness of breath. 18 g 5   allopurinol (ZYLOPRIM) 100 MG tablet Take 100 mg by mouth daily.     ALPRAZolam (XANAX) 0.25 MG tablet Take 0.25 mg by mouth 3 (three) times daily as needed for anxiety.     aspirin EC 81 MG tablet Take 1 tablet (81 mg total) by mouth daily. Swallow whole. 90 tablet 3   atorvastatin (LIPITOR) 20 MG tablet TAKE 1 TABLET(20 MG) BY MOUTH DAILY 90 tablet 1   azelastine (ASTELIN) 0.1 % nasal spray Place 1 spray into both nostrils 2 (two) times daily. Use in each nostril as directed 90 mL 3   budesonide-formoterol (SYMBICORT) 160-4.5 MCG/ACT inhaler  Inhale 2 puffs into the lungs 2 (two) times daily. 30.6 g 1   diclofenac Sodium (VOLTAREN) 1 % GEL Apply 1 application topically as needed for pain.     EPINEPHrine (EPIPEN 2-PAK) 0.3 mg/0.3 mL IJ SOAJ injection Inject 0.3 mg into the muscle as needed for anaphylaxis. 2 each 5   fluticasone (FLONASE) 50 MCG/ACT nasal spray Place 1 spray into both nostrils 2 (two) times daily. 48 g 3   HYDROcodone-acetaminophen (NORCO/VICODIN) 5-325 MG tablet Take 1 tablet by mouth as needed for pain.     ibuprofen (ADVIL) 200 MG tablet Take 200 mg by mouth every 6 (six) hours as needed for pain.  metolazone (ZAROXOLYN) 2.5 MG tablet Take 1 tablet (2.5 mg total) by mouth daily. 30 tablet 1   montelukast (SINGULAIR) 10 MG tablet Take 1 tablet (10 mg total) by mouth at bedtime. 90 tablet 1   mupirocin ointment (BACTROBAN) 2 % Apply 1 application topically 2 (two) times daily.     naloxone (NARCAN) nasal spray 4 mg/0.1 mL Place 1 spray into the nose as needed for opioid reversal.     nitroGLYCERIN (NITROSTAT) 0.4 MG SL tablet Place 1 tablet (0.4 mg total) under the tongue every 5 (five) minutes as needed for chest pain. 25 tablet 1   NYSTATIN powder Apply 1 application. topically 2 (two) times daily.     omalizumab [place] Right) 150 MG/ML prefilled syringe Inject 375 mg into the skin every 14 (fourteen) days. 4 mL 5   omalizumab (XOLAIR) 75 MG/0.5ML prefilled syringe Inject 375 mg into the skin every 14 (fourteen) days. 1 mL 5   omeprazole (PRILOSEC) 20 MG capsule Take 20 mg by mouth 2 (two) times daily.     pentoxifylline (TRENTAL) 400 MG CR tablet Take 400 mg by mouth 2 (two) times daily.     potassium chloride SA (KLOR-CON M) 20 MEQ tablet Take 2 tablets (40 mEq total) by mouth daily. 180 tablet 0   Pseudoephedrine-Guaifenesin [phone] MG TB12 Take 1 tablet by mouth in the morning and at bedtime. 30 tablet 1   tamsulosin (FLOMAX) 0.4 MG CAPS capsule Take 0.4 mg by mouth daily.     tiotropium (SPIRIVA) 18 MCG  inhalation capsule Place 1 capsule (18 mcg total) into inhaler and inhale daily. 90 capsule 3   torsemide (DEMADEX) 10 MG tablet Take 5-10 mg by mouth as needed for edema.     No facility-administered medications prior to visit.   Review of Systems  Constitutional:  Negative for chills, fever, malaise/fatigue and weight loss.  HENT:  Positive for congestion. Negative for sinus pain and sore throat.   Eyes: Negative.   Respiratory:  Positive for cough, shortness of breath and wheezing. Negative for hemoptysis and sputum production.   Cardiovascular:  Negative for chest pain, palpitations, orthopnea, claudication and leg swelling.  Gastrointestinal:  Negative for abdominal pain, heartburn, nausea and vomiting.  Genitourinary: Negative.   Musculoskeletal:  Negative for joint pain and myalgias.  Skin:  Negative for rash.  Neurological:  Negative for weakness.  Endo/Heme/Allergies:  Positive for environmental allergies.  Psychiatric/Behavioral: Negative.     Objective:   Vitals:   09/02/22 0952  BP: 128/74  Pulse: 90  SpO2: 96%  Weight: 299 lb (135.6 kg)  Height: 6' (1.829 m)    Physical Exam Constitutional:      General: He is not in acute distress.    Appearance: He is obese.  HENT:     Head: Normocephalic and atraumatic.     Nose: Congestion present.  Eyes:     Extraocular Movements: Extraocular movements intact.     Conjunctiva/sclera: Conjunctivae normal.     Pupils: Pupils are equal, round, and reactive to light.  Cardiovascular:     Rate and Rhythm: Normal rate and regular rhythm.     Pulses: Normal pulses.     Heart sounds: Normal heart sounds. No murmur heard. Pulmonary:     Breath sounds: No wheezing, rhonchi or rales.  Abdominal:     General: Bowel sounds are normal.     Palpations: Abdomen is soft.  Musculoskeletal:     Right lower leg: No edema.  Left lower leg: No edema.  Lymphadenopathy:     Cervical: No cervical adenopathy.  Skin:    General: Skin  is warm and dry.  Neurological:     General: No focal deficit present.     Mental Status: He is alert.  Psychiatric:        Mood and Affect: Mood normal.        Behavior: Behavior normal.        Thought Content: Thought content normal.        Judgment: Judgment normal.    CBC    Component Value Date/Time   WBC 14.8 (H) 04/15/2022 1128   RBC 5.14 04/15/2022 1128   HGB 15.9 04/15/2022 1128   HGB 16.9 01/09/2019 1410   HCT 47.5 04/15/2022 1128   HCT 50.8 01/09/2019 1410   PLT 260.0 04/15/2022 1128   PLT 314 01/09/2019 1410   MCV 92.4 04/15/2022 1128   MCV 91 01/09/2019 1410   MCH 29.9 04/23/2020 1145   MCHC 33.5 04/15/2022 1128   RDW 14.0 04/15/2022 1128   RDW 13.5 01/09/2019 1410   LYMPHSABS 3.6 04/15/2022 1128   LYMPHSABS 2.0 11/17/2018 1023   MONOABS 0.9 04/15/2022 1128   EOSABS 0.2 04/15/2022 1128   EOSABS 0.3 11/17/2018 1023   BASOSABS 0.1 04/15/2022 1128   BASOSABS 0.0 11/17/2018 1023      Latest Ref Rng & Units 04/23/2020   11:45 AM 11/30/2019   10:49 AM 04/24/2019    8:14 AM  BMP  Glucose 70 - 99 mg/dL 120  101  112   BUN 8 - 23 mg/dL 24  17  13    Creatinine 0.61 - 1.24 mg/dL 1.69  1.22  1.22   BUN/Creat Ratio 10 - 24  14  11    Sodium 135 - 145 mmol/L 128  137  138   Potassium 3.5 - 5.1 mmol/L 2.9  3.6  3.6   Chloride 98 - 111 mmol/L 87  99  92   CO2 22 - 32 mmol/L 24  28  28    Calcium 8.9 - 10.3 mg/dL 8.4  9.1  9.8    Chest imaging: CT Chest 09/18/20 Mediastinum/Nodes: No enlarged mediastinal or axillary lymph nodes. Thyroid gland, trachea, and esophagus demonstrate no significant findings.   Lungs/Pleura: Centrilobular emphysema and diffuse bronchial wall thickening. Lingular scarring with volume loss. Posterior and posterolateral right lower lobe scarring with volume loss. No superimposed airspace consolidation, atelectasis, or pneumothorax. Right upper lobe pulmonary nodule has resolved in the interval. Previous right apical has also resolved. No new  or suspicious lung nodules identified.  PFT:    11/22/2019   10:43 AM  PFT Results  TLC 5.55         This result is from an external source.   Labs:  Path:  Echo:  Heart Catheterization:   Assessment & Plan:   Asthma-COPD overlap syndrome  OSA on CPAP  Chronic sinusitis, unspecified location - Plan: predniSONE (DELTASONE) 10 MG tablet  Chronic respiratory failure with hypoxia and hypercapnia (HCC)  Discussion: Jobe Criste is a 62 year old male, former smoker with obesity, chronic diastolic heart failure, GERD, OSA on CPAP, pancreatic cancer s/p chemotherapy, chronic sinusitis and asthma-COPD overlap syndrome who returns to pulmonary clinic for follow up.  He is to continue symbicort and spiriva along with flonase and azelastine nasal sprays. He is to continue montelukast daily.   We will transition him from Xolair to [place] for his sinus disease and asthma-COPD overlap syndrome.  He will be scheduled with our pharmacy team.   He is to start a prednisone taper for her sinusitis due to allergies. He is to continue nasal rinses.  He has good compliance with his CPAP based on today's download.   Follow up in 4 months.   Freda Jackson, MD [facility] Office: [phone]    Current Outpatient Medications:    albuterol (ACCUNEB) 0.63 MG/3ML nebulizer solution, Take 3 mLs (0.63 mg total) by nebulization every 6 (six) hours as needed for wheezing or shortness of breath., Disp: 360 mL, Rfl: 3   albuterol (PROVENTIL HFA) 108 (90 Base) MCG/ACT inhaler, Inhale 2 puffs into the lungs every 4 (four) hours as needed for wheezing or shortness of breath., Disp: 18 g, Rfl: 5   allopurinol (ZYLOPRIM) 100 MG tablet, Take 100 mg by mouth daily., Disp: , Rfl:    ALPRAZolam (XANAX) 0.25 MG tablet, Take 0.25 mg by mouth 3 (three) times daily as needed for anxiety., Disp: , Rfl:    aspirin EC 81 MG tablet, Take 1 tablet (81 mg total) by mouth daily. Swallow whole.,  Disp: 90 tablet, Rfl: 3   atorvastatin (LIPITOR) 20 MG tablet, TAKE 1 TABLET(20 MG) BY MOUTH DAILY, Disp: 90 tablet, Rfl: 1   azelastine (ASTELIN) 0.1 % nasal spray, Place 1 spray into both nostrils 2 (two) times daily. Use in each nostril as directed, Disp: 90 mL, Rfl: 3   budesonide-formoterol (SYMBICORT) 160-4.5 MCG/ACT inhaler, Inhale 2 puffs into the lungs 2 (two) times daily., Disp: 30.6 g, Rfl: 1   diclofenac Sodium (VOLTAREN) 1 % GEL, Apply 1 application topically as needed for pain., Disp: , Rfl:    EPINEPHrine (EPIPEN 2-PAK) 0.3 mg/0.3 mL IJ SOAJ injection, Inject 0.3 mg into the muscle as needed for anaphylaxis., Disp: 2 each, Rfl: 5   fluticasone (FLONASE) 50 MCG/ACT nasal spray, Place 1 spray into both nostrils 2 (two) times daily., Disp: 48 g, Rfl: 3   HYDROcodone-acetaminophen (NORCO/VICODIN) 5-325 MG tablet, Take 1 tablet by mouth as needed for pain., Disp: , Rfl:    ibuprofen (ADVIL) 200 MG tablet, Take 200 mg by mouth every 6 (six) hours as needed for pain., Disp: , Rfl:    metolazone (ZAROXOLYN) 2.5 MG tablet, Take 1 tablet (2.5 mg total) by mouth daily., Disp: 30 tablet, Rfl: 1   montelukast (SINGULAIR) 10 MG tablet, Take 1 tablet (10 mg total) by mouth at bedtime., Disp: 90 tablet, Rfl: 1   mupirocin ointment (BACTROBAN) 2 %, Apply 1 application topically 2 (two) times daily., Disp: , Rfl:    naloxone (NARCAN) nasal spray 4 mg/0.1 mL, Place 1 spray into the nose as needed for opioid reversal., Disp: , Rfl:    nitroGLYCERIN (NITROSTAT) 0.4 MG SL tablet, Place 1 tablet (0.4 mg total) under the tongue every 5 (five) minutes as needed for chest pain., Disp: 25 tablet, Rfl: 1   NYSTATIN powder, Apply 1 application. topically 2 (two) times daily., Disp: , Rfl:    omalizumab (XOLAIR) 150 MG/ML prefilled syringe, Inject 375 mg into the skin every 14 (fourteen) days., Disp: 4 mL, Rfl: 5   omalizumab (XOLAIR) 75 MG/0.5ML prefilled syringe, Inject 375 mg into the skin every 14 (fourteen)  days., Disp: 1 mL, Rfl: 5   omeprazole (PRILOSEC) 20 MG capsule, Take 20 mg by mouth 2 (two) times daily., Disp: , Rfl:    pentoxifylline (TRENTAL) 400 MG CR tablet, Take 400 mg by mouth 2 (two) times daily., Disp: ,  Rfl:    potassium chloride SA (KLOR-CON M) 20 MEQ tablet, Take 2 tablets (40 mEq total) by mouth daily., Disp: 180 tablet, Rfl: 0   predniSONE (DELTASONE) 10 MG tablet, Take 4 tablets (40 mg total) by mouth daily with breakfast for 3 days, THEN 3 tablets (30 mg total) daily with breakfast for 3 days, THEN 2 tablets (20 mg total) daily with breakfast for 3 days, THEN 1 tablet (10 mg total) daily with breakfast for 3 days., Disp: 30 tablet, Rfl: 0   Pseudoephedrine-Guaifenesin [phone] MG TB12, Take 1 tablet by mouth in the morning and at bedtime., Disp: 30 tablet, Rfl: 1   tamsulosin (FLOMAX) 0.4 MG CAPS capsule, Take 0.4 mg by mouth daily., Disp: , Rfl:    tiotropium (SPIRIVA) 18 MCG inhalation capsule, Place 1 capsule (18 mcg total) into inhaler and inhale daily., Disp: 90 capsule, Rfl: 3   torsemide (DEMADEX) 10 MG tablet, Take 5-10 mg by mouth as needed for edema., Disp: , Rfl:

## 2022-09-02 NOTE — Telephone Encounter (Signed)
Received notice of new start notification for Dupixent. Submitted a Prior Authorization request to [facility] for [place] via CoverMyMeds. Will update once we receive a response.  Key: Kerman Passey, PharmD, MPH, BCPS, CPP Clinical Pharmacist (Rheumatology and Pulmonology)

## 2022-09-04 ENCOUNTER — Other Ambulatory Visit (HOSPITAL_COMMUNITY): Payer: Self-pay

## 2022-09-04 MED ORDER — DUPIXENT 300 MG/2ML ~~LOC~~ SOAJ
SUBCUTANEOUS | 0 refills | Status: DC
  Filled 2022-09-04: qty 8, 28d supply, fill #0
  Filled 2022-09-04: qty 8, fill #0

## 2022-09-04 NOTE — Telephone Encounter (Signed)
Delivery instructions have been updated in [place], medication will be couriered to [facility] on 09/07/22.  Rx has been processed in [facility] and there is a copay of $4.00.

## 2022-09-04 NOTE — Telephone Encounter (Signed)
Received notification from [facility] regarding a prior authorization for [place]. Authorization has been APPROVED from 09/02/2022 to 03/05/2023. Approval letter sent to scan center.  Per test claim, copay for 28 days supply is $4  Patient can fill through [place]: [phone]   Authorization # [id number]  Pt is scheduled for Dupixent new start on 09/11/22 (his last dose of Xolair was on 08/28/22). Rx sent to [facility] to be courired to clinic by then  Knox Saliva, PharmD, MPH, BCPS, CPP Clinical Pharmacist (Rheumatology and Pulmonology)

## 2022-09-07 ENCOUNTER — Other Ambulatory Visit (HOSPITAL_COMMUNITY): Payer: Self-pay

## 2022-09-07 ENCOUNTER — Other Ambulatory Visit: Payer: Self-pay

## 2022-09-11 ENCOUNTER — Other Ambulatory Visit: Payer: Self-pay

## 2022-09-11 ENCOUNTER — Ambulatory Visit: Payer: Medicaid Other | Admitting: Pharmacist

## 2022-09-11 ENCOUNTER — Other Ambulatory Visit (HOSPITAL_COMMUNITY): Payer: Self-pay

## 2022-09-11 DIAGNOSIS — Z7189 Other specified counseling: Secondary | ICD-10-CM

## 2022-09-11 DIAGNOSIS — J4489 Other specified chronic obstructive pulmonary disease: Secondary | ICD-10-CM

## 2022-09-11 MED ORDER — DUPIXENT 300 MG/2ML ~~LOC~~ SOAJ
300.0000 mg | SUBCUTANEOUS | 1 refills | Status: DC
  Filled 2022-09-11 – 2022-09-29 (×2): qty 12, 84d supply, fill #0
  Filled 2022-09-29: qty 4, 28d supply, fill #0
  Filled 2022-10-27: qty 4, 28d supply, fill #1
  Filled 2022-11-20: qty 4, 28d supply, fill #2
  Filled 2022-12-23 – 2022-12-24 (×2): qty 4, 28d supply, fill #3
  Filled 2023-01-19: qty 4, 28d supply, fill #4
  Filled 2023-02-16: qty 4, 28d supply, fill #5

## 2022-09-11 NOTE — Progress Notes (Addendum)
HPI Patient presents today to [facility] to see pharmacy team for [facility] new start.    He started Xolair on 06/19/2022 but noticed little improvement since then. His last dose of Xolair was on 08/28/22. States this year's allergy seasons is one of his worst - his symptoms have worsened earlier than usual  Respiratory Medications Current regimen: Symbicort 160-4.62mcg (2 puffs twice daily),  montelukast 10mg  nightly  Tried in past: Xolair - minimal response Patient reports no known adherence challenges  OBJECTIVE No Known Allergies  Outpatient Encounter Medications as of 09/11/2022  Medication Sig   albuterol (ACCUNEB) 0.63 MG/3ML nebulizer solution Take 3 mLs (0.63 mg total) by nebulization every 6 (six) hours as needed for wheezing or shortness of breath.   albuterol (PROVENTIL HFA) 108 (90 Base) MCG/ACT inhaler Inhale 2 puffs into the lungs every 4 (four) hours as needed for wheezing or shortness of breath.   allopurinol (ZYLOPRIM) 100 MG tablet Take 100 mg by mouth daily.   ALPRAZolam (XANAX) 0.25 MG tablet Take 0.25 mg by mouth 3 (three) times daily as needed for anxiety.   aspirin EC 81 MG tablet Take 1 tablet (81 mg total) by mouth daily. Swallow whole.   atorvastatin (LIPITOR) 20 MG tablet TAKE 1 TABLET(20 MG) BY MOUTH DAILY   azelastine (ASTELIN) 0.1 % nasal spray Place 1 spray into both nostrils 2 (two) times daily. Use in each nostril as directed   budesonide-formoterol (SYMBICORT) 160-4.5 MCG/ACT inhaler Inhale 2 puffs into the lungs 2 (two) times daily.   diclofenac Sodium (VOLTAREN) 1 % GEL Apply 1 application topically as needed for pain.   Dupilumab (DUPIXENT) 300 MG/2ML SOPN Inject 600mg  into the skin at Day 0 then 300mg  into the skin every 14 days thereafter. Courier to pulm: [address], [place] [address]. Appt on 09/11/22   EPINEPHrine (EPIPEN 2-PAK) 0.3 mg/0.3 mL IJ SOAJ injection Inject 0.3 mg into the muscle as needed for anaphylaxis.    fluticasone (FLONASE) 50 MCG/ACT nasal spray Place 1 spray into both nostrils 2 (two) times daily.   HYDROcodone-acetaminophen (NORCO/VICODIN) 5-325 MG tablet Take 1 tablet by mouth as needed for pain.   ibuprofen (ADVIL) 200 MG tablet Take 200 mg by mouth every 6 (six) hours as needed for pain.   ketorolac (TORADOL) 30 MG/ML injection Inject 1 mL into the muscle 2 (two) times a week.   lidocaine (LIDODERM) 5 % Place 1 patch onto the skin daily.   metolazone (ZAROXOLYN) 2.5 MG tablet Take 1 tablet (2.5 mg total) by mouth daily.   montelukast (SINGULAIR) 10 MG tablet Take 1 tablet (10 mg total) by mouth at bedtime.   mupirocin ointment (BACTROBAN) 2 % Apply 1 application topically 2 (two) times daily.   naloxone (NARCAN) nasal spray 4 mg/0.1 mL Place 1 spray into the nose as needed for opioid reversal.   nitroGLYCERIN (NITROSTAT) 0.4 MG SL tablet Place 1 tablet (0.4 mg total) under the tongue every 5 (five) minutes as needed for chest pain.   NYSTATIN powder Apply 1 application. topically 2 (two) times daily.   omeprazole (PRILOSEC) 20 MG capsule Take 20 mg by mouth 2 (two) times daily.   pentoxifylline (TRENTAL) 400 MG CR tablet Take 400 mg by mouth 2 (two) times daily.   potassium chloride SA (KLOR-CON M) 20 MEQ tablet Take 2 tablets (40 mEq total) by mouth daily.   predniSONE (DELTASONE) 10 MG tablet Take 4 tablets (40 mg total) by mouth daily with breakfast  for 3 days, THEN 3 tablets (30 mg total) daily with breakfast for 3 days, THEN 2 tablets (20 mg total) daily with breakfast for 3 days, THEN 1 tablet (10 mg total) daily with breakfast for 3 days.   Pseudoephedrine-Guaifenesin [phone] MG TB12 Take 1 tablet by mouth in the morning and at bedtime.   tamsulosin (FLOMAX) 0.4 MG CAPS capsule Take 0.4 mg by mouth daily.   tiotropium (SPIRIVA) 18 MCG inhalation capsule Place 1 capsule (18 mcg total) into inhaler and inhale daily.   torsemide (DEMADEX) 10 MG tablet Take 5-10 mg by mouth as needed  for edema.   [DISCONTINUED] atorvastatin (LIPITOR) 20 MG tablet TAKE 1 TABLET(20 MG) BY MOUTH DAILY   [DISCONTINUED] nitroGLYCERIN (NITROSTAT) 0.4 MG SL tablet PLACE 1 TABLET UNDER THE TONGUE EVERY 5 MINUTES AS NEEDED FOR CHEST FOR PAIN   No facility-administered encounter medications on file as of 09/11/2022.     Immunization History  Administered Date(s) Administered   Influenza Inj Mdck Quad Pf 02/27/2020   Influenza,inj,Quad PF,6+ Mos 04/09/2017   Influenza,inj,quad, With Preservative 02/26/2019   Moderna Sars-Covid-2 Vaccination 09/02/2019   Respiratory Syncytial Virus Vaccine,Recomb Aduvanted(Arexvy) 04/15/2022     PFTs    11/22/2019   10:43 AM  PFT Results  TLC 5.55         This result is from an external source.     Eosinophils Most recent blood eosinophil count was 200 cells/microL taken on 04/15/2022.   IgE: 592 on 04/15/2022   Assessment   Biologics training for dupilumab (Dupixent)  Goals of therapy: Mechanism: human monoclonal IgG4 antibody that inhibits interleukin-4 and interleukin-13 cytokine-induced responses, including release of proinflammatory cytokines, chemokines, and IgE Reviewed that Dupixent is add-on medication and patient must continue maintenance inhaler regimen. Response to therapy: may take 4 months to determine efficacy. Discussed that patients generally feel improvement sooner than 4 months.  Side effects: injection site reaction (6-18%), antibody development (5-16%), ophthalmic conjunctivitis (2-16%), transient blood eosinophilia (1-2%)  Dose: 600mg  at Week 0 (administered today in clinic) followed by 300mg  every 14 days thereafter  Administration/Storage:  Reviewed administration sites of thigh or abdomen (at least 2-3 inches away from abdomen). Reviewed the upper arm is only appropriate if caregiver is administering injection  Reviewed storage of medication in refrigerator. Reviewed that [place] can be stored at room temperature in  unopened carton for up to 14 days. Did review possibility of using prefilled syringe if he is unable to hold autoinjector tightly and with stability against skin for injections. He will let us know if he encounters this issue. For now, he plans to hold pen with one hand and use other hand at end of the pen to keep it held against skin  Access: Approval of Dupixent through: insurance  Patient self-administered Dupixent 300mg /11ml x 2 (total dose 600mg ) in right upper thigh and left upper thigh using [facility]-supplied medication: Dupixent 300mg /7mL autoinjector pen NDC: [id number] Lot: [id number] Expiration: 12/31/202  Patient monitored for 30 minutes for adverse reaction.  Patient tolerated without issue. Some discomfort with second injection. Injection site checked and no redness Patient denies itchiness and irritation.  Medication Reconciliation  A drug regimen assessment was performed, including review of allergies, interactions, disease-state management, dosing and immunization history. Medications were reviewed with the patient, including name, instructions, indication, goals of therapy, potential side effects, importance of adherence, and safe use.  Drug interaction(s): none noted  PLAN Continue Dupixent 300mg  every 14 days.  Next dose is due 09/25/22 and every  14 days thereafter. Rx sent to: [facility]: [phone] .  Patient provided with pharmacy phone number and advised that they will call to schedule subsequent shipments to home Continue maintenance asthma regimen of: Symbicort 160-4.36mcg (2 puffs twice daily),  montelukast 10mg  nightly   All questions encouraged and answered.  Instructed patient to reach out with any further questions or concerns.  Thank you for allowing pharmacy to participate in this patient's care.  This appointment required 45 minutes of patient care (this includes precharting, chart review, review of results, face-to-face care,  etc.).  Knox Saliva, PharmD, MPH, BCPS, CPP Clinical Pharmacist (Rheumatology and Pulmonology)

## 2022-09-11 NOTE — Patient Instructions (Signed)
Your next [facility] dose is due on 09/25/22, 10/09/22, and every 14 days thereafter  CONTINUE Symbicort 160-4.2mcg (2 puffs twice daily),  montelukast 10mg  nightly  Your prescription will be shipped from [facility]. Their phone number is [phone] They will call to schedule shipment and confirm address. They will mail your medication to your home.  You will need to be seen by your provider in 3 to 4 months to assess how [place] is working for you. Please ensure you have a follow-up appointment scheduled in July or August 2024. Call our clinic if you need to make this appointment.  How to manage an injection site reaction: Remember the 5 C's: COUNTER - leave on the counter at least 30 minutes but up to overnight to bring medication to room temperature. This may help prevent stinging COLD - place something cold (like an ice gel pack or cold water bottle) on the injection site just before cleansing with alcohol. This may help reduce pain CLARITIN - use Claritin (generic name is loratadine) for the first two weeks of treatment or the day of, the day before, and the day after injecting. This will help to minimize injection site reactions CORTISONE CREAM - apply if injection site is irritated and itching CALL ME - if injection site reaction is bigger than the size of your fist, looks infected, blisters, or if you develop hives

## 2022-09-12 ENCOUNTER — Other Ambulatory Visit: Payer: Self-pay | Admitting: Cardiology

## 2022-09-14 NOTE — Telephone Encounter (Signed)
Rx refill sent to pharmacy. 

## 2022-09-21 ENCOUNTER — Ambulatory Visit: Payer: Medicaid Other | Admitting: Cardiology

## 2022-09-23 ENCOUNTER — Other Ambulatory Visit: Payer: Self-pay | Admitting: Cardiology

## 2022-09-23 NOTE — Telephone Encounter (Signed)
Rx refill sent to pharmacy. 

## 2022-09-23 NOTE — Telephone Encounter (Signed)
Refill to pharmacy, patient has appointment 09/24/22

## 2022-09-24 ENCOUNTER — Encounter: Payer: Self-pay | Admitting: Cardiology

## 2022-09-24 ENCOUNTER — Ambulatory Visit: Payer: Medicaid Other | Attending: Cardiology | Admitting: Cardiology

## 2022-09-24 VITALS — BP 126/78 | HR 106 | Ht 72.0 in | Wt 307.6 lb

## 2022-09-24 DIAGNOSIS — I2510 Atherosclerotic heart disease of native coronary artery without angina pectoris: Secondary | ICD-10-CM

## 2022-09-24 DIAGNOSIS — E7800 Pure hypercholesterolemia, unspecified: Secondary | ICD-10-CM

## 2022-09-24 DIAGNOSIS — J431 Panlobular emphysema: Secondary | ICD-10-CM

## 2022-09-24 DIAGNOSIS — R2243 Localized swelling, mass and lump, lower limb, bilateral: Secondary | ICD-10-CM

## 2022-09-24 DIAGNOSIS — G4733 Obstructive sleep apnea (adult) (pediatric): Secondary | ICD-10-CM | POA: Diagnosis not present

## 2022-09-24 MED ORDER — ASPIRIN 81 MG PO TBEC: 81.0000 mg | DELAYED_RELEASE_TABLET | Freq: Every day | ORAL | 3 refills | Status: DC

## 2022-09-24 MED ORDER — NITROGLYCERIN 0.4 MG SL SUBL: 0.4000 mg | SUBLINGUAL_TABLET | SUBLINGUAL | 11 refills | Status: DC | PRN

## 2022-09-24 MED ORDER — METOLAZONE 2.5 MG PO TABS: 2.5000 mg | ORAL_TABLET | Freq: Every day | ORAL | 3 refills | Status: DC

## 2022-09-24 MED ORDER — ATORVASTATIN CALCIUM 20 MG PO TABS: 20.0000 mg | ORAL_TABLET | Freq: Every day | ORAL | 3 refills | Status: DC

## 2022-09-24 MED ORDER — POTASSIUM CHLORIDE CRYS ER 20 MEQ PO TBCR: 40.0000 meq | EXTENDED_RELEASE_TABLET | Freq: Every day | ORAL | 3 refills | Status: DC

## 2022-09-24 NOTE — Patient Instructions (Signed)
Medication Instructions:  Your physician recommends that you continue on your current medications as directed. Please refer to the Current Medication list given to you today.  *If you need a refill on your cardiac medications before your next appointment, please call your pharmacy*   Lab Work: None ordered If you have labs (blood work) drawn today and your tests are completely normal, you will receive your results only by: MyChart Message (if you have MyChart) OR A paper copy in the mail If you have any lab test that is abnormal or we need to change your treatment, we will call you to review the results.   Testing/Procedures: Your physician has requested that you have a lower arterial duplex. This test is an ultrasound of the arteries in the legs or arms. It looks at arterial blood flow in the legs. Allow one hour for lowerscans. There are no restrictions or special instructions    Follow-Up: At [facility], you and your health needs are our priority.  As part of our continuing mission to provide you with exceptional heart care, we have created designated Provider Care Teams.  These Care Teams include your primary Cardiologist (physician) and Advanced Practice Providers (APPs -  Physician Assistants and Nurse Practitioners) who all work together to provide you with the care you need, when you need it.  We recommend signing up for the patient portal called "MyChart".  Sign up information is provided on this After Visit Summary.  MyChart is used to connect with patients for Virtual Visits (Telemedicine).  Patients are able to view lab/test results, encounter notes, upcoming appointments, etc.  Non-urgent messages can be sent to your provider as well.   To learn more about what you can do with MyChart, go to [email or web address].    Your next appointment:   9 month(s)  The format for your next appointment:   In Person  Provider:   Belva Crome, MD    Other  Instructions none  Important Information About [place]

## 2022-09-24 NOTE — Progress Notes (Signed)
Cardiology Office Note:    Date:  09/24/2022   ID:  Taylor Vargas, DOB 07-09-1960, MRN [id number]  PCP:  Street, Stephanie Coup, MD  Cardiologist:  Garwin Brothers, MD   Referring MD: [address], Stephanie Coup, *    ASSESSMENT:    1. Coronary artery calcification seen on CAT scan   2. Panlobular emphysema   3. OSA on CPAP   4. Morbid obesity   5. Hypercholesterolemia    PLAN:    In order of problems listed above:  Coronary artery calcification: Secondary prevention stressed with the patient.  Importance of compliance with diet medication stressed and vocalized understanding.  He was advised to walk at least to the best of his ability. Morbid obesity: Weight reduction stressed diet emphasized risks of obesity explained. COPD on supplemental oxygen: Managed by primary care. Pedal edema: Predominantly at the foot and ankle right greater than left.  Will schedule him for a DVT study.  No evidence of any arrhythmia or any such issues.  He has had this evaluation done in the past also. Mixed dyslipidemia: On statin therapy managed by primary care.  He is going to get blood work and 2 weeks and send [place] a copy. Patient will be seen in follow-up appointment in 6 months or earlier if the patient has any concerns.    Medication Adjustments/Labs and Tests Ordered: Current medicines are reviewed at length with the patient today.  Concerns regarding medicines are outlined above.  No orders of the defined types were placed in this encounter.  No orders of the defined types were placed in this encounter.    No chief complaint on file.    History of Present Illness:    Taylor Vargas is a 62 y.o. male.  Patient has past medical history of coronary artery calcification, panlobular emphysema on supplemental oxygen, obstructive sleep apnea, morbid obesity and hypercholesterolemia.  He denies any problems at this time and takes care of activities of daily living.  Overall he leads a sedentary lifestyle.   At the time of my evaluation, the patient is alert awake oriented and in no distress.  He gives a history of pedal edema on the right lower extremity blood in the left.  Past Medical History:  Diagnosis Date   Abnormal glucose 08/27/2015   Last Assessment & Plan:  Relevant Hx: Course: Daily Update: Today's Plan:will get an a1c especially given his increased urinary frequency  Electronically signed by: Jenelle Mages, FNP 08/27/15 1513   Arthritis    Benign prostatic hyperplasia with lower urinary tract symptoms 08/27/2015   Last Assessment & Plan:  Relevant Hx: Course: Daily Update: Today's Plan:taking flomax for this, but his was presumed, no workup for this diagnosis  Electronically signed by: Jenelle Mages, FNP 08/27/15 1513   Chest tightness 11/08/2018   Chronic diastolic heart failure 04/21/2019   Chronic obstructive pulmonary disease 08/27/2015   Last Assessment & Plan:  Relevant Hx: Course: Daily Update: Today's Plan:this is likely in acute exacerbation. I have discussed with him that I would like to et cxr to rule out acute pathology. He finally agrees to this. His sat came up to 89% on RA He understands s/e's of the prednisone and if he does have an element of diabetes or prediabtes this could make that worse for him  Electronically sig   Chronic pain of both knees 01/04/2020   Formatting of this note might be different from the original. Added automatically from request for surgery [phone]  Congenital absence of one kidney    01-04-2018 per pt unsure which one   COPD (chronic obstructive pulmonary disease)    pulmologist-  dr Blenda Nicely [name])   Coronary artery calcification seen on CAT scan 11/30/2019   GERD (gastroesophageal reflux disease)    Hypercholesterolemia 09/10/2015   Hyperglycemia 09/10/2015   Malaise and fatigue 08/27/2015   Last Assessment & Plan:  Relevant Hx: Course: Daily Update: Today's Plan:will get labs to rule out acute pathology   Electronically  signed by: Jenelle Mages, FNP 08/27/15 1513   Morbid obesity 11/02/2018   On supplemental oxygen therapy    01-04-2018  per pt prescribed as needed ,  states checks oxygen level daily,  and average between 88-90% on room air,  per pt has not needed O2 past 4 months   OSA on CPAP    Osteoarthritis of right knee 01/11/2020   Pain in left knee 12/28/2017   Pancreatic cancer    01-04-2018  per pt dx 10/ 2009 found by CT imaging, no surgery,  treatment chemotherapy  @ [facility],  in remission since 2017   Solitary thyroid nodule 09/10/2015    Past Surgical History:  Procedure Laterality Date   COLONOSCOPY  last one 2019   KNEE ARTHROSCOPY Right 2002   KNEE ARTHROSCOPY WITH MEDIAL MENISECTOMY Left 01/06/2018   Procedure: LEFT KNEE ARTHROSCOPY WITH MEDIAL MENISECTOMY;  Surgeon: Samson Frederic, MD;  Location: [facility];  Service: Orthopedics;  Laterality: Left;   LEFT HEART CATH AND CORONARY ANGIOGRAPHY N/A 11/21/2018   Procedure: LEFT HEART CATH AND CORONARY ANGIOGRAPHY;  Surgeon: Yvonne Kendall, MD;  Location: [facility];  Service: Cardiovascular;  Laterality: N/A;    Current Medications: Current Meds  Medication Sig   albuterol (ACCUNEB) 0.63 MG/3ML nebulizer solution Take 3 mLs (0.63 mg total) by nebulization every 6 (six) hours as needed for wheezing or shortness of breath.   albuterol (PROVENTIL HFA) 108 (90 Base) MCG/ACT inhaler Inhale 2 puffs into the lungs every 4 (four) hours as needed for wheezing or shortness of breath.   allopurinol (ZYLOPRIM) 100 MG tablet Take 100 mg by mouth daily.   ALPRAZolam (XANAX) 0.25 MG tablet Take 0.25 mg by mouth 3 (three) times daily as needed for anxiety.   atorvastatin (LIPITOR) 20 MG tablet Take 1 tablet (20 mg total) by mouth daily.   azelastine (ASTELIN) 0.1 % nasal spray Place 1 spray into both nostrils 2 (two) times daily. Use in each nostril as directed   budesonide-formoterol (SYMBICORT) 160-4.5 MCG/ACT inhaler  Inhale 2 puffs into the lungs 2 (two) times daily.   diclofenac Sodium (VOLTAREN) 1 % GEL Apply 1 application topically as needed for pain.   Dupilumab (DUPIXENT) 300 MG/2ML SOPN Inject 300 mg into the skin every 14 (fourteen) days.   EPINEPHrine (EPIPEN 2-PAK) 0.3 mg/0.3 mL IJ SOAJ injection Inject 0.3 mg into the muscle as needed for anaphylaxis.   fluticasone (FLONASE) 50 MCG/ACT nasal spray Place 1 spray into both nostrils 2 (two) times daily.   HYDROcodone-acetaminophen (NORCO/VICODIN) 5-325 MG tablet Take 1 tablet by mouth as needed for pain.   ibuprofen (ADVIL) 200 MG tablet Take 200 mg by mouth every 6 (six) hours as needed for pain.   ketorolac (TORADOL) 30 MG/ML injection Inject 1 mL into the muscle 2 (two) times a week.   lidocaine (LIDODERM) 5 % Place 1 patch onto the skin daily.   metolazone (ZAROXOLYN) 2.5 MG tablet Take 1 tablet (2.5 mg total) by  mouth daily.   montelukast (SINGULAIR) 10 MG tablet Take 1 tablet (10 mg total) by mouth at bedtime.   mupirocin ointment (BACTROBAN) 2 % Apply 1 application topically 2 (two) times daily.   naloxone (NARCAN) nasal spray 4 mg/0.1 mL Place 1 spray into the nose as needed for opioid reversal.   nitroGLYCERIN (NITROSTAT) 0.4 MG SL tablet Place 1 tablet (0.4 mg total) under the tongue every 5 (five) minutes as needed for chest pain.   NYSTATIN powder Apply 1 application. topically 2 (two) times daily.   omeprazole (PRILOSEC) 20 MG capsule Take 20 mg by mouth 2 (two) times daily.   pentoxifylline (TRENTAL) 400 MG CR tablet Take 400 mg by mouth 2 (two) times daily.   potassium chloride SA (KLOR-CON M) 20 MEQ tablet Take 2 tablets (40 mEq total) by mouth daily.   Pseudoephedrine-Guaifenesin [phone] MG TB12 Take 1 tablet by mouth in the morning and at bedtime.   tamsulosin (FLOMAX) 0.4 MG CAPS capsule Take 0.4 mg by mouth daily.   tiotropium (SPIRIVA) 18 MCG inhalation capsule Place 1 capsule (18 mcg total) into inhaler and inhale daily.    torsemide (DEMADEX) 10 MG tablet Take 5-10 mg by mouth as needed for edema.   [DISCONTINUED] aspirin EC 81 MG tablet Take 1 tablet (81 mg total) by mouth daily. Swallow whole.     Allergies:   Patient has no known allergies.   Social History   Socioeconomic History   Marital status: Single    Spouse name: Not on file   Number of children: Not on file   Years of education: Not on file   Highest education level: Not on file  Occupational History   Not on file  Tobacco Use   Smoking status: Former    Packs/day: 2.00    Years: 31.00    Additional pack years: 0.00    Total pack years: 62.00    Types: Cigarettes    Quit date: 2018    Years since quitting: 6.2   Smokeless tobacco: Never  Vaping Use   Vaping Use: Never used  Substance and Sexual Activity   Alcohol use: Not Currently   Drug use: Never   Sexual activity: Not on file  Other Topics Concern   Not on file  Social History Narrative   Not on file   Social Determinants of Health   Financial Resource Strain: Not on file  Food Insecurity: Not on file  Transportation Needs: Not on file  Physical Activity: Not on file  Stress: Not on file  Social Connections: Not on file     Family History: The patient's family history includes Diabetes in his mother.  ROS:   Please see the history of present illness.    All other systems reviewed and are negative.  EKGs/Labs/Other Studies Reviewed:    The following studies were reviewed today: EKG with sinus rhythm and nonspecific ST-T changes   Recent Labs: 04/15/2022: Hemoglobin 15.9; Platelets 260.0  Recent Lipid Panel    Component Value Date/Time   CHOL 161 02/27/2020 0944   TRIG 91 02/27/2020 0944   HDL 48 02/27/2020 0944   CHOLHDL 3.4 02/27/2020 0944   LDLCALC 96 02/27/2020 0944    Physical Exam:    VS:  BP 126/78   Pulse (!) 106   Ht 6' (1.829 m)   Wt (!) 307 lb 9.6 oz (139.5 kg)   SpO2 92%   BMI 41.72 kg/m     Wt Readings from Last 3  Encounters:   09/24/22 (!) 307 lb 9.6 oz (139.5 kg)  09/02/22 299 lb (135.6 kg)  06/02/22 300 lb (136.1 kg)     GEN: Patient is in no acute distress HEENT: Normal NECK: No JVD; No carotid bruits LYMPHATICS: No lymphadenopathy CARDIAC: Hear sounds regular, 2/6 systolic murmur at the apex. RESPIRATORY:  Clear to auscultation without rales, wheezing or rhonchi  ABDOMEN: Soft, non-tender, non-distended MUSCULOSKELETAL: Pedal edema: Right greater than left; No deformity  SKIN: Warm and dry NEUROLOGIC:  Alert and oriented x 3 PSYCHIATRIC:  Normal affect   Signed, Garwin Brothers, MD  09/24/2022 10:39 AM    [facility]

## 2022-09-29 ENCOUNTER — Other Ambulatory Visit: Payer: Self-pay

## 2022-09-29 ENCOUNTER — Other Ambulatory Visit (HOSPITAL_COMMUNITY): Payer: Self-pay

## 2022-10-05 ENCOUNTER — Other Ambulatory Visit: Payer: Self-pay

## 2022-10-07 ENCOUNTER — Other Ambulatory Visit (HOSPITAL_COMMUNITY): Payer: Self-pay

## 2022-10-08 ENCOUNTER — Ambulatory Visit: Payer: Medicaid Other | Attending: Cardiology

## 2022-10-08 DIAGNOSIS — R2243 Localized swelling, mass and lump, lower limb, bilateral: Secondary | ICD-10-CM

## 2022-10-08 DIAGNOSIS — R600 Localized edema: Secondary | ICD-10-CM | POA: Diagnosis not present

## 2022-10-12 LAB — LAB REPORT - SCANNED: EGFR: 60

## 2022-10-20 ENCOUNTER — Other Ambulatory Visit (HOSPITAL_COMMUNITY): Payer: Self-pay

## 2022-10-27 ENCOUNTER — Other Ambulatory Visit (HOSPITAL_COMMUNITY): Payer: Self-pay

## 2022-10-28 ENCOUNTER — Other Ambulatory Visit (HOSPITAL_COMMUNITY): Payer: Self-pay

## 2022-11-20 ENCOUNTER — Other Ambulatory Visit: Payer: Self-pay

## 2022-11-25 ENCOUNTER — Other Ambulatory Visit (HOSPITAL_COMMUNITY): Payer: Self-pay

## 2022-12-23 ENCOUNTER — Other Ambulatory Visit (HOSPITAL_COMMUNITY): Payer: Self-pay

## 2022-12-24 ENCOUNTER — Other Ambulatory Visit: Payer: Self-pay

## 2022-12-24 ENCOUNTER — Other Ambulatory Visit (HOSPITAL_COMMUNITY): Payer: Self-pay

## 2023-01-11 ENCOUNTER — Encounter: Payer: Self-pay | Admitting: Pulmonary Disease

## 2023-01-11 ENCOUNTER — Ambulatory Visit (INDEPENDENT_AMBULATORY_CARE_PROVIDER_SITE_OTHER): Payer: Medicaid Other | Admitting: Pulmonary Disease

## 2023-01-11 VITALS — BP 116/74 | HR 94 | Ht 72.0 in | Wt 301.0 lb

## 2023-01-11 DIAGNOSIS — J4489 Other specified chronic obstructive pulmonary disease: Secondary | ICD-10-CM

## 2023-01-11 DIAGNOSIS — Z5181 Encounter for therapeutic drug level monitoring: Secondary | ICD-10-CM

## 2023-01-11 DIAGNOSIS — J329 Chronic sinusitis, unspecified: Secondary | ICD-10-CM | POA: Diagnosis not present

## 2023-01-11 DIAGNOSIS — G4733 Obstructive sleep apnea (adult) (pediatric): Secondary | ICD-10-CM | POA: Diagnosis not present

## 2023-01-11 LAB — CBC WITH DIFFERENTIAL/PLATELET
Basophils Absolute: 0.1 10*3/uL (ref 0.0–0.1)
Basophils Relative: 0.7 % (ref 0.0–3.0)
Eosinophils Absolute: 0.3 10*3/uL (ref 0.0–0.7)
Eosinophils Relative: 1.9 % (ref 0.0–5.0)
HCT: 49.5 % (ref 39.0–52.0)
Hemoglobin: 16 g/dL (ref 13.0–17.0)
Lymphocytes Relative: 23 % (ref 12.0–46.0)
Lymphs Abs: 3.4 10*3/uL (ref 0.7–4.0)
MCHC: 32.3 g/dL (ref 30.0–36.0)
MCV: 94.9 fl (ref 78.0–100.0)
Monocytes Absolute: 1.1 10*3/uL — ABNORMAL HIGH (ref 0.1–1.0)
Monocytes Relative: 7.3 % (ref 3.0–12.0)
Neutro Abs: 10 10*3/uL — ABNORMAL HIGH (ref 1.4–7.7)
Neutrophils Relative %: 67.1 % (ref 43.0–77.0)
Platelets: 316 10*3/uL (ref 150.0–400.0)
RBC: 5.22 Mil/uL (ref 4.22–5.81)
RDW: 14.8 % (ref 11.5–15.5)
WBC: 14.9 10*3/uL — ABNORMAL HIGH (ref 4.0–10.5)

## 2023-01-11 LAB — COMPREHENSIVE METABOLIC PANEL
ALT: 16 U/L (ref 0–53)
AST: 14 U/L (ref 0–37)
Albumin: 4.2 g/dL (ref 3.5–5.2)
Alkaline Phosphatase: 83 U/L (ref 39–117)
BUN: 16 mg/dL (ref 6–23)
CO2: 32 mEq/L (ref 19–32)
Calcium: 10 mg/dL (ref 8.4–10.5)
Chloride: 97 mEq/L (ref 96–112)
Creatinine, Ser: 1.24 mg/dL (ref 0.40–1.50)
GFR: 61.5 mL/min (ref >60.00)
Glucose, Bld: 97 mg/dL (ref 70–99)
Potassium: 4.3 mEq/L (ref 3.5–5.1)
Sodium: 138 mEq/L (ref 135–145)
Total Bilirubin: 0.5 mg/dL (ref 0.2–1.2)
Total Protein: 7.4 g/dL (ref 6.0–8.3)

## 2023-01-11 MED ORDER — BUDESONIDE-FORMOTEROL FUMARATE 160-4.5 MCG/ACT IN AERO
2.0000 | INHALATION_SPRAY | Freq: Two times a day (BID) | RESPIRATORY_TRACT | 3 refills | Status: DC
Start: 2023-01-11 — End: 2023-05-24

## 2023-01-11 MED ORDER — ALBUTEROL SULFATE HFA 108 (90 BASE) MCG/ACT IN AERS
2.0000 | INHALATION_SPRAY | RESPIRATORY_TRACT | 3 refills | Status: DC | PRN
Start: 2023-01-11 — End: 2023-05-24

## 2023-01-11 NOTE — Progress Notes (Signed)
Synopsis: Referred in March 2024 for Asthma-COPD Overlap Syndrome and OSA previously followed by Dr. Craige Cotta and Dr. Kendrick Fries.  Subjective:   PATIENT ID: Taylor Vargas GENDER: male DOB: [birth year], MRN: [id number]  HPI  Chief Complaint  Patient presents with   Follow-up    4 mo f/u for asthma-copd. States his breathing has not improved since last visit. Increased productive cough.    Taylor Vargas is a 62 year old male, former smoker with obesity, chronic diastolic heart failure, GERD, OSA on CPAP, pancreatic cancer s/p chemotherapy, chronic sinusitis and asthma-COPD overlap syndrome who returns to pulmonary clinic for follow up.   Initial OV visit 09/02/22 Patient started Xolair therapy 06/19/2022 and received his second dose on 06/30/2022 and third dose on 07/17/22. He last saw Dr. Kendrick Fries on 06/02/22.  He is taking montelukast 10mg  daily, fluticasone and azelastine nasal sprays and symbicort 160-4.52mcg 2 puffs twice daily and spiriva handihaler daily.   He has not noticed much improvement in his breathing since starting xolair.   He reports the spring pollen is really bothering his breathing currently and significant sinus congestion. He has fullness in his ears. He has sinus pressure.He has increased wheezing.   IgE level 592 04/15/22.   He has a 62 pack year smoking history and quit smoking in 2018. He is using home vent on iVAPS mode with good compliance based on today's download.   Today 01/11/23 He was started on dupixent 09/11/2022.   He reports significant fatigue. He continues to have significant sinus drainage.   Past Medical History:  Diagnosis Date   Abnormal glucose 08/27/2015   Last Assessment & Plan:  Relevant Hx: Course: Daily Update: Today's Plan:will get an a1c especially given his increased urinary frequency  Electronically signed by: Jenelle Mages, FNP 08/27/15 1513   Arthritis    Benign prostatic hyperplasia with lower urinary tract symptoms 08/27/2015   Last  Assessment & Plan:  Relevant Hx: Course: Daily Update: Today's Plan:taking flomax for this, but his was presumed, no workup for this diagnosis  Electronically signed by: Jenelle Mages, FNP 08/27/15 1513   Chest tightness 11/08/2018   Chronic diastolic heart failure (HCC) 04/21/2019   Chronic obstructive pulmonary disease (HCC) 08/27/2015   Last Assessment & Plan:  Relevant Hx: Course: Daily Update: Today's Plan:this is likely in acute exacerbation. I have discussed with him that I would like to et cxr to rule out acute pathology. He finally agrees to this. His sat came up to 89% on RA He understands s/e's of the prednisone and if he does have an element of diabetes or prediabtes this could make that worse for him  Electronically sig   Chronic pain of both knees 01/04/2020   Formatting of this note might be different from the original. Added automatically from request for surgery [id number]   Congenital absence of one kidney    01-04-2018 per pt unsure which one   COPD (chronic obstructive pulmonary disease) (HCC)    pulmologist-  dr Blenda Nicely [name])   Coronary artery calcification seen on CAT scan 11/30/2019   GERD (gastroesophageal reflux disease)    Hypercholesterolemia 09/10/2015   Hyperglycemia 09/10/2015   Malaise and fatigue 08/27/2015   Last Assessment & Plan:  Relevant Hx: Course: Daily Update: Today's Plan:will get labs to rule out acute pathology   Electronically signed by: Jenelle Mages, FNP 08/27/15 1513   Morbid obesity (HCC) 11/02/2018   On supplemental oxygen therapy    01-04-2018  per pt prescribed  as needed ,  states checks oxygen level daily,  and average between 88-90% on room air,  per pt has not needed O2 past 4 months   OSA on CPAP    Osteoarthritis of right knee 01/11/2020   Pain in left knee 12/28/2017   Pancreatic cancer (HCC)    01-04-2018  per pt dx 10/ 2009 found by CT imaging, no surgery,  treatment chemotherapy  @ [facility],  in remission since 2017   Solitary  thyroid nodule 09/10/2015     Family History  Problem Relation Age of Onset   Diabetes Mother      Social History   Socioeconomic History   Marital status: Single    Spouse name: Not on file   Number of children: Not on file   Years of education: Not on file   Highest education level: Not on file  Occupational History   Not on file  Tobacco Use   Smoking status: Former    Current packs/day: 0.00    Average packs/day: 2.0 packs/day for 31.0 years (62.0 ttl pk-yrs)    Types: Cigarettes    Start date: 77    Quit date: 2018    Years since quitting: 6.5   Smokeless tobacco: Never  Vaping Use   Vaping status: Never Used  Substance and Sexual Activity   Alcohol use: Not Currently   Drug use: Never   Sexual activity: Not on file  Other Topics Concern   Not on file  Social History Narrative   Not on file   Social Determinants of Health   Financial Resource Strain: Not on file  Food Insecurity: Not on file  Transportation Needs: Not on file  Physical Activity: Not on file  Stress: Not on file  Social Connections: Not on file  Intimate Partner Violence: Not on file     No Known Allergies   Outpatient Medications Prior to Visit  Medication Sig Dispense Refill   albuterol (ACCUNEB) 0.63 MG/3ML nebulizer solution Take 3 mLs (0.63 mg total) by nebulization every 6 (six) hours as needed for wheezing or shortness of breath. 360 mL 3   allopurinol (ZYLOPRIM) 100 MG tablet Take 100 mg by mouth daily.     ALPRAZolam (XANAX) 0.25 MG tablet Take 0.25 mg by mouth 3 (three) times daily as needed for anxiety.     aspirin EC 81 MG tablet Take 1 tablet (81 mg total) by mouth daily. Swallow whole. 90 tablet 3   atorvastatin (LIPITOR) 20 MG tablet Take 1 tablet (20 mg total) by mouth daily. 90 tablet 3   azelastine (ASTELIN) 0.1 % nasal spray Place 1 spray into both nostrils 2 (two) times daily. Use in each nostril as directed 90 mL 3   diclofenac Sodium (VOLTAREN) 1 % GEL Apply 1  application topically as needed for pain.     Dupilumab (DUPIXENT) 300 MG/2ML SOPN Inject 300 mg into the skin every 14 (fourteen) days. 12 mL 1   EPINEPHrine (EPIPEN 2-PAK) 0.3 mg/0.3 mL IJ SOAJ injection Inject 0.3 mg into the muscle as needed for anaphylaxis. 2 each 5   fluticasone (FLONASE) 50 MCG/ACT nasal spray Place 1 spray into both nostrils 2 (two) times daily. 48 g 3   HYDROcodone-acetaminophen (NORCO/VICODIN) 5-325 MG tablet Take 1 tablet by mouth as needed for pain.     ibuprofen (ADVIL) 200 MG tablet Take 200 mg by mouth every 6 (six) hours as needed for pain.     ketorolac (TORADOL) 30 MG/ML injection Inject  1 mL into the muscle 2 (two) times a week.     lidocaine (LIDODERM) 5 % Place 1 patch onto the skin daily.     metolazone (ZAROXOLYN) 2.5 MG tablet Take 1 tablet (2.5 mg total) by mouth daily. 90 tablet 3   montelukast (SINGULAIR) 10 MG tablet Take 1 tablet (10 mg total) by mouth at bedtime. 90 tablet 1   mupirocin ointment (BACTROBAN) 2 % Apply 1 application topically 2 (two) times daily.     naloxone (NARCAN) nasal spray 4 mg/0.1 mL Place 1 spray into the nose as needed for opioid reversal.     nitroGLYCERIN (NITROSTAT) 0.4 MG SL tablet Place 1 tablet (0.4 mg total) under the tongue every 5 (five) minutes as needed for chest pain. 25 tablet 11   NYSTATIN powder Apply 1 application. topically 2 (two) times daily.     omeprazole (PRILOSEC) 20 MG capsule Take 20 mg by mouth 2 (two) times daily.     pentoxifylline (TRENTAL) 400 MG CR tablet Take 400 mg by mouth 2 (two) times daily.     potassium chloride SA (KLOR-CON M) 20 MEQ tablet Take 2 tablets (40 mEq total) by mouth daily. 180 tablet 3   Pseudoephedrine-Guaifenesin [phone] MG TB12 Take 1 tablet by mouth in the morning and at bedtime. 30 tablet 1   tamsulosin (FLOMAX) 0.4 MG CAPS capsule Take 0.4 mg by mouth daily.     tiotropium (SPIRIVA) 18 MCG inhalation capsule Place 1 capsule (18 mcg total) into inhaler and inhale daily.  90 capsule 3   torsemide (DEMADEX) 10 MG tablet Take 5-10 mg by mouth as needed for edema.     albuterol (PROVENTIL HFA) 108 (90 Base) MCG/ACT inhaler Inhale 2 puffs into the lungs every 4 (four) hours as needed for wheezing or shortness of breath. 54 g 1   budesonide-formoterol (SYMBICORT) 160-4.5 MCG/ACT inhaler Inhale 2 puffs into the lungs 2 (two) times daily. 30.6 g 1   No facility-administered medications prior to visit.   Review of Systems  Constitutional:  Negative for chills, fever, malaise/fatigue and weight loss.  HENT:  Positive for congestion. Negative for sinus pain and sore throat.   Eyes: Negative.   Respiratory:  Positive for cough, shortness of breath and wheezing. Negative for hemoptysis and sputum production.   Cardiovascular:  Negative for chest pain, palpitations, orthopnea, claudication and leg swelling.  Gastrointestinal:  Negative for abdominal pain, heartburn, nausea and vomiting.  Genitourinary: Negative.   Musculoskeletal:  Negative for joint pain and myalgias.  Skin:  Negative for rash.  Neurological:  Negative for weakness.  Endo/Heme/Allergies:  Positive for environmental allergies.  Psychiatric/Behavioral: Negative.     Objective:   Vitals:   01/11/23 1015  BP: 116/74  Pulse: 94  SpO2: 96%  Weight: (!) 301 lb (136.5 kg)  Height: 6' (1.829 m)   Physical Exam Constitutional:      General: He is not in acute distress.    Appearance: He is obese.  HENT:     Head: Normocephalic and atraumatic.     Nose: Congestion present.  Eyes:     Extraocular Movements: Extraocular movements intact.     Conjunctiva/sclera: Conjunctivae normal.     Pupils: Pupils are equal, round, and reactive to light.  Cardiovascular:     Rate and Rhythm: Normal rate and regular rhythm.     Pulses: Normal pulses.     Heart sounds: Normal heart sounds. No murmur heard. Pulmonary:     Breath sounds: No  wheezing, rhonchi or rales.  Abdominal:     General: Bowel sounds are  normal.     Palpations: Abdomen is soft.  Musculoskeletal:     Right lower leg: No edema.     Left lower leg: No edema.  Skin:    General: Skin is warm and dry.  Neurological:     General: No focal deficit present.     Mental Status: He is alert.    CBC    Component Value Date/Time   WBC 14.8 (H) 04/15/2022 1128   RBC 5.14 04/15/2022 1128   HGB 15.9 04/15/2022 1128   HGB 16.9 01/09/2019 1410   HCT 47.5 04/15/2022 1128   HCT 50.8 01/09/2019 1410   PLT 260.0 04/15/2022 1128   PLT 314 01/09/2019 1410   MCV 92.4 04/15/2022 1128   MCV 91 01/09/2019 1410   MCH 29.9 04/23/2020 1145   MCHC 33.5 04/15/2022 1128   RDW 14.0 04/15/2022 1128   RDW 13.5 01/09/2019 1410   LYMPHSABS 3.6 04/15/2022 1128   LYMPHSABS 2.0 11/17/2018 1023   MONOABS 0.9 04/15/2022 1128   EOSABS 0.2 04/15/2022 1128   EOSABS 0.3 11/17/2018 1023   BASOSABS 0.1 04/15/2022 1128   BASOSABS 0.0 11/17/2018 1023      Latest Ref Rng & Units 04/23/2020   11:45 AM 11/30/2019   10:49 AM 04/24/2019    8:14 AM  BMP  Glucose 70 - 99 mg/dL 161  096  045   BUN 8 - 23 mg/dL 24  17  13    Creatinine 0.61 - 1.24 mg/dL 4.09  8.11  9.14   BUN/Creat Ratio 10 - 24  14  11    Sodium 135 - 145 mmol/L 128  137  138   Potassium 3.5 - 5.1 mmol/L 2.9  3.6  3.6   Chloride 98 - 111 mmol/L 87  99  92   CO2 22 - 32 mmol/L 24  28  28    Calcium 8.9 - 10.3 mg/dL 8.4  9.1  9.8    Chest imaging: CT Chest 09/18/20 Mediastinum/Nodes: No enlarged mediastinal or axillary lymph nodes. Thyroid gland, trachea, and esophagus demonstrate no significant findings.   Lungs/Pleura: Centrilobular emphysema and diffuse bronchial wall thickening. Lingular scarring with volume loss. Posterior and posterolateral right lower lobe scarring with volume loss. No superimposed airspace consolidation, atelectasis, or pneumothorax. Right upper lobe pulmonary nodule has resolved in the interval. Previous right apical has also resolved. No new or suspicious  lung nodules identified.  PFT:    11/22/2019   10:43 AM  PFT Results  TLC 5.55         This result is from an external source.   Labs:  Path:  Echo:  Heart Catheterization:   Assessment & Plan:   Asthma-COPD overlap syndrome - Plan: albuterol (PROVENTIL HFA) 108 (90 Base) MCG/ACT inhaler, budesonide-formoterol (SYMBICORT) 160-4.5 MCG/ACT inhaler  OSA on CPAP  Medication monitoring encounter - Plan: CBC with Differential/Platelet, Comp Met (CMET), IgE  Chronic sinusitis, unspecified location  Discussion: Taylor Vargas is a 62 year old male, former smoker with obesity, chronic diastolic heart failure, GERD, OSA on CPAP, pancreatic cancer s/p chemotherapy, chronic sinusitis and asthma-COPD overlap syndrome who returns to pulmonary clinic for follow up.  He is to continue symbicort and spiriva along with flonase and azelastine nasal sprays. He is to continue montelukast daily.   He is to continue Dupixent for his sinus disease and asthma-COPD overlap syndrome.  We will check labs today.  He is  to continue nasal rinses.  Follow up in 4 months.   Melody Comas, MD [facility] Office: [phone]    Current Outpatient Medications:    albuterol (ACCUNEB) 0.63 MG/3ML nebulizer solution, Take 3 mLs (0.63 mg total) by nebulization every 6 (six) hours as needed for wheezing or shortness of breath., Disp: 360 mL, Rfl: 3   allopurinol (ZYLOPRIM) 100 MG tablet, Take 100 mg by mouth daily., Disp: , Rfl:    ALPRAZolam (XANAX) 0.25 MG tablet, Take 0.25 mg by mouth 3 (three) times daily as needed for anxiety., Disp: , Rfl:    aspirin EC 81 MG tablet, Take 1 tablet (81 mg total) by mouth daily. Swallow whole., Disp: 90 tablet, Rfl: 3   atorvastatin (LIPITOR) 20 MG tablet, Take 1 tablet (20 mg total) by mouth daily., Disp: 90 tablet, Rfl: 3   azelastine (ASTELIN) 0.1 % nasal spray, Place 1 spray into both nostrils 2 (two) times daily. Use in each nostril as  directed, Disp: 90 mL, Rfl: 3   diclofenac Sodium (VOLTAREN) 1 % GEL, Apply 1 application topically as needed for pain., Disp: , Rfl:    Dupilumab (DUPIXENT) 300 MG/2ML SOPN, Inject 300 mg into the skin every 14 (fourteen) days., Disp: 12 mL, Rfl: 1   EPINEPHrine (EPIPEN 2-PAK) 0.3 mg/0.3 mL IJ SOAJ injection, Inject 0.3 mg into the muscle as needed for anaphylaxis., Disp: 2 each, Rfl: 5   fluticasone (FLONASE) 50 MCG/ACT nasal spray, Place 1 spray into both nostrils 2 (two) times daily., Disp: 48 g, Rfl: 3   HYDROcodone-acetaminophen (NORCO/VICODIN) 5-325 MG tablet, Take 1 tablet by mouth as needed for pain., Disp: , Rfl:    ibuprofen (ADVIL) 200 MG tablet, Take 200 mg by mouth every 6 (six) hours as needed for pain., Disp: , Rfl:    ketorolac (TORADOL) 30 MG/ML injection, Inject 1 mL into the muscle 2 (two) times a week., Disp: , Rfl:    lidocaine (LIDODERM) 5 %, Place 1 patch onto the skin daily., Disp: , Rfl:    metolazone (ZAROXOLYN) 2.5 MG tablet, Take 1 tablet (2.5 mg total) by mouth daily., Disp: 90 tablet, Rfl: 3   montelukast (SINGULAIR) 10 MG tablet, Take 1 tablet (10 mg total) by mouth at bedtime., Disp: 90 tablet, Rfl: 1   mupirocin ointment (BACTROBAN) 2 %, Apply 1 application topically 2 (two) times daily., Disp: , Rfl:    naloxone (NARCAN) nasal spray 4 mg/0.1 mL, Place 1 spray into the nose as needed for opioid reversal., Disp: , Rfl:    nitroGLYCERIN (NITROSTAT) 0.4 MG SL tablet, Place 1 tablet (0.4 mg total) under the tongue every 5 (five) minutes as needed for chest pain., Disp: 25 tablet, Rfl: 11   NYSTATIN powder, Apply 1 application. topically 2 (two) times daily., Disp: , Rfl:    omeprazole (PRILOSEC) 20 MG capsule, Take 20 mg by mouth 2 (two) times daily., Disp: , Rfl:    pentoxifylline (TRENTAL) 400 MG CR tablet, Take 400 mg by mouth 2 (two) times daily., Disp: , Rfl:    potassium chloride SA (KLOR-CON M) 20 MEQ tablet, Take 2 tablets (40 mEq total) by mouth daily., Disp:  180 tablet, Rfl: 3   Pseudoephedrine-Guaifenesin [phone] MG TB12, Take 1 tablet by mouth in the morning and at bedtime., Disp: 30 tablet, Rfl: 1   tamsulosin (FLOMAX) 0.4 MG CAPS capsule, Take 0.4 mg by mouth daily., Disp: , Rfl:    tiotropium (SPIRIVA) 18 MCG inhalation capsule, Place 1  capsule (18 mcg total) into inhaler and inhale daily., Disp: 90 capsule, Rfl: 3   torsemide (DEMADEX) 10 MG tablet, Take 5-10 mg by mouth as needed for edema., Disp: , Rfl:    albuterol (PROVENTIL HFA) 108 (90 Base) MCG/ACT inhaler, Inhale 2 puffs into the lungs every 4 (four) hours as needed for wheezing or shortness of breath., Disp: 54 g, Rfl: 3   budesonide-formoterol (SYMBICORT) 160-4.5 MCG/ACT inhaler, Inhale 2 puffs into the lungs 2 (two) times daily., Disp: 30.6 g, Rfl: 3

## 2023-01-11 NOTE — Patient Instructions (Addendum)
Continue on dupixent injections as scheduled  Use symbicort or breyna 2 puffs twice daily - rinse mouth out after each use  Continue spiriva inhaler daily  Use albuterol inhaler (Ventolin or Pro-Air) 1-2 puffs every 4-6 hours  We will check lab work today since you have been on dupixent for 4 months.   Follow up in 4 months or sooner if needed

## 2023-01-17 ENCOUNTER — Encounter: Payer: Self-pay | Admitting: Pulmonary Disease

## 2023-01-18 ENCOUNTER — Other Ambulatory Visit (HOSPITAL_COMMUNITY): Payer: Self-pay

## 2023-01-19 ENCOUNTER — Other Ambulatory Visit (HOSPITAL_COMMUNITY): Payer: Self-pay

## 2023-01-22 ENCOUNTER — Other Ambulatory Visit (HOSPITAL_COMMUNITY): Payer: Self-pay

## 2023-02-16 ENCOUNTER — Other Ambulatory Visit (HOSPITAL_COMMUNITY): Payer: Self-pay

## 2023-03-18 ENCOUNTER — Other Ambulatory Visit (HOSPITAL_COMMUNITY): Payer: Self-pay

## 2023-03-18 ENCOUNTER — Other Ambulatory Visit: Payer: Self-pay | Admitting: Pulmonary Disease

## 2023-03-18 ENCOUNTER — Other Ambulatory Visit: Payer: Self-pay | Admitting: Pharmacy Technician

## 2023-03-18 DIAGNOSIS — J4489 Other specified chronic obstructive pulmonary disease: Secondary | ICD-10-CM

## 2023-03-18 NOTE — Progress Notes (Signed)
Specialty Pharmacy Refill Coordination Note  Taylor Vargas is a 62 y.o. male contacted today regarding refills of specialty medication(s) Dupilumab   Patient requested Delivery   Delivery date: 03/23/23   Verified address: [address] [name], [place]   Medication will be filled on 03/22/23.   Refill Request sent to MD. Call if any delays

## 2023-03-19 ENCOUNTER — Other Ambulatory Visit: Payer: Self-pay

## 2023-03-19 ENCOUNTER — Telehealth: Payer: Self-pay | Admitting: Internal Medicine

## 2023-03-19 MED ORDER — DUPIXENT 300 MG/2ML ~~LOC~~ SOAJ
300.0000 mg | SUBCUTANEOUS | 1 refills | Status: DC
  Filled 2023-03-19: qty 4, 28d supply, fill #0
  Filled 2023-04-08: qty 4, 28d supply, fill #1
  Filled 2023-04-27: qty 4, 28d supply, fill #2
  Filled 2023-06-04: qty 4, 28d supply, fill #3
  Filled 2023-07-07: qty 4, 28d supply, fill #4
  Filled 2023-08-04: qty 4, 28d supply, fill #5

## 2023-03-19 NOTE — Telephone Encounter (Signed)
Refill sent for DUPIXENT to [facility]: [phone]   Dose: 300mg  SQ every 14 days  Last OV: 01/11/2023 Provider: Dr. Francine Graven  Next OV: not scheduled but due Dec 2024  Routing to scheduling team for follow-up on appt scheduling  [name], PharmD, MPH, BCPS Clinical Pharmacist (Rheumatology and Pulmonology)

## 2023-03-19 NOTE — Telephone Encounter (Signed)
Good morning Dr. Leonides Schanz,   Supervising Provider 03/19/23 AM  We received a referral from this patient to be scheduled for a colonoscopy. Patient has previous GI history with [facility] and last had a visit with them in 2020. Records from previous visits are in Epic for your review. Patient does report he has pulmonary issues and would like for the procedure to be scheduled in accordance with his pulmonologist. Would you please review and advise on scheduling?  Thank you.

## 2023-03-19 NOTE — Telephone Encounter (Signed)
Please schedule for OV to discuss likely doing a colonoscopy at the hospital for history of colon polyps.  Colonoscopy 09/20/17: Good prep. 4 5-6 mm polyps were removed. Medium internal hemorrhoids. Path: Tubular adenoma x3, HP x1, cecal polyp without clear colonic mucosa.  EGD 09/20/17: Path with mild chronic inflammation that was negative for H pylori.

## 2023-03-22 ENCOUNTER — Other Ambulatory Visit: Payer: Self-pay

## 2023-03-22 NOTE — Progress Notes (Signed)
This medication requires a new prior authorization and is currently being processed by the PA team. Specialty pharmacy team will contact patient with updated shipping date when approved. Patient is aware of delay.

## 2023-03-22 NOTE — Progress Notes (Signed)
Received notification from [facility] that pt needed a new PA.  Submitted an URGENT Prior Authorization request to [facility] MEDICAID for DUPIXENT via CoverMyMeds. Will update once we receive a response.  Key: Audie Pinto

## 2023-03-23 ENCOUNTER — Other Ambulatory Visit: Payer: Self-pay

## 2023-03-23 DIAGNOSIS — R0602 Shortness of breath: Secondary | ICD-10-CM | POA: Diagnosis not present

## 2023-03-23 NOTE — Progress Notes (Signed)
Received notification from [facility] regarding a prior authorization for DUPIXENT. Authorization has been APPROVED from 03/22/2023 to 03/21/2024. Approval letter sent to scan center.  Authorization # [id number]

## 2023-03-23 NOTE — Progress Notes (Signed)
Prior Auth. Approved - left voicemail, mailing medication 10/8 for 10/9 delivery.

## 2023-03-30 ENCOUNTER — Encounter: Payer: Self-pay | Admitting: Internal Medicine

## 2023-03-30 ENCOUNTER — Ambulatory Visit: Payer: Medicaid Other | Admitting: Internal Medicine

## 2023-03-30 VITALS — BP 110/68 | HR 111 | Ht 71.5 in | Wt 302.2 lb

## 2023-03-30 DIAGNOSIS — K219 Gastro-esophageal reflux disease without esophagitis: Secondary | ICD-10-CM

## 2023-03-30 DIAGNOSIS — Z860100 Personal history of colon polyps, unspecified: Secondary | ICD-10-CM | POA: Diagnosis not present

## 2023-03-30 DIAGNOSIS — Z9981 Dependence on supplemental oxygen: Secondary | ICD-10-CM

## 2023-03-30 MED ORDER — NA SULFATE-K SULFATE-MG SULF 17.5-3.13-1.6 GM/177ML PO SOLN: ORAL | 0 refills | Status: AC

## 2023-03-30 NOTE — Progress Notes (Signed)
Chief Complaint: Discuss colonoscopy  HPI : 62 year old male with history of pancreatic cancer s/p chemotherapy, COPD on home oxygen, HFpEF, morbid obesity, and OSA on CPAP presents to discuss a colonoscopy for polyp surveillance  Last colonoscopy was in 2019 with colon polyps with [facility]. He is due for another colonoscopy for polyp surveillance. Currently denies blood in the stools, diarrhea, ab pain, N&V, or dysphagia. For the last 2 months, he has had a little more more constipation since he started on torsemide and has been using Metamucil to help with his constipation. Currently he feels that his bowel habits are regular again. He has lost some fluid weight due to use of torsemide. Endorses some nausea and ab discomfort when he was fluid overloaded, but this has already resolved. He follows with [facility] with Dr. Kendrick Fries for COPD and uses oxygen at night time and with exertion. He has not had an appetite for the last 3 weeks. He does take acid reflux medication to help control his acid reflux. His last EGD was in 2019. Denies family history of colon cancer  Wt Readings from Last 3 Encounters:  03/30/23 (!) 302 lb 4 oz (137.1 kg)  01/11/23 (!) 301 lb (136.5 kg)  09/24/22 (!) 307 lb 9.6 oz (139.5 kg)   Past Medical History:  Diagnosis Date   Abnormal glucose 08/27/2015   Last Assessment & Plan:  Relevant Hx: Course: Daily Update: Today's Plan:will get an a1c especially given his increased urinary frequency  Electronically signed by: Jenelle Mages, FNP 08/27/15 1513   Arthritis    Benign prostatic hyperplasia with lower urinary tract symptoms 08/27/2015   Last Assessment & Plan:  Relevant Hx: Course: Daily Update: Today's Plan:taking flomax for this, but his was presumed, no workup for this diagnosis  Electronically signed by: Jenelle Mages, FNP 08/27/15 1513   Chest tightness 11/08/2018   Chronic diastolic heart failure (HCC) 04/21/2019   Chronic  obstructive pulmonary disease (HCC) 08/27/2015   Last Assessment & Plan:  Relevant Hx: Course: Daily Update: Today's Plan:this is likely in acute exacerbation. I have discussed with him that I would like to et cxr to rule out acute pathology. He finally agrees to this. His sat came up to 89% on RA He understands s/e's of the prednisone and if he does have an element of diabetes or prediabtes this could make that worse for him  Electronically sig   Chronic pain of both knees 01/04/2020   Formatting of this note might be different from the original. Added automatically from request for surgery [id number]   Congenital absence of one kidney    01-04-2018 per pt unsure which one   COPD (chronic obstructive pulmonary disease) (HCC)    pulmologist-  dr Blenda Nicely [name])   Coronary artery calcification seen on CAT scan 11/30/2019   GERD (gastroesophageal reflux disease)    Hypercholesterolemia 09/10/2015   Hyperglycemia 09/10/2015   Malaise and fatigue 08/27/2015   Last Assessment & Plan:  Relevant Hx: Course: Daily Update: Today's Plan:will get labs to rule out acute pathology   Electronically signed by: Jenelle Mages, FNP 08/27/15 1513   Morbid obesity (HCC) 11/02/2018   On supplemental oxygen therapy    01-04-2018  per pt prescribed as needed ,  states checks oxygen level daily,  and average between 88-90% on room air,  per pt has not needed O2 past 4 months   OSA on CPAP    Osteoarthritis of right knee 01/11/2020  Pain in left knee 12/28/2017   Pancreatic cancer (HCC)    01-04-2018  per pt dx 10/ 2009 found by CT imaging, no surgery,  treatment chemotherapy  @ [facility],  in remission since 2017   Solitary thyroid nodule 09/10/2015     Past Surgical History:  Procedure Laterality Date   COLONOSCOPY  last one 2019   ESOPHAGOGASTRODUODENOSCOPY     Said last one done around 2019-2020   KNEE ARTHROSCOPY Right 2002   KNEE ARTHROSCOPY WITH MEDIAL MENISECTOMY Left 01/06/2018   Procedure: LEFT KNEE  ARTHROSCOPY WITH MEDIAL MENISECTOMY;  Surgeon: Samson Frederic, MD;  Location: [facility];  Service: Orthopedics;  Laterality: Left;   LEFT HEART CATH AND CORONARY ANGIOGRAPHY N/A 11/21/2018   Procedure: LEFT HEART CATH AND CORONARY ANGIOGRAPHY;  Surgeon: Yvonne Kendall, MD;  Location: [facility];  Service: Cardiovascular;  Laterality: N/A;   Family History  Problem Relation Age of Onset   Diabetes Mother    Breast cancer Mother    Emphysema Maternal Grandfather    Colon cancer Neg Hx    Esophageal cancer Neg Hx    Social History   Tobacco Use   Smoking status: Former    Current packs/day: 0.00    Average packs/day: 2.0 packs/day for 31.0 years (62.0 ttl pk-yrs)    Types: Cigarettes    Start date: 61    Quit date: 2018    Years since quitting: 6.7   Smokeless tobacco: Never  Vaping Use   Vaping status: Never Used  Substance Use Topics   Alcohol use: Not Currently   Drug use: Never   Current Outpatient Medications  Medication Sig Dispense Refill   albuterol (ACCUNEB) 0.63 MG/3ML nebulizer solution Take 3 mLs (0.63 mg total) by nebulization every 6 (six) hours as needed for wheezing or shortness of breath. 360 mL 3   albuterol (PROVENTIL HFA) 108 (90 Base) MCG/ACT inhaler Inhale 2 puffs into the lungs every 4 (four) hours as needed for wheezing or shortness of breath. 54 g 3   allopurinol (ZYLOPRIM) 100 MG tablet Take 100 mg by mouth daily.     ALPRAZolam (XANAX) 0.25 MG tablet Take 0.25 mg by mouth 3 (three) times daily as needed for anxiety.     aspirin EC 81 MG tablet Take 1 tablet (81 mg total) by mouth daily. Swallow whole. 90 tablet 3   atorvastatin (LIPITOR) 20 MG tablet Take 1 tablet (20 mg total) by mouth daily. 90 tablet 3   azelastine (ASTELIN) 0.1 % nasal spray Place 1 spray into both nostrils 2 (two) times daily. Use in each nostril as directed 90 mL 3   budesonide-formoterol (SYMBICORT) 160-4.5 MCG/ACT inhaler Inhale 2 puffs into the lungs  2 (two) times daily. 30.6 g 3   diclofenac Sodium (VOLTAREN) 1 % GEL Apply 1 application topically as needed for pain.     Dupilumab (DUPIXENT) 300 MG/2ML SOPN Inject 300 mg into the skin every 14 (fourteen) days. 12 mL 1   EPINEPHrine (EPIPEN 2-PAK) 0.3 mg/0.3 mL IJ SOAJ injection Inject 0.3 mg into the muscle as needed for anaphylaxis. 2 each 5   fluticasone (FLONASE) 50 MCG/ACT nasal spray Place 1 spray into both nostrils 2 (two) times daily. 48 g 3   HYDROcodone-acetaminophen (NORCO/VICODIN) 5-325 MG tablet Take 1 tablet by mouth as needed for pain.     ibuprofen (ADVIL) 200 MG tablet Take 200 mg by mouth every 6 (six) hours as needed for pain.  ketorolac (TORADOL) 30 MG/ML injection Inject 1 mL into the muscle 2 (two) times a week.     lidocaine (LIDODERM) 5 % Place 1 patch onto the skin daily.     metolazone (ZAROXOLYN) 2.5 MG tablet Take 1 tablet (2.5 mg total) by mouth daily. 90 tablet 3   montelukast (SINGULAIR) 10 MG tablet Take 1 tablet (10 mg total) by mouth at bedtime. 90 tablet 1   mupirocin ointment (BACTROBAN) 2 % Apply 1 application topically 2 (two) times daily.     Na Sulfate-K Sulfate-Mg Sulf 17.5-3.13-1.6 GM/177ML SOLN Use as directed; may use generic; goodrx card if insurance will not cover generic 354 mL 0   naloxone (NARCAN) nasal spray 4 mg/0.1 mL Place 1 spray into the nose as needed for opioid reversal.     nitroGLYCERIN (NITROSTAT) 0.4 MG SL tablet Place 1 tablet (0.4 mg total) under the tongue every 5 (five) minutes as needed for chest pain. 25 tablet 11   NYSTATIN powder Apply 1 application. topically 2 (two) times daily.     omeprazole (PRILOSEC) 20 MG capsule Take 20 mg by mouth 2 (two) times daily.     pentoxifylline (TRENTAL) 400 MG CR tablet Take 400 mg by mouth 2 (two) times daily.     potassium chloride SA (KLOR-CON M) 20 MEQ tablet Take 2 tablets (40 mEq total) by mouth daily. 180 tablet 3   Pseudoephedrine-Guaifenesin [phone] MG TB12 Take 1 tablet by  mouth in the morning and at bedtime. 30 tablet 1   tamsulosin (FLOMAX) 0.4 MG CAPS capsule Take 0.4 mg by mouth daily.     tiotropium (SPIRIVA) 18 MCG inhalation capsule Place 1 capsule (18 mcg total) into inhaler and inhale daily. 90 capsule 3   torsemide (DEMADEX) 10 MG tablet Take 5-10 mg by mouth as needed for edema.     No current facility-administered medications for this visit.   No Known Allergies   Review of Systems: All systems reviewed and negative except where noted in HPI.   Physical Exam: BP 110/68   Pulse (!) 111   Ht 5' 11.5" (1.816 m)   Wt (!) 302 lb 4 oz (137.1 kg)   BMI 41.57 kg/m  Constitutional: Pleasant,well-developed, male in no acute distress. HEENT: Normocephalic and atraumatic. Conjunctivae are normal. No scleral icterus. Cardiovascular: Normal rate, regular rhythm.  Pulmonary/chest: Effort normal and breath sounds normal. No wheezing, rales or rhonchi. Abdominal: Soft, nondistended, nontender. Bowel sounds active throughout. There are no masses palpable. No hepatomegaly. Extremities: No edema Neurological: Alert and oriented to person place and time. Skin: Skin is warm and dry. No rashes noted. Psychiatric: Normal mood and affect. Behavior is normal.  Labs 12/2022: CBC with elevated WBC of 14.9. CMP nml.   Colonoscopy 09/20/17: Good prep. 4 5-6 mm polyps were removed. Medium internal hemorrhoids. Path: Tubular adenoma x3, HP x1, cecal polyp without clear colonic mucosa.  EGD 09/20/17: Path with mild chronic inflammation that was negative for H pylori.  ASSESSMENT AND PLAN: History of colon polyps GERD Patient presents to discuss getting a colonoscopy for polyp surveillance.  His last colonoscopy was in 2019 with four 5-6 mm polyps and pathology confirmed at least 3 tubular adenomas.  His last EGD in 2019 showed some mild chronic inflammation in the stomach that was negative for H. pylori.  Patient is on home oxygen as a result of his COPD so we will plan  for any GI procedures in the hospital.  I went over doing an EGD  for Barrett's surveillance and a colonoscopy for colon polyp surveillance with the patient, and he is agreeable to proceeding. - EGD/colonoscopy [facility] due to use of home oxygen on 11/11  Eulah Pont, MD  I spent 46 minutes of time, including in depth chart review, independent review of results as outlined above, communicating results with the patient directly, face-to-face time with the patient, coordinating care, ordering studies and medications as appropriate, and documentation.

## 2023-03-30 NOTE — Patient Instructions (Addendum)
You have been scheduled for a colonoscopy. Please follow written instructions given to you at your visit today.   Please pick up your prep supplies at the pharmacy within the next 1-3 days.  If you use inhalers (even only as needed), please bring them with you on the day of your procedure.  DO NOT TAKE 7 DAYS PRIOR TO TEST- Trulicity (dulaglutide) Ozempic, Wegovy (semaglutide) Mounjaro (tirzepatide) Bydureon Bcise (exanatide extended release)  DO NOT TAKE 1 DAY PRIOR TO YOUR TEST Rybelsus (semaglutide) Adlyxin (lixisenatide) Victoza (liraglutide) Byetta (exanatide) ___________________________________________________________________________  We have sent the following medications to your pharmacy for you to pick up at your convenience: Suprep  _______________________________________________________  If your blood pressure at your visit was 140/90 or greater, please contact your primary care physician to follow up on this.  _______________________________________________________  If you are age 40 or older, your body mass index should be between 23-30. Your Body mass index is 41.57 kg/m. If this is out of the aforementioned range listed, please consider follow up with your Primary Care Provider.  If you are age 69 or younger, your body mass index should be between 19-25. Your Body mass index is 41.57 kg/m. If this is out of the aformentioned range listed, please consider follow up with your Primary Care Provider.   ________________________________________________________  The [facility] providers would like to encourage you to use [facility] to communicate with providers for non-urgent requests or questions.  Due to long hold times on the telephone, sending your provider a message by [facility] may be a faster and more efficient way to get a response.  Please allow 48 business hours for a response.  Please remember that this is for non-urgent requests.   _______________________________________________________  Due to recent changes in healthcare laws, you may see the results of your imaging and laboratory studies on MyChart before your provider has had a chance to review them.  We understand that in some cases there may be results that are confusing or concerning to you. Not all laboratory results come back in the same time frame and the provider may be waiting for multiple results in order to interpret others.  Please give [place] 48 hours in order for your provider to thoroughly review all the results before contacting the office for clarification of your results.   Thank you for entrusting me with your care and for choosing [facility], Dr. Eulah Pont

## 2023-04-06 ENCOUNTER — Telehealth: Payer: Self-pay | Admitting: Pulmonary Disease

## 2023-04-06 MED ORDER — TIOTROPIUM BROMIDE MONOHYDRATE 18 MCG IN CAPS: 1.0000 | ORAL_CAPSULE | Freq: Every day | RESPIRATORY_TRACT | 2 refills | Status: DC

## 2023-04-06 NOTE — Telephone Encounter (Signed)
PT is a former PT of Dr. Craige Cotta and now see's Dr. Francine Graven. He wants is to change his RX's to Dr. Cindi Carbon name and make them all 90 day supplies. Right now he is out of Spiriva. I think Pharm has sent a fax already.    Pharm is [facility] on [address] In [name]

## 2023-04-06 NOTE — Telephone Encounter (Signed)
Lm for patient. Which Rx are needed?

## 2023-04-06 NOTE — Telephone Encounter (Signed)
90 day supply of spiriva has been sent to preferred pharmacy.  Patient is aware and voiced his understanding.  Nothing further needed.

## 2023-04-06 NOTE — Telephone Encounter (Signed)
Just the Spiriva but update Dr's name on all others from [facility]. Thanks,

## 2023-04-08 ENCOUNTER — Other Ambulatory Visit: Payer: Self-pay

## 2023-04-08 NOTE — Progress Notes (Signed)
Specialty Pharmacy Refill Coordination Note  Taylor Vargas is a 62 y.o. male contacted today regarding refills of specialty medication(s) Dupilumab  Patient has 1 dose on hand for this week.   Patient requested Delivery   Delivery date: 04/15/23   Verified address: [address]   Medication will be filled on 04/14/23.

## 2023-04-16 ENCOUNTER — Encounter (HOSPITAL_COMMUNITY): Payer: Self-pay | Admitting: Internal Medicine

## 2023-04-16 ENCOUNTER — Telehealth: Payer: Self-pay

## 2023-04-16 NOTE — Progress Notes (Signed)
Pre op call eval Name:Joquan Alanis  PCP-Christopher Street MD Cardiologist-Revankar MD PulmonogistFrancine Graven MD   EKG-09/12/21 Echo-11/25/18 Cath-11/21/18 Stress-n/a ICD/PM-n/a Blood thinner-n/a GLP-1- n/a   Hx:CAD, COPD, born with one kidney, Pnacreatic cancer. Has been done prior in the office but with pt being on 02 they switched him to hospital setting. Last visit with cardiology 09/24/22 and says no current cardiac issues. Last visit with pulm 01/11/23 and recommended f.u 4 months, has appt 12/1. He does endorse his breathing has gotten worse since last saw pulm. He said that it differs from day to day but some days of recent needing to be on a noninvasive ventillator most of the day. Anesthesia Review- Yes

## 2023-04-16 NOTE — Telephone Encounter (Signed)
  Dear Dr. Francine Graven  We have a mutual patient that is having a colonoscopy on 04/26/23       . He is under your care for his pulmonary needs and we are seeking a pulmonologist clearance. Can you please state if this patient is cleared from a pulmonologist standpoint to undergo a colonoscopy or not.  Please respond as soon as you can. Thank you for your time.

## 2023-04-17 NOTE — Telephone Encounter (Signed)
ARISCAT Score for Postoperative Pulmonary Complications Predicts risk of pulmonary complications after surgery, including respiratory failure.  Low risk 1.6% risk of in-hospital post-op pulmonary complications (composite including respiratory failure, respiratory infection, pleural effusion, atelectasis, pneumothorax, bronchospasm, aspiration pneumonitis)  Recommend having CPAP or Bipap available in the post-sedation recovery area as he has history of OSA.   Dr. Francine Graven

## 2023-04-26 ENCOUNTER — Ambulatory Visit (HOSPITAL_COMMUNITY): Payer: Medicaid Other | Admitting: Anesthesiology

## 2023-04-26 ENCOUNTER — Other Ambulatory Visit: Payer: Self-pay

## 2023-04-26 ENCOUNTER — Encounter (HOSPITAL_COMMUNITY)
Admission: RE | Disposition: A | Discharge status: Home / Self Care | Payer: Self-pay | Source: Home / Self Care | Attending: Internal Medicine

## 2023-04-26 ENCOUNTER — Ambulatory Visit (HOSPITAL_COMMUNITY)
Admission: RE | Admit: 2023-04-26 | Discharge: 2023-04-26 | Disposition: A | Discharge status: Home / Self Care | Payer: Medicaid Other | Attending: Internal Medicine | Admitting: Internal Medicine

## 2023-04-26 DIAGNOSIS — K219 Gastro-esophageal reflux disease without esophagitis: Secondary | ICD-10-CM | POA: Insufficient documentation

## 2023-04-26 DIAGNOSIS — D123 Benign neoplasm of transverse colon: Secondary | ICD-10-CM | POA: Insufficient documentation

## 2023-04-26 DIAGNOSIS — K317 Polyp of stomach and duodenum: Secondary | ICD-10-CM

## 2023-04-26 DIAGNOSIS — Z1211 Encounter for screening for malignant neoplasm of colon: Secondary | ICD-10-CM | POA: Insufficient documentation

## 2023-04-26 DIAGNOSIS — K31819 Angiodysplasia of stomach and duodenum without bleeding: Secondary | ICD-10-CM | POA: Diagnosis not present

## 2023-04-26 DIAGNOSIS — Z87891 Personal history of nicotine dependence: Secondary | ICD-10-CM | POA: Insufficient documentation

## 2023-04-26 DIAGNOSIS — J449 Chronic obstructive pulmonary disease, unspecified: Secondary | ICD-10-CM | POA: Diagnosis not present

## 2023-04-26 DIAGNOSIS — M1990 Unspecified osteoarthritis, unspecified site: Secondary | ICD-10-CM | POA: Diagnosis not present

## 2023-04-26 DIAGNOSIS — D122 Benign neoplasm of ascending colon: Secondary | ICD-10-CM | POA: Insufficient documentation

## 2023-04-26 DIAGNOSIS — K635 Polyp of colon: Secondary | ICD-10-CM | POA: Insufficient documentation

## 2023-04-26 DIAGNOSIS — K648 Other hemorrhoids: Secondary | ICD-10-CM | POA: Diagnosis not present

## 2023-04-26 DIAGNOSIS — I2510 Atherosclerotic heart disease of native coronary artery without angina pectoris: Secondary | ICD-10-CM | POA: Diagnosis not present

## 2023-04-26 DIAGNOSIS — I5032 Chronic diastolic (congestive) heart failure: Secondary | ICD-10-CM | POA: Diagnosis not present

## 2023-04-26 DIAGNOSIS — D126 Benign neoplasm of colon, unspecified: Secondary | ICD-10-CM

## 2023-04-26 DIAGNOSIS — Z860100 Personal history of colon polyps, unspecified: Secondary | ICD-10-CM

## 2023-04-26 DIAGNOSIS — G4730 Sleep apnea, unspecified: Secondary | ICD-10-CM | POA: Insufficient documentation

## 2023-04-26 DIAGNOSIS — K633 Ulcer of intestine: Secondary | ICD-10-CM

## 2023-04-26 HISTORY — DX: Personal history of colon polyps, unspecified: Z86.0100

## 2023-04-26 HISTORY — PX: COLONOSCOPY WITH PROPOFOL: SHX5780

## 2023-04-26 HISTORY — PX: ESOPHAGOGASTRODUODENOSCOPY (EGD) WITH PROPOFOL: SHX5813

## 2023-04-26 HISTORY — PX: POLYPECTOMY: SHX5525

## 2023-04-26 HISTORY — PX: BIOPSY: SHX5522

## 2023-04-26 SURGERY — COLONOSCOPY WITH PROPOFOL
Anesthesia: Monitor Anesthesia Care

## 2023-04-26 MED ORDER — SODIUM CHLORIDE 0.9 % IV SOLN: INTRAVENOUS | Status: DC

## 2023-04-26 MED ORDER — LIDOCAINE 2% (20 MG/ML) 5 ML SYRINGE
INTRAMUSCULAR | Status: DC | PRN
  Administered 2023-04-26: 80 mg via INTRAVENOUS

## 2023-04-26 MED ORDER — IPRATROPIUM-ALBUTEROL 0.5-2.5 (3) MG/3ML IN SOLN
RESPIRATORY_TRACT | Status: AC
  Filled 2023-04-26: qty 3

## 2023-04-26 MED ORDER — PROPOFOL 10 MG/ML IV BOLUS
INTRAVENOUS | Status: DC | PRN
  Administered 2023-04-26: 20 mg via INTRAVENOUS

## 2023-04-26 MED ORDER — PROPOFOL 500 MG/50ML IV EMUL
INTRAVENOUS | Status: DC | PRN
  Administered 2023-04-26: 125 ug/kg/min via INTRAVENOUS

## 2023-04-26 MED ORDER — IPRATROPIUM-ALBUTEROL 0.5-2.5 (3) MG/3ML IN SOLN
3.0000 mL | Freq: Once | RESPIRATORY_TRACT | Status: AC
  Administered 2023-04-26: 3 mL via RESPIRATORY_TRACT

## 2023-04-26 MED ORDER — PROPOFOL 1000 MG/100ML IV EMUL
INTRAVENOUS | Status: AC
Start: 2023-04-26 — End: ?
  Filled 2023-04-26: qty 100

## 2023-04-26 SURGICAL SUPPLY — 25 items
BLOCK BITE 60FR ADLT L/F BLUE (MISCELLANEOUS) ×2 IMPLANT
ELECT REM PT RETURN 9FT ADLT (ELECTROSURGICAL)
ELECTRODE REM PT RTRN 9FT ADLT (ELECTROSURGICAL) IMPLANT
FCP BXJMBJMB 240X2.8X (CUTTING FORCEPS)
FLOOR PAD 36X40 (MISCELLANEOUS) ×2
FORCEP RJ3 GP 1.8X160 W-NEEDLE (CUTTING FORCEPS) IMPLANT
FORCEPS BIOP RAD 4 LRG CAP 4 (CUTTING FORCEPS) IMPLANT
FORCEPS BIOP RJ4 240 W/NDL (CUTTING FORCEPS)
FORCEPS BXJMBJMB 240X2.8X (CUTTING FORCEPS) IMPLANT
INJECTOR/SNARE I SNARE (MISCELLANEOUS) IMPLANT
LUBRICANT JELLY 4.5OZ STERILE (MISCELLANEOUS) IMPLANT
MANIFOLD NEPTUNE II (INSTRUMENTS) IMPLANT
NDL SCLEROTHERAPY 25GX240 (NEEDLE) IMPLANT
NEEDLE SCLEROTHERAPY 25GX240 (NEEDLE)
PAD FLOOR 36X40 (MISCELLANEOUS) ×2 IMPLANT
PROBE APC STR FIRE (PROBE) IMPLANT
PROBE INJECTION GOLD (MISCELLANEOUS)
PROBE INJECTION GOLD 7FR (MISCELLANEOUS) IMPLANT
SNARE ROTATE MED OVAL 20MM (MISCELLANEOUS) IMPLANT
SNARE SHORT THROW 13M SML OVAL (MISCELLANEOUS) IMPLANT
SYR 50ML LL SCALE MARK (SYRINGE) IMPLANT
TRAP SPECIMEN MUCOUS 40CC (MISCELLANEOUS) IMPLANT
TUBING ENDO SMARTCAP PENTAX (MISCELLANEOUS) ×4 IMPLANT
TUBING IRRIGATION ENDOGATOR (MISCELLANEOUS) ×2 IMPLANT
WATER STERILE IRR 1000ML POUR (IV SOLUTION) IMPLANT

## 2023-04-26 NOTE — Anesthesia Postprocedure Evaluation (Signed)
Anesthesia Post Note  Patient: Mar[place] Setting  Procedure(s) Performed: COLONOSCOPY WITH PROPOFOL ESOPHAGOGASTRODUODENOSCOPY (EGD) WITH PROPOFOL BIOPSY POLYPECTOMY     Patient location during evaluation: PACU Anesthesia Type: MAC Level of consciousness: awake and alert Pain management: pain level controlled Vital Signs Assessment: post-procedure vital signs reviewed and stable Respiratory status: spontaneous breathing, nonlabored ventilation, respiratory function stable and patient connected to nasal cannula oxygen Cardiovascular status: stable and blood pressure returned to baseline Postop Assessment: no apparent nausea or vomiting Anesthetic complications: no   No notable events documented.  Last Vitals:  Vitals:   04/26/23 1100 04/26/23 1110  BP: 95/60 104/69  Pulse: 86 85  Resp: 14 14  Temp:    SpO2: 92% 98%    Last Pain:  Vitals:   04/26/23 1110  TempSrc:   PainSc: 0-No pain                 Mariann Barter

## 2023-04-26 NOTE — Anesthesia Preprocedure Evaluation (Signed)
Anesthesia Evaluation  Patient identified by MRN, date of birth, ID band Patient awake    Reviewed: Allergy & Precautions, NPO status , Patient's Chart, lab work & pertinent test results, reviewed documented beta blocker date and time   Airway Mallampati: III  TM Distance: >3 FB Neck ROM: Limited    Dental no notable dental hx.    Pulmonary sleep apnea and Continuous Positive Airway Pressure Ventilation , COPD, former smoker    + decreased breath sounds  rales    Cardiovascular (-) hypertension+ CAD (coronary calcifications - no LHC on file) and +CHF  (-) Past MI, (-) Cardiac Stents and (-) CABG  Rhythm:Regular Rate:Normal     Neuro/Psych neg Seizures    GI/Hepatic ,GERD  ,,(+) neg Cirrhosis        Endo/Other    Renal/GU      Musculoskeletal  (+) Arthritis ,    Abdominal   Peds  Hematology   Anesthesia Other Findings   Reproductive/Obstetrics                              Anesthesia Physical Anesthesia Plan  ASA: 3  Anesthesia Plan: MAC   Post-op Pain Management:    Induction: Intravenous  PONV Risk Score and Plan: 1 and Ondansetron and Propofol infusion  Airway Management Planned: Natural Airway  Additional Equipment:   Intra-op Plan:   Post-operative Plan:   Informed Consent: I have reviewed the patients History and Physical, chart, labs and discussed the procedure including the risks, benefits and alternatives for the proposed anesthesia with the patient or authorized representative who has indicated his/her understanding and acceptance.     Dental advisory given  Plan Discussed with: CRNA  Anesthesia Plan Comments:          Anesthesia Quick Evaluation

## 2023-04-26 NOTE — Op Note (Signed)
[facility] Patient Name: Taylor Vargas Procedure Date: 04/26/2023 MRN: [id number] Attending MD: Particia Lather , , [id number] Date of Birth: 1961/06/06 CSN: [id number] Age: 62 Admit Type: Outpatient Procedure:                Colonoscopy Indications:              High risk colon cancer surveillance: Personal                            history of colonic polyps Providers:                Madelyn Brunner" Jesus Genera, RN, Beryle Beams, Technician, [facility], CRNA Referring MD:             Madelyn Brunner" Jamison Neighbor. Street Medicines:                Monitored Anesthesia Care Complications:            No immediate complications. Estimated Blood Loss:     Estimated blood loss was minimal. Procedure:                Pre-Anesthesia Assessment:                           - Prior to the procedure, a History and Physical                            was performed, and patient medications and                            allergies were reviewed. The patient's tolerance of                            previous anesthesia was also reviewed. The risks                            and benefits of the procedure and the sedation                            options and risks were discussed with the patient.                            All questions were answered, and informed consent                            was obtained. Prior Anticoagulants: The patient has                            taken no anticoagulant or antiplatelet agents. ASA                            Grade Assessment: III - A patient with severe  systemic disease. After reviewing the risks and                            benefits, the patient was deemed in satisfactory                            condition to undergo the procedure.                           After obtaining informed consent, the colonoscope                            was passed under  direct vision. Throughout the                            procedure, the patient's blood pressure, pulse, and                            oxygen saturations were monitored continuously. The                            CF-HQ190L ([id number]) Olympus colonoscope was                            introduced through the anus and advanced to the the                            cecum, identified by appendiceal orifice and                            ileocecal valve. The colonoscopy was performed                            without difficulty. The patient tolerated the                            procedure well. The quality of the bowel                            preparation was good. The ileocecal valve,                            appendiceal orifice, and rectum were photographed. Scope In: 10:05:26 AM Scope Out: 10:34:08 AM Scope Withdrawal Time: 0 hours 20 minutes 20 seconds  Total Procedure Duration: 0 hours 28 minutes 42 seconds  Findings:      A 10 mm polyp was found in the ascending colon. The polyp was sessile.       The polyp was removed with a hot snare. Resection and retrieval were       complete.      Two localized non-bleeding erosions were found in the ascending colon.       No stigmata of recent bleeding were seen. Biopsies were taken with a       cold forceps for histology.      Three sessile polyps were found  in the transverse colon. The polyps were       3 to 6 mm in size. These polyps were removed with a cold snare.       Resection and retrieval were complete.      A 3 mm polyp was found in the sigmoid colon. The polyp was sessile. The       polyp was removed with a cold snare. Resection and retrieval were       complete.      Non-bleeding internal hemorrhoids were found during retroflexion. Impression:               - One 10 mm polyp in the ascending colon, removed                            with a hot snare. Resected and retrieved.                           - Three 3 to 6 mm polyps in  the transverse colon,                            removed with a cold snare. Resected and retrieved.                           - One 3 mm polyp in the sigmoid colon, removed with                            a cold snare. Resected and retrieved.                           - Non-bleeding internal hemorrhoids. Moderate Sedation:      Not Applicable - Patient had care per Anesthesia. Recommendation:           - Discharge patient to home (with escort).                           - Await pathology results.                           - The findings and recommendations were discussed                            with the patient. Procedure Code(s):        --- Professional ---                           [phone], Colonoscopy, flexible; with removal of                            tumor(s), polyp(s), or other lesion(s) by snare                            technique Diagnosis Code(s):        --- Professional ---                           Z86.010, Personal history of  colonic polyps                           D12.2, Benign neoplasm of ascending colon                           D12.5, Benign neoplasm of sigmoid colon                           D12.3, Benign neoplasm of transverse colon (hepatic                            flexure or splenic flexure)                           K64.8, Other hemorrhoids CPT copyright 2022 American Medical Association. All rights reserved. The codes documented in this report are preliminary and upon coder review may  be revised to meet current compliance requirements. Dr Particia Lather "Taylor Vargas" Taylor Vargas,  04/26/2023 10:45:19 AM Number of Addenda: 0

## 2023-04-26 NOTE — Discharge Instructions (Addendum)

## 2023-04-26 NOTE — H&P (Signed)
GASTROENTEROLOGY PROCEDURE H&P NOTE   Primary Care Physician: Street, Stephanie Coup, MD    Reason for Procedure:   History of colon polyps, GERD  Plan:    EGD/colonoscopy  Patient is appropriate for endoscopic procedure(s) in the hospital setting.  The nature of the procedure, as well as the risks, benefits, and alternatives were carefully and thoroughly reviewed with the patient. Ample time for discussion and questions allowed. The patient understood, was satisfied, and agreed to proceed.     HPI: Shamel Abidi is a 62 y.o. male who presents for EGD/colonoscopy for evaluation of history of colon polyps and GERD .  Patient was most recently seen in the Gastroenterology Clinic on 03/30/23.  No interval change in medical history since that appointment. Please refer to that note for full details regarding GI history and clinical presentation.   Past Medical History:  Diagnosis Date   Abnormal glucose 08/27/2015   Last Assessment & Plan:  Relevant Hx: Course: Daily Update: Today's Plan:will get an a1c especially given his increased urinary frequency  Electronically signed by: Jenelle Mages, FNP 08/27/15 1513   Arthritis    Benign prostatic hyperplasia with lower urinary tract symptoms 08/27/2015   Last Assessment & Plan:  Relevant Hx: Course: Daily Update: Today's Plan:taking flomax for this, but his was presumed, no workup for this diagnosis  Electronically signed by: Jenelle Mages, FNP 08/27/15 1513   Chest tightness 11/08/2018   Chronic diastolic heart failure (HCC) 04/21/2019   Chronic obstructive pulmonary disease (HCC) 08/27/2015   Last Assessment & Plan:  Relevant Hx: Course: Daily Update: Today's Plan:this is likely in acute exacerbation. I have discussed with him that I would like to et cxr to rule out acute pathology. He finally agrees to this. His sat came up to 89% on RA He understands s/e's of the prednisone and if he does have an element of diabetes or  prediabtes this could make that worse for him  Electronically sig   Chronic pain of both knees 01/04/2020   Formatting of this note might be different from the original. Added automatically from request for surgery [id number]   Congenital absence of one kidney    01-04-2018 per pt unsure which one   COPD (chronic obstructive pulmonary disease) (HCC)    pulmologist-  dr Blenda Nicely [name])   Coronary artery calcification seen on CAT scan 11/30/2019   GERD (gastroesophageal reflux disease)    Hypercholesterolemia 09/10/2015   Hyperglycemia 09/10/2015   Malaise and fatigue 08/27/2015   Last Assessment & Plan:  Relevant Hx: Course: Daily Update: Today's Plan:will get labs to rule out acute pathology   Electronically signed by: Jenelle Mages, FNP 08/27/15 1513   Morbid obesity (HCC) 11/02/2018   On supplemental oxygen therapy    01-04-2018  per pt prescribed as needed ,  states checks oxygen level daily,  and average between 88-90% on room air,  per pt has not needed O2 past 4 months   OSA on CPAP    Osteoarthritis of right knee 01/11/2020   Pain in left knee 12/28/2017   Pancreatic cancer (HCC)    01-04-2018  per pt dx 10/ 2009 found by CT imaging, no surgery,  treatment chemotherapy  @ [facility],  in remission since 2017   Solitary thyroid nodule 09/10/2015    Past Surgical History:  Procedure Laterality Date   COLONOSCOPY  last one 2019   ESOPHAGOGASTRODUODENOSCOPY     Said last one done around 2019-2020   KNEE ARTHROSCOPY  Right 2002   KNEE ARTHROSCOPY WITH MEDIAL MENISECTOMY Left 01/06/2018   Procedure: LEFT KNEE ARTHROSCOPY WITH MEDIAL MENISECTOMY;  Surgeon: Samson Frederic, MD;  Location: [facility];  Service: Orthopedics;  Laterality: Left;   LEFT HEART CATH AND CORONARY ANGIOGRAPHY N/A 11/21/2018   Procedure: LEFT HEART CATH AND CORONARY ANGIOGRAPHY;  Surgeon: Yvonne Kendall, MD;  Location: [facility];  Service: Cardiovascular;  Laterality: N/A;    Prior to  Admission medications   Medication Sig Start Date End Date Taking? Authorizing Provider  albuterol (ACCUNEB) 0.63 MG/3ML nebulizer solution Take 3 mLs (0.63 mg total) by nebulization every 6 (six) hours as needed for wheezing or shortness of breath. 09/02/22   Martina Sinner, MD  albuterol (PROVENTIL HFA) 108 (90 Base) MCG/ACT inhaler Inhale 2 puffs into the lungs every 4 (four) hours as needed for wheezing or shortness of breath. 01/11/23   Martina Sinner, MD  allopurinol (ZYLOPRIM) 100 MG tablet Take 100 mg by mouth daily.    [provider]  ALPRAZolam Prudy Feeler) 0.25 MG tablet Take 0.25 mg by mouth 3 (three) times daily as needed for anxiety.    [provider]  aspirin EC 81 MG tablet Take 1 tablet (81 mg total) by mouth daily. Swallow whole. 09/24/22   Revankar, Aundra Dubin, MD  atorvastatin (LIPITOR) 20 MG tablet Take 1 tablet (20 mg total) by mouth daily. 09/24/22   Revankar, Aundra Dubin, MD  azelastine (ASTELIN) 0.1 % nasal spray Place 1 spray into both nostrils 2 (two) times daily. Use in each nostril as directed 02/19/21   Coralyn Helling, MD  budesonide-formoterol [facility]) 160-4.5 MCG/ACT inhaler Inhale 2 puffs into the lungs 2 (two) times daily. 01/11/23   Martina Sinner, MD  diclofenac Sodium (VOLTAREN) 1 % GEL Apply 1 application topically as needed for pain.    [provider]  Dupilumab (DUPIXENT) 300 MG/2ML SOAJ Inject 300 mg into the skin every 14 (fourteen) days. 03/19/23   Martina Sinner, MD  EPINEPHrine (EPIPEN 2-PAK) 0.3 mg/0.3 mL IJ SOAJ injection Inject 0.3 mg into the muscle as needed for anaphylaxis. 06/09/22   Lupita Leash, MD  fluticasone (FLONASE) 50 MCG/ACT nasal spray Place 1 spray into both nostrils 2 (two) times daily. 03/19/22   Coralyn Helling, MD  HYDROcodone-acetaminophen (NORCO/VICODIN) 5-325 MG tablet Take 1 tablet by mouth as needed for pain.    [provider]  ibuprofen (ADVIL) 200 MG tablet Take 200 mg by mouth every 6  (six) hours as needed for pain.    [provider]  ketorolac (TORADOL) 30 MG/ML injection Inject 1 mL into the muscle 2 (two) times a week. 07/17/22   [provider]  lidocaine (LIDODERM) 5 % Place 1 patch onto the skin daily. 08/17/22   [provider]  metolazone (ZAROXOLYN) 2.5 MG tablet Take 1 tablet (2.5 mg total) by mouth daily. 09/24/22   Revankar, Aundra Dubin, MD  montelukast (SINGULAIR) 10 MG tablet Take 1 tablet (10 mg total) by mouth at bedtime. 07/17/22   Lupita Leash, MD  mupirocin ointment (BACTROBAN) 2 % Apply 1 application topically 2 (two) times daily. 03/21/20   [provider]  Na Sulfate-K Sulfate-Mg Sulf 17.5-3.13-1.6 GM/177ML SOLN Use as directed; may use generic; goodrx card if insurance will not cover generic 03/30/23   Imogene Burn, MD  naloxone [facility]) nasal spray 4 mg/0.1 mL Place 1 spray into the nose as needed for opioid reversal.    [provider]  nitroGLYCERIN (NITROSTAT) 0.4 MG SL tablet Place 1 tablet (0.4 mg total) under the tongue every 5 (five) minutes as needed for chest pain. 09/24/22   Revankar, Aundra Dubin, MD  NYSTATIN powder Apply 1 application. topically 2 (two) times daily. 06/19/21   [provider]  omeprazole (PRILOSEC) 20 MG capsule Take 20 mg by mouth 2 (two) times daily. 06/19/21   [provider]  pentoxifylline (TRENTAL) 400 MG CR tablet Take 400 mg by mouth 2 (two) times daily. 06/24/21   [provider]  potassium chloride SA (KLOR-CON M) 20 MEQ tablet Take 2 tablets (40 mEq total) by mouth daily. 09/24/22   Revankar, Aundra Dubin, MD  Pseudoephedrine-Guaifenesin [phone] MG TB12 Take 1 tablet by mouth in the morning and at bedtime. 09/30/20   Coralyn Helling, MD  tamsulosin (FLOMAX) 0.4 MG CAPS capsule Take 0.4 mg by mouth daily. 12/26/18   [provider]  tiotropium (SPIRIVA) 18 MCG inhalation capsule Place 1 capsule (18 mcg total) into inhaler and inhale daily. 04/06/23   Martina Sinner, MD  torsemide (DEMADEX) 10 MG tablet Take 5-10 mg by mouth as needed for edema.    [provider]    No current facility-administered medications for this encounter.    Allergies as of 03/30/2023   (No Known Allergies)    Family History  Problem Relation Age of Onset   Diabetes Mother    Breast cancer Mother    Emphysema Maternal Grandfather    Colon cancer Neg Hx    Esophageal cancer Neg Hx     Social History   Socioeconomic History   Marital status: Single    Spouse name: Not on file   Number of children: Not on file   Years of education: Not on file   Highest education level: Not on file  Occupational History   Not on file  Tobacco Use   Smoking status: Former    Current packs/day: 0.00    Average packs/day: 2.0 packs/day for 31.0 years (62.0 ttl pk-yrs)    Types: Cigarettes    Start date: 14    Quit date: 2018    Years since quitting: 6.8   Smokeless tobacco: Never  Vaping Use   Vaping status: Never Used  Substance and Sexual Activity   Alcohol use: Not Currently   Drug use: Never   Sexual activity: Not on file  Other Topics Concern   Not on file  Social History Narrative   Not on file   Social Determinants of Health   Financial Resource Strain: Not on file  Food Insecurity: Not on file  Transportation Needs: Not on file  Physical Activity: Not on file  Stress: Not on file  Social Connections: Not on file  Intimate Partner Violence: Not on file    Physical Exam: Vital signs in last 24 hours: BP 132/66   Pulse 82   Temp 97.6 F (36.4 C) (Temporal)   Resp 18   Ht 5\' 11"  (1.803 m)   Wt 136.1 kg   SpO2 96%   BMI 41.84 kg/m  GEN: NAD EYE: Sclerae anicteric ENT: MMM CV: Non-tachycardic Pulm: No increased WOB GI: Soft NEURO:  Alert & Oriented   Eulah Pont, MD [facility]   04/26/2023 8:17 AM

## 2023-04-26 NOTE — Op Note (Signed)
[facility] Patient Name: Taylor Vargas Procedure Date: 04/26/2023 MRN: [id number] Attending MD: Particia Lather , , [id number] Date of Birth: 05-26-1961 CSN: [id number] Age: 62 Admit Type: Outpatient Procedure:                Upper GI endoscopy Indications:              Heartburn Providers:                Madelyn Brunner" Jesus Genera, RN, Beryle Beams, Technician, [facility], CRNA Referring MD:             Madelyn Brunner" Leonides Schanz Medicines:                Monitored Anesthesia Care Complications:            No immediate complications. Estimated Blood Loss:     Estimated blood loss was minimal. Procedure:                Pre-Anesthesia Assessment:                           - Prior to the procedure, a History and Physical                            was performed, and patient medications and                            allergies were reviewed. The patient's tolerance of                            previous anesthesia was also reviewed. The risks                            and benefits of the procedure and the sedation                            options and risks were discussed with the patient.                            All questions were answered, and informed consent                            was obtained. Prior Anticoagulants: The patient has                            taken no anticoagulant or antiplatelet agents. ASA                            Grade Assessment: III - A patient with severe                            systemic disease. After reviewing the risks and  benefits, the patient was deemed in satisfactory                            condition to undergo the procedure.                           After obtaining informed consent, the endoscope was                            passed under direct vision. Throughout the                            procedure, the patient's blood pressure, pulse, and                             oxygen saturations were monitored continuously. The                            [id number]-H190 ([id number]) Olympus endoscope was introduced                            through the mouth, and advanced to the second part                            of duodenum. The upper GI endoscopy was                            accomplished without difficulty. The patient                            tolerated the procedure well. Scope In: Scope Out: Findings:      The examined esophagus was normal.      The entire examined stomach was normal.      Two 2 to 3 mm sessile polyps with no bleeding were found in the duodenal       bulb. These polyps were removed with a cold biopsy forceps. Resection       and retrieval were complete.      A single medium angioectasia without bleeding was found in the second       portion of the duodenum. Impression:               - Normal esophagus.                           - Normal stomach.                           - Two duodenal polyps. Resected and retrieved.                           - A single non-bleeding angioectasia in the                            duodenum. Moderate Sedation:      Not Applicable - Patient had care per Anesthesia. Recommendation:           -  Await pathology results.                           - Perform a colonoscopy today. Procedure Code(s):        --- Professional ---                           [phone], Esophagogastroduodenoscopy, flexible,                            transoral; with biopsy, single or multiple Diagnosis Code(s):        --- Professional ---                           K31.7, Polyp of stomach and duodenum                           R12, Heartburn CPT copyright 2022 [employer] Association. All rights reserved. The codes documented in this report are preliminary and upon coder review may  be revised to meet current compliance requirements. Dr Particia Lather "Alan Ripper" Leonides Schanz,  04/26/2023 10:40:03 AM Number of  Addenda: 0

## 2023-04-26 NOTE — Transfer of Care (Signed)
Immediate Anesthesia Transfer of Care Note  Patient: [place] Setting  Procedure(s) Performed: COLONOSCOPY WITH PROPOFOL ESOPHAGOGASTRODUODENOSCOPY (EGD) WITH PROPOFOL BIOPSY POLYPECTOMY  Patient Location: PACU  Anesthesia Type:MAC  Level of Consciousness: awake, alert , and oriented  Airway & Oxygen Therapy: Patient Spontanous Breathing and Patient connected to nasal cannula oxygen  Post-op Assessment: Report given to RN and Post -op Vital signs reviewed and stable  Post vital signs: Reviewed and stable  Last Vitals:  Vitals Value Taken Time  BP    Temp    Pulse 98 04/26/23 1043  Resp    SpO2 94 % 04/26/23 1043  Vitals shown include unfiled device data.  Last Pain:  Vitals:   04/26/23 0802  TempSrc: Temporal  PainSc: 9       Patients Stated Pain Goal: 4 (04/26/23 0802)  Complications: No notable events documented.

## 2023-04-27 ENCOUNTER — Other Ambulatory Visit (HOSPITAL_COMMUNITY): Payer: Self-pay

## 2023-04-27 ENCOUNTER — Other Ambulatory Visit: Payer: Self-pay

## 2023-04-27 ENCOUNTER — Encounter: Payer: Self-pay | Admitting: Internal Medicine

## 2023-04-27 LAB — SURGICAL PATHOLOGY

## 2023-04-27 NOTE — Progress Notes (Signed)
Specialty Pharmacy Refill Coordination Note  Taylor Vargas is a 62 y.o. male contacted today regarding refills of specialty medication(s) Dupilumab   Patient requested Delivery   Delivery date: 05/18/23   Verified address: [address]   Medication will be filled on 05/17/23.

## 2023-04-27 NOTE — Progress Notes (Signed)
Specialty Pharmacy Ongoing Clinical Assessment Note  Taylor Vargas is a 62 y.o. male who is being followed by the specialty pharmacy service for RxSp Asthma/COPD   Patient's specialty medication(s) reviewed today: Dupilumab   Missed doses in the last 4 weeks: 0   Patient/Caregiver did not have any additional questions or concerns.   Therapeutic benefit summary: Patient is achieving benefit   Adverse events/side effects summary: No adverse events/side effects   Patient's therapy is appropriate to: Continue    Goals Addressed             This Visit's Progress    Reduce disease symptoms including coughing and shortness of breath       Patient is on track. Patient will maintain adherence         Follow up:  6 months  Otto Herb Specialty Pharmacist

## 2023-04-28 ENCOUNTER — Encounter (HOSPITAL_COMMUNITY): Payer: Self-pay | Admitting: Internal Medicine

## 2023-05-17 ENCOUNTER — Other Ambulatory Visit: Payer: Self-pay

## 2023-05-22 ENCOUNTER — Other Ambulatory Visit: Payer: Self-pay | Admitting: Pulmonary Disease

## 2023-05-24 ENCOUNTER — Encounter: Payer: Self-pay | Admitting: Adult Health

## 2023-05-24 ENCOUNTER — Ambulatory Visit (INDEPENDENT_AMBULATORY_CARE_PROVIDER_SITE_OTHER): Payer: Medicaid Other | Admitting: Adult Health

## 2023-05-24 VITALS — BP 128/78 | HR 104 | Temp 98.2°F | Ht 72.0 in | Wt 313.8 lb

## 2023-05-24 DIAGNOSIS — J9612 Chronic respiratory failure with hypercapnia: Secondary | ICD-10-CM | POA: Diagnosis not present

## 2023-05-24 DIAGNOSIS — J9611 Chronic respiratory failure with hypoxia: Secondary | ICD-10-CM | POA: Diagnosis not present

## 2023-05-24 DIAGNOSIS — J4489 Other specified chronic obstructive pulmonary disease: Secondary | ICD-10-CM | POA: Diagnosis not present

## 2023-05-24 DIAGNOSIS — Z23 Encounter for immunization: Secondary | ICD-10-CM

## 2023-05-24 MED ORDER — TIOTROPIUM BROMIDE MONOHYDRATE 18 MCG IN CAPS: 1.0000 | ORAL_CAPSULE | Freq: Every day | RESPIRATORY_TRACT | 3 refills | Status: DC

## 2023-05-24 MED ORDER — MONTELUKAST SODIUM 10 MG PO TABS: 10.0000 mg | ORAL_TABLET | Freq: Every day | ORAL | 3 refills | Status: DC

## 2023-05-24 MED ORDER — BUDESONIDE-FORMOTEROL FUMARATE 160-4.5 MCG/ACT IN AERO: 2.0000 | INHALATION_SPRAY | Freq: Two times a day (BID) | RESPIRATORY_TRACT | 3 refills | Status: DC

## 2023-05-24 MED ORDER — ALBUTEROL SULFATE HFA 108 (90 BASE) MCG/ACT IN AERS: 1.0000 | INHALATION_SPRAY | RESPIRATORY_TRACT | 3 refills | Status: DC | PRN

## 2023-05-24 MED ORDER — AZELASTINE HCL 0.1 % NA SOLN: 1.0000 | Freq: Two times a day (BID) | NASAL | 3 refills | Status: DC | PRN

## 2023-05-24 MED ORDER — OHTUVAYRE 3 MG/2.5ML IN SUSP: 1.0000 | Freq: Two times a day (BID) | RESPIRATORY_TRACT | 5 refills | Status: AC

## 2023-05-24 MED ORDER — FLUTICASONE PROPIONATE 50 MCG/ACT NA SUSP: 1.0000 | Freq: Two times a day (BID) | NASAL | 3 refills | Status: DC | PRN

## 2023-05-24 NOTE — Progress Notes (Signed)
@Patient  ID: Taylor Vargas, male    DOB: 10/06/1960, 62 y.o.   MRN: [id number]  Chief Complaint  Patient presents with   Follow-up    Referring provider: Street, Stephanie Coup, * Discussed the use of AI scribe software for clinical note transcription with the patient, who gave verbal consent to proceed.  HPI: 62 year old male former smoker followed for COPD with emphysema and asthma, obstructive sleep apnea on noninvasive ventilator (astral NIV ), chronic hypoxic and hypercarbic respiratory failure Medical history significant for diastolic heart failure, colon cancer, pancreatic cancer, bladder cancer, chronic sinusitis  TEST/EVENTS :  PFT 12/22/19 >> FEV1 0.57 (19%), FEV1% 88, TLC 5.55 (76%), DLCO 32%   CT chest 11/01/19 >> moderate centrilobular and paraseptal emphysema, subcarinal LN 1.2 cm, Rt juxta esophageal LN 1.1 cm CT chest 03/14/20 >> moderate centrilobular emphysema, 3 mm nodule RUL new, 3 mm nodule RUL new, stable LAN CT chest 09/19/20 >> Rt upper nodule resolved   Sleep Tests:  PSG 04/06/19 >> AHI 8.9, SpO2 low 71%, CPAP 16 cm H2O CPAP 04/06/20 to 07/04/20 >> used on 90 of 90 nights with average 13 hrs 8 min.  Average AHI 1.2 with CPAP 16 cm H2O Astral 11/12/20 to 12/16/20 >> used on 35 of 35 nights with average 14 hrs 46 min.  Average AHI 0.6 with median pressure 18/10 and 95 th percentile pressure 29/15 cm H2O.  Median Vt 510 and 95 th percentile Vt 744 ml.     Cardiac Tests:  LHC 11/21/18 >> non obstructive CAD Echo 11/25/18 >> EF 60 to 65%   05/24/2023 Follow up: COPD/Asthma, O2 RF,  Patient presents for a 65-month follow-up.  Patient has a very severe COPD with asthma overlap.  He has hypercarbic and hypoxic respiratory failure on oxygen 3 L with activity and on nocturnal noninvasive ventilator (astral NIV) at bedtime Patient says he continues to be very short of breath and gets winded with minimal activities.  He does feel like his breathing has been slowly getting worse and  he has decreased activity tolerance.  He remains on Symbicort twice daily and Spiriva daily.  He is on Singulair and Flonase daily.  He also is on Dupixent every 2 weeks.  Does feel that since starting Dupixent recently he has had less sinus infections and allergy symptoms.  Denies any flare of cough or wheezing. He remains on oxygen 3 L with activity.  Does admit that he does not always consistently wear this.  We discussed the potential for complications of hypoxemia.  Patient has a history of sleep apnea and hypercarbic/hypoxic respiratory failure.  He endorses excellent compliance on his noninvasive ventilator/astral NIV download shows excellent compliance with daily average usage at 13 hours AHI 0.7  The patient has a history of multiple cancers, including pancreatic and bladder cancer. He is followed at [facility] with serial imaging and labs. Records are unavailable.   The patient is scheduled for an outpatient hernia operation, which he reports as causing significant pain . Is having surgery on 06/07/23 with Dr. Logan Bores at [facility] .  He denies any previous difficulties with surgery or anesthesia.  No Known Allergies  Immunization History  Administered Date(s) Administered   Influenza Inj Mdck Quad Pf 02/27/2020   Influenza,inj,Quad PF,6+ Mos 04/09/2017   Influenza,inj,quad, With Preservative 02/26/2019   Moderna Sars-Covid-2 Vaccination 09/02/2019   Respiratory Syncytial Virus Vaccine,Recomb Aduvanted(Arexvy) 04/15/2022    Past Medical History:  Diagnosis Date   Abnormal glucose 08/27/2015  Last Assessment & Plan:  Relevant Hx: Course: Daily Update: Today's Plan:will get an a1c especially given his increased urinary frequency  Electronically signed by: Jenelle Mages, FNP 08/27/15 1513   Arthritis    Benign prostatic hyperplasia with lower urinary tract symptoms 08/27/2015   Last Assessment & Plan:  Relevant Hx: Course: Daily Update: Today's Plan:taking flomax  for this, but his was presumed, no workup for this diagnosis  Electronically signed by: Jenelle Mages, FNP 08/27/15 1513   Chest tightness 11/08/2018   Chronic diastolic heart failure (HCC) 04/21/2019   Chronic obstructive pulmonary disease (HCC) 08/27/2015   Last Assessment & Plan:  Relevant Hx: Course: Daily Update: Today's Plan:this is likely in acute exacerbation. I have discussed with him that I would like to et cxr to rule out acute pathology. He finally agrees to this. His sat came up to 89% on RA He understands s/e's of the prednisone and if he does have an element of diabetes or prediabtes this could make that worse for him  Electronically sig   Chronic pain of both knees 01/04/2020   Formatting of this note might be different from the original. Added automatically from request for surgery [id number]   Congenital absence of one kidney    01-04-2018 per pt unsure which one   COPD (chronic obstructive pulmonary disease) (HCC)    pulmologist-  dr Blenda Nicely [name])   Coronary artery calcification seen on CAT scan 11/30/2019   GERD (gastroesophageal reflux disease)    Hypercholesterolemia 09/10/2015   Hyperglycemia 09/10/2015   Malaise and fatigue 08/27/2015   Last Assessment & Plan:  Relevant Hx: Course: Daily Update: Today's Plan:will get labs to rule out acute pathology   Electronically signed by: Jenelle Mages, FNP 08/27/15 1513   Morbid obesity (HCC) 11/02/2018   On supplemental oxygen therapy    01-04-2018  per pt prescribed as needed ,  states checks oxygen level daily,  and average between 88-90% on room air,  per pt has not needed O2 past 4 months   OSA on CPAP    Osteoarthritis of right knee 01/11/2020   Pain in left knee 12/28/2017   Pancreatic cancer (HCC)    01-04-2018  per pt dx 10/ 2009 found by CT imaging, no surgery,  treatment chemotherapy  @ [facility],  in remission since 2017   Solitary thyroid nodule 09/10/2015    Tobacco History: Social History   Tobacco Use   Smoking Status Former   Current packs/day: 0.00   Average packs/day: 2.0 packs/day for 31.0 years (62.0 ttl pk-yrs)   Types: Cigarettes   Start date: 25   Quit date: 2018   Years since quitting: 6.9  Smokeless Tobacco Never   Counseling given: Not Answered   Outpatient Medications Prior to Visit  Medication Sig Dispense Refill   albuterol (ACCUNEB) 0.63 MG/3ML nebulizer solution Take 3 mLs (0.63 mg total) by nebulization every 6 (six) hours as needed for wheezing or shortness of breath. 360 mL 3   albuterol (PROVENTIL HFA) 108 (90 Base) MCG/ACT inhaler Inhale 2 puffs into the lungs every 4 (four) hours as needed for wheezing or shortness of breath. 54 g 3   allopurinol (ZYLOPRIM) 100 MG tablet Take 100 mg by mouth daily.     ALPRAZolam (XANAX) 0.25 MG tablet Take 0.25 mg by mouth 3 (three) times daily as needed for anxiety.     aspirin EC 81 MG tablet Take 1 tablet (81 mg total) by mouth daily. Swallow whole. 90  tablet 3   atorvastatin (LIPITOR) 20 MG tablet Take 1 tablet (20 mg total) by mouth daily. 90 tablet 3   azelastine (ASTELIN) 0.1 % nasal spray Place 1 spray into both nostrils 2 (two) times daily. Use in each nostril as directed 90 mL 3   budesonide-formoterol (SYMBICORT) 160-4.5 MCG/ACT inhaler Inhale 2 puffs into the lungs 2 (two) times daily. 30.6 g 3   diclofenac Sodium (VOLTAREN) 1 % GEL Apply 1 application topically as needed for pain.     Dupilumab (DUPIXENT) 300 MG/2ML SOAJ Inject 300 mg into the skin every 14 (fourteen) days. 12 mL 1   EPINEPHrine (EPIPEN 2-PAK) 0.3 mg/0.3 mL IJ SOAJ injection Inject 0.3 mg into the muscle as needed for anaphylaxis. 2 each 5   fluticasone (FLONASE) 50 MCG/ACT nasal spray Place 1 spray into both nostrils 2 (two) times daily. 48 g 3   HYDROcodone-acetaminophen (NORCO/VICODIN) 5-325 MG tablet Take 1 tablet by mouth as needed for pain.     ibuprofen (ADVIL) 200 MG tablet Take 200 mg by mouth every 6 (six) hours as needed for pain.      ketorolac (TORADOL) 30 MG/ML injection Inject 1 mL into the muscle 2 (two) times a week.     lidocaine (LIDODERM) 5 % Place 1 patch onto the skin daily.     metolazone (ZAROXOLYN) 2.5 MG tablet Take 1 tablet (2.5 mg total) by mouth daily. 90 tablet 3   montelukast (SINGULAIR) 10 MG tablet Take 1 tablet (10 mg total) by mouth at bedtime. 90 tablet 1   mupirocin ointment (BACTROBAN) 2 % Apply 1 application topically 2 (two) times daily.     Na Sulfate-K Sulfate-Mg Sulf 17.5-3.13-1.6 GM/177ML SOLN Use as directed; may use generic; goodrx card if insurance will not cover generic 354 mL 0   naloxone (NARCAN) nasal spray 4 mg/0.1 mL Place 1 spray into the nose as needed for opioid reversal.     nitroGLYCERIN (NITROSTAT) 0.4 MG SL tablet Place 1 tablet (0.4 mg total) under the tongue every 5 (five) minutes as needed for chest pain. 25 tablet 11   NYSTATIN powder Apply 1 application. topically 2 (two) times daily.     omeprazole (PRILOSEC) 20 MG capsule Take 20 mg by mouth 2 (two) times daily.     pentoxifylline (TRENTAL) 400 MG CR tablet Take 400 mg by mouth 2 (two) times daily.     potassium chloride SA (KLOR-CON M) 20 MEQ tablet Take 2 tablets (40 mEq total) by mouth daily. 180 tablet 3   Pseudoephedrine-Guaifenesin 423 [id number] MG TB12 Take 1 tablet by mouth in the morning and at bedtime. 30 tablet 1   tamsulosin (FLOMAX) 0.4 MG CAPS capsule Take 0.4 mg by mouth daily.     tiotropium (SPIRIVA) 18 MCG inhalation capsule Place 1 capsule (18 mcg total) into inhaler and inhale daily. 90 capsule 2   torsemide (DEMADEX) 10 MG tablet Take 5-10 mg by mouth as needed for edema.     No facility-administered medications prior to visit.     Review of Systems:   Constitutional:   No  weight loss, night sweats,  Fevers, chills, +fatigue, or  lassitude.  HEENT:   No headaches,  Difficulty swallowing,  Tooth/dental problems, or  Sore throat,                No sneezing, itching, ear ache, nasal congestion, post  nasal drip,   CV:  No chest pain,  Orthopnea, PND, swelling in lower extremities, anasarca, dizziness,  palpitations, syncope.   GI  No heartburn, indigestion, abdominal pain, nausea, vomiting, diarrhea, change in bowel habits, loss of appetite, bloody stools.   Resp:   No chest wall deformity  Skin: no rash or lesions.  GU: no dysuria, change in color of urine, no urgency or frequency.  No flank pain, no hematuria   MS:  No joint pain or swelling.  No decreased range of motion.  No back pain.    Physical Exam  BP 128/78 (BP Location: Left Arm, Patient Position: Sitting, Cuff Size: Large)   Pulse (!) 104   Temp 98.2 F (36.8 C) (Oral)   Ht 6' (1.829 m)   Wt (!) 313 lb 12.8 oz (142.3 kg)   SpO2 95%   BMI 42.56 kg/m   GEN: A/Ox3; pleasant , NAD, well nourished , chronically ill-appearing , rolling walker,    HEENT:  [place]/AT,  EACs-clear, TMs-wnl, NOSE-clear, THROAT-clear, no lesions, no postnasal drip or exudate noted.   NECK:  Supple w/ fair ROM; no JVD; normal carotid impulses w/o bruits; no thyromegaly or nodules palpated; no lymphadenopathy.    RESP  Clear  P & A; w/o, wheezes/ rales/ or rhonchi. no accessory muscle use, no dullness to percussion  CARD:  RRR, no m/r/g, no peripheral edema, pulses intact, no cyanosis or clubbing.  GI:   Soft & nt; nml bowel sounds; no organomegaly or masses detected.   Musco: Warm bil, no deformities or joint swelling noted.   Neuro: alert, no focal deficits noted.    Skin: Warm, no lesions or rashes    Lab Results:  CBC     BNP No results found for: "BNP"  ProBNP   Imaging: No results found.  Administration History     None          11/22/2019   10:43 AM  PFT Results  TLC 5.55         This result is from an external source.    No results found for: "NITRICOXIDE"      Assessment & Plan:   Assessment and Plan    Chronic Obstructive Pulmonary Disease (COPD) Very severe COPD with asthma overlap.   Previous pulmonary function testing showing decreased lung capacity with FEV1 at 19 to 22% and decreased diffusing capacity around 30%.  He is oxygen dependent with exertional hypoxemia and hypercarbic/hypoxic respiratory failure and sleep apnea on noninvasive ventilator at bedtime.  Despite maximum triple therapy, oxygen and noninvasive vent he continues to have significant symptom burden.  Will try [employer] Twice daily  .  To help with symptom management. Patient education given.  Encouraged on oxygen usage with activities to prevent hypoxemia continue on nocturnal noninvasive vent support Continue on triple therapy maintenance with Symbicort Spiriva Singulair and Dupixent. History of heavy smoking and previous cancers including pancreatic, bladder and colon.  Would recommend a noncontrasted CT chest .  Hypoxic and hypercarbic respiratory failure and sleep apnea-patient had excellent compliance and control on noninvasive ventilator at bedtime. Continue with oxygen with activity.  Check overnight oximetry test on noninvasive ventilator to make sure he does not need oxygen nocturnally.   Sleep Apnea Sleep apnea is well-controlled with a nighttime ventilator, patient has excellent control and compliance.   Diastolic Heart Failure Chronic diastolic heart failure appears euvolemic on exam.  No evidence of volume overload  Inguinal Hernia-left-sided Pulmonary preop risk assessment requested. surgery is scheduled for June 07, 2023 at [facility] hospital with general surgery-Dr Logan Bores.  From a  pulmonary standpoint patient would be high risk given his underlying severe COPD, chronic respiratory failure, sleep apnea and significant symptom burden.  He also is in a deconditioned state.  We had a long discussion regarding pulmonary risk factors please see below.  Major Pulmonary risks identified in the multifactorial risk analysis are but not limited to a) pneumonia; b) recurrent  intubation risk; c) prolonged or recurrent acute respiratory failure needing mechanical ventilation; d) prolonged hospitalization; e) DVT/Pulmonary embolism; f) Acute Pulmonary edema  If deemed appropriate by Surgeon/anesthesia Recommend 1. Short duration of surgery as much as possible and avoid paralytics if possible 2. Recovery in step down or ICU with Pulmonary consultation if indicated. 3. Aggressive pulmonary toilet with o2, bronchodilatation, and incentive spirometry and early ambulation 4. NIV /BIPAP post op may be necessary.  5. If overnight stay, NIV At bedtime .   History of pancreatic and bladder cancer -continue follow-up with oncology/urology  General Health Maintenance Flu shot and RSV vaccine are up-to-date  Follow-up A follow-up is scheduled in three months.  Med refills completed per request   I spent   42 minutes dedicated to the care of this patient on the date of this encounter to include pre-visit review of records, face-to-face time with the patient discussing conditions above, post visit ordering of testing, clinical documentation with the electronic health record, making appropriate referrals as documented, and communicating necessary findings to members of the patients care team.   Rubye Oaks, NP 05/24/2023

## 2023-05-24 NOTE — Patient Instructions (Addendum)
Continue on Symbicort and Spiriva  Add Ohtuvayre Neb Twice daily   Continue on Dupixent every 2 weeks .  Continue on Flonase and Singulair daily .  Albuterol inhaler or neb As needed   Continue on Oxygen 3l/m with activity, goal is O2 sats >88-90% Set up for CT chest without contrast.  Check Overnight oximetry on Vent (room air) .  Continue on Astral Vent At bedtime and with naps.  Activity as tolerated.  Work on healthy weight loss.  Follow up with Dr. Francine Graven in 3 months and As needed

## 2023-05-31 ENCOUNTER — Telehealth: Payer: Self-pay | Admitting: *Deleted

## 2023-05-31 NOTE — Telephone Encounter (Signed)
Called and spoke with patient regarding consent form for [employer], he lives in [place] and requested that I mail the form to his home address that we have on file.  I asked that he mail the form back to [place] once he has signed it.  He verbalized understanded.  I verified his home address and form was mailed to patient.  The remainder of the form was signed by Rubye Oaks NP and stapled to the prescription.  Forms placed in the pharmacy box with a note that patient needs to complete the patient form and mail back.  Will await return of signed form from patient.

## 2023-06-03 ENCOUNTER — Telehealth: Payer: Self-pay | Admitting: Adult Health

## 2023-06-03 NOTE — Telephone Encounter (Signed)
Can order water bottle for O2 concentrator thru DME

## 2023-06-03 NOTE — Telephone Encounter (Signed)
Overnight oximetry test done on May 26, 2023 showed decreased oxygen levels on noninvasive ventilator.  O2 saturations less than 88% 3 hours and 4 minutes during overnight test. Recommend begin oxygen 2 L with noninvasive ventilator at bedtime.

## 2023-06-03 NOTE — Telephone Encounter (Signed)
ONO results advised. Patient wants to know if he needs to place the water bottle on the machine? Please advise.

## 2023-06-03 NOTE — Telephone Encounter (Signed)
 Patient advised. Nothing further needed.

## 2023-06-04 ENCOUNTER — Other Ambulatory Visit: Payer: Self-pay

## 2023-06-04 NOTE — Progress Notes (Signed)
Specialty Pharmacy Refill Coordination Note  Taylor Vargas is a 62 y.o. male contacted today regarding refills of specialty medication(s) Dupilumab (Dupixent)   Patient requested Delivery   Delivery date: 06/11/23   Verified address: [address]   Medication will be filled on 12.26.24.

## 2023-06-10 ENCOUNTER — Ambulatory Visit
Admission: RE | Admit: 2023-06-10 | Discharge: 2023-06-10 | Disposition: A | Discharge status: Home / Self Care | Payer: Medicaid Other | Source: Ambulatory Visit | Attending: Adult Health | Admitting: Adult Health

## 2023-06-10 ENCOUNTER — Other Ambulatory Visit: Payer: Medicaid Other

## 2023-06-10 DIAGNOSIS — J4489 Other specified chronic obstructive pulmonary disease: Secondary | ICD-10-CM

## 2023-06-10 NOTE — Telephone Encounter (Signed)
Patient came in the office and filled out form- completed form placed in pharmacy box.

## 2023-06-23 ENCOUNTER — Encounter: Payer: Self-pay | Admitting: Cardiology

## 2023-06-23 ENCOUNTER — Encounter: Payer: Self-pay | Admitting: Adult Health

## 2023-06-23 ENCOUNTER — Ambulatory Visit: Payer: Medicaid Other | Attending: Cardiology | Admitting: Cardiology

## 2023-06-23 VITALS — BP 105/62 | HR 100 | Ht 72.0 in | Wt 311.2 lb

## 2023-06-23 DIAGNOSIS — E7800 Pure hypercholesterolemia, unspecified: Secondary | ICD-10-CM | POA: Diagnosis not present

## 2023-06-23 DIAGNOSIS — Z1329 Encounter for screening for other suspected endocrine disorder: Secondary | ICD-10-CM

## 2023-06-23 DIAGNOSIS — I2510 Atherosclerotic heart disease of native coronary artery without angina pectoris: Secondary | ICD-10-CM | POA: Diagnosis not present

## 2023-06-23 MED ORDER — POTASSIUM CHLORIDE CRYS ER 20 MEQ PO TBCR: 40.0000 meq | EXTENDED_RELEASE_TABLET | Freq: Every day | ORAL | 3 refills | Status: DC

## 2023-06-23 MED ORDER — ASPIRIN 81 MG PO TBEC: 81.0000 mg | DELAYED_RELEASE_TABLET | Freq: Every day | ORAL | 3 refills | Status: DC

## 2023-06-23 MED ORDER — ATORVASTATIN CALCIUM 20 MG PO TABS: 20.0000 mg | ORAL_TABLET | Freq: Every day | ORAL | 3 refills | Status: DC

## 2023-06-23 MED ORDER — NITROGLYCERIN 0.4 MG SL SUBL: 0.4000 mg | SUBLINGUAL_TABLET | SUBLINGUAL | 11 refills | Status: DC | PRN

## 2023-06-23 MED ORDER — METOLAZONE 2.5 MG PO TABS: 2.5000 mg | ORAL_TABLET | Freq: Every day | ORAL | 3 refills | Status: DC

## 2023-06-23 NOTE — Progress Notes (Signed)
 Cardiology Office Note:    Date:  06/23/2023   ID:  Taylor Vargas, DOB [birth year], MRN [id number]  PCP:  Street, Lonni HERO, MD  Cardiologist:  Jennifer JONELLE Crape, MD   Referring MD: [address] Lonni HERO, *    ASSESSMENT:    1. Coronary artery calcification seen on CAT scan   2. Hypercholesterolemia   3. Thyroid disorder screen    PLAN:    In order of problems listed above:  Coronary artery calcification: Secondary prevention stressed to the patient.  Importance of compliance with diet medication stressed and vocalized understanding.  He was advised to ambulate to the best of his ability. Essential hypertension: Blood pressure stable and diet was emphasized. Mixed dyslipidemia: On lipid-lowering medications and is fasting and will have complete blood work today.  Goal LDL less than 60. Morbid obesity and COPD on supplemental oxygen : Stable and monitored by primary care.  Diet emphasized.  Weight reduction stressed.  He promises to do better.  Risks of obesity explained. Patient will be seen in follow-up appointment in 6 months or earlier if the patient has any concerns.    Medication Adjustments/Labs and Tests Ordered: Current medicines are reviewed at length with the patient today.  Concerns regarding medicines are outlined above.  Orders Placed This Encounter  Procedures   Comprehensive metabolic panel   CBC   Lipid panel   TSH   Meds ordered this encounter  Medications   potassium chloride  SA (KLOR-CON  M) 20 MEQ tablet    Sig: Take 2 tablets (40 mEq total) by mouth daily.    Dispense:  180 tablet    Refill:  3    **Patient requests 90 days supply**   nitroGLYCERIN  (NITROSTAT ) 0.4 MG SL tablet    Sig: Place 1 tablet (0.4 mg total) under the tongue every 5 (five) minutes as needed for chest pain.    Dispense:  25 tablet    Refill:  11   metolazone  (ZAROXOLYN ) 2.5 MG tablet    Sig: Take 1 tablet (2.5 mg total) by mouth daily.    Dispense:  90 tablet    Refill:  3     **Patient requests 90 days supply**   aspirin  EC 81 MG tablet    Sig: Take 1 tablet (81 mg total) by mouth daily. Swallow whole.    Dispense:  90 tablet    Refill:  3   atorvastatin  (LIPITOR) 20 MG tablet    Sig: Take 1 tablet (20 mg total) by mouth daily.    Dispense:  90 tablet    Refill:  3     No chief complaint on file.    History of Present Illness:    Taylor Vargas is a 63 y.o. male.  Patient has past medical history of coronary artery calcification, essential hypertension, mixed dyslipidemia, morbid obesity and COPD on oxygen .  He denies any problems at this time and takes care of activities of daily living.  No chest pain orthopnea or PND.  At the time of my evaluation, the patient is alert awake oriented and in no distress.  Past Medical History:  Diagnosis Date   Abnormal glucose 08/27/2015   Last Assessment & Plan:  Relevant Hx: Course: Daily Update: Today's Plan:will get an a1c especially given his increased urinary frequency  Electronically signed by: [place] Dariel Sayres, FNP 08/27/15 1513   Arthritis    Benign prostatic hyperplasia with lower urinary tract symptoms 08/27/2015   Last Assessment & Plan:  Relevant Hx: Course: Daily  Update: Today's Plan:taking flomax for this, but his was presumed, no workup for this diagnosis  Electronically signed by: [place] Dariel Sayres, FNP 08/27/15 1513   Chest tightness 11/08/2018   Chronic diastolic heart failure (HCC) 04/21/2019   Chronic obstructive pulmonary disease (HCC) 08/27/2015   Last Assessment & Plan:  Relevant Hx: Course: Daily Update: Today's Plan:this is likely in acute exacerbation. I have discussed with him that I would like to et cxr to rule out acute pathology. He finally agrees to this. His sat came up to 89% on RA He understands s/e's of the prednisone  and if he does have an element of diabetes or prediabtes this could make that worse for him  Electronically sig   Chronic pain of both knees 01/04/2020    Formatting of this note might be different from the original. Added automatically from request for surgery [id number]   Congenital absence of one kidney    01-04-2018 per pt unsure which one   COPD (chronic obstructive pulmonary disease) (HCC)    pulmologist-  dr mardee jennye)   Coronary artery calcification seen on CAT scan 11/30/2019   GERD (gastroesophageal reflux disease)    History of colon polyps 04/26/2023   Hypercholesterolemia 09/10/2015   Hyperglycemia 09/10/2015   Malaise and fatigue 08/27/2015   Last Assessment & Plan:  Relevant Hx: Course: Daily Update: Today's Plan:will get labs to rule out acute pathology   Electronically signed by: [place] Dariel Sayres, FNP 08/27/15 1513   Morbid obesity (HCC) 11/02/2018   On supplemental oxygen  therapy    01-04-2018  per pt prescribed as needed ,  states checks oxygen  level daily,  and average between 88-90% on room air,  per pt has not needed O2 past 4 months   OSA on CPAP    Osteoarthritis of right knee 01/11/2020   Pain in left knee 12/28/2017   Pancreatic cancer (HCC)    01-04-2018  per pt dx 10/ 2009 found by CT imaging, no surgery,  treatment chemotherapy  @ [facility],  in remission since 2017   Solitary thyroid nodule 09/10/2015    Past Surgical History:  Procedure Laterality Date   BIOPSY  04/26/2023   Procedure: BIOPSY;  Surgeon: Federico Rosario BROCKS, MD;  Location: [facility];  Service: Gastroenterology;;   COLONOSCOPY  last one 2019   COLONOSCOPY WITH PROPOFOL  N/A 04/26/2023   Procedure: COLONOSCOPY WITH PROPOFOL ;  Surgeon: Federico Rosario BROCKS, MD;  Location: [facility];  Service: Gastroenterology;  Laterality: N/A;   ESOPHAGOGASTRODUODENOSCOPY     Said last one done around 2019-2020   ESOPHAGOGASTRODUODENOSCOPY (EGD) WITH PROPOFOL  N/A 04/26/2023   Procedure: ESOPHAGOGASTRODUODENOSCOPY (EGD) WITH PROPOFOL ;  Surgeon: Federico Rosario BROCKS, MD;  Location: [facility];  Service: Gastroenterology;  Laterality: N/A;   KNEE ARTHROSCOPY Right  2002   KNEE ARTHROSCOPY WITH MEDIAL MENISECTOMY Left 01/06/2018   Procedure: LEFT KNEE ARTHROSCOPY WITH MEDIAL MENISECTOMY;  Surgeon: Fidel Rogue, MD;  Location: [facility];  Service: Orthopedics;  Laterality: Left;   LEFT HEART CATH AND CORONARY ANGIOGRAPHY N/A 11/21/2018   Procedure: LEFT HEART CATH AND CORONARY ANGIOGRAPHY;  Surgeon: Mady Bruckner, MD;  Location: [facility];  Service: Cardiovascular;  Laterality: N/A;   POLYPECTOMY  04/26/2023   Procedure: POLYPECTOMY;  Surgeon: Federico Rosario BROCKS, MD;  Location: [facility];  Service: Gastroenterology;;    Current Medications: Current Meds  Medication Sig   albuterol  (ACCUNEB ) 0.63 MG/3ML nebulizer solution USE 1 VIAL VIA NEBULIZER EVERY 6 HOURS AS NEEDED FOR WHEEZING OR  SHORTNESS OF BREATH   albuterol  (PROVENTIL  HFA) 108 (90 Base) MCG/ACT inhaler Inhale 1-2 puffs into the lungs every 4 (four) hours as needed for wheezing or shortness of breath.   allopurinol (ZYLOPRIM) 100 MG tablet Take 100 mg by mouth daily.   ALPRAZolam (XANAX) 0.25 MG tablet Take 0.25 mg by mouth 3 (three) times daily as needed for anxiety.   azelastine  (ASTELIN ) 0.1 % nasal spray Place 1 spray into both nostrils 2 (two) times daily as needed for rhinitis.   budesonide -formoterol  (SYMBICORT ) 160-4.5 MCG/ACT inhaler Inhale 2 puffs into the lungs 2 (two) times daily.   cyanocobalamin (VITAMIN B12) 1000 MCG/ML injection Inject 1,000 mcg into the muscle every 30 (thirty) days.   diclofenac Sodium (VOLTAREN) 1 % GEL Apply 1 application topically as needed for pain.   Dupilumab  (DUPIXENT ) 300 MG/2ML SOAJ Inject 300 mg into the skin every 14 (fourteen) days.   Ensifentrine  (OHTUVAYRE ) 3 MG/2.5ML SUSP Inhale 1 each into the lungs 2 (two) times daily.   EPINEPHrine  (EPIPEN  2-PAK) 0.3 mg/0.3 mL IJ SOAJ injection Inject 0.3 mg into the muscle as needed for anaphylaxis.   famotidine (PEPCID) 40 MG tablet Take 40 mg by mouth daily as needed for  heartburn or indigestion.   fluticasone  (FLONASE ) 50 MCG/ACT nasal spray Place 1 spray into both nostrils 2 (two) times daily as needed for allergies or rhinitis.   HYDROcodone -acetaminophen  (NORCO/VICODIN) 5-325 MG tablet Take 1 tablet by mouth as needed for pain.   ibuprofen (ADVIL) 200 MG tablet Take 200 mg by mouth every 6 (six) hours as needed for pain.   ketorolac (TORADOL) 30 MG/ML injection Inject 1 mL into the muscle 2 (two) times a week.   lidocaine  (LIDODERM ) 5 % Place 1 patch onto the skin daily.   LORazepam (ATIVAN) 2 MG tablet Take 1-2 mg by mouth every 8 (eight) hours as needed for anxiety.   montelukast  (SINGULAIR ) 10 MG tablet Take 1 tablet (10 mg total) by mouth at bedtime.   mupirocin ointment (BACTROBAN) 2 % Apply 1 application topically 2 (two) times daily.   Na Sulfate-K Sulfate-Mg Sulf 17.5-3.13-1.6 GM/177ML SOLN Use as directed; may use generic; goodrx card if insurance will not cover generic   naloxone (NARCAN) nasal spray 4 mg/0.1 mL Place 1 spray into the nose as needed for opioid reversal.   NYSTATIN powder Apply 1 application. topically 2 (two) times daily.   omeprazole (PRILOSEC) 20 MG capsule Take 20 mg by mouth 2 (two) times daily.   pentoxifylline (TRENTAL) 400 MG CR tablet Take 400 mg by mouth 2 (two) times daily.   Pseudoephedrine -Guaifenesin  4308435322 MG TB12 Take 1 tablet by mouth in the morning and at bedtime.   Semaglutide (OZEMPIC, 0.25 OR 0.5 MG/DOSE, [place]) Inject 0.25 mg into the skin once a week.   tamsulosin (FLOMAX) 0.4 MG CAPS capsule Take 0.4 mg by mouth daily.   tiotropium (SPIRIVA ) 18 MCG inhalation capsule Place 1 capsule (18 mcg total) into inhaler and inhale daily.   torsemide (DEMADEX) 10 MG tablet Take 5-10 mg by mouth as needed for edema.   [DISCONTINUED] aspirin  EC 81 MG tablet Take 1 tablet (81 mg total) by mouth daily. Swallow whole.   [DISCONTINUED] atorvastatin  (LIPITOR) 20 MG tablet Take 1 tablet (20 mg total) by mouth daily.    [DISCONTINUED] metolazone  (ZAROXOLYN ) 2.5 MG tablet Take 1 tablet (2.5 mg total) by mouth daily.   [DISCONTINUED] nitroGLYCERIN  (NITROSTAT ) 0.4 MG SL tablet Place 1 tablet (0.4 mg total) under the tongue every  5 (five) minutes as needed for chest pain.   [DISCONTINUED] potassium chloride  SA (KLOR-CON  M) 20 MEQ tablet Take 2 tablets (40 mEq total) by mouth daily.     Allergies:   Patient has no known allergies.   Social History   Socioeconomic History   Marital status: Single    Spouse name: Not on file   Number of children: Not on file   Years of education: Not on file   Highest education level: Not on file  Occupational History   Not on file  Tobacco Use   Smoking status: Former    Current packs/day: 0.00    Average packs/day: 2.0 packs/day for 31.0 years (62.0 ttl pk-yrs)    Types: Cigarettes    Start date: 30    Quit date: 2018    Years since quitting: 7.0   Smokeless tobacco: Never  Vaping Use   Vaping status: Never Used  Substance and Sexual Activity   Alcohol use: Not Currently   Drug use: Never   Sexual activity: Not on file  Other Topics Concern   Not on file  Social History Narrative   Not on file   Social Drivers of Health   Financial Resource Strain: Not on file  Food Insecurity: Not on file  Transportation Needs: Not on file  Physical Activity: Not on file  Stress: Not on file  Social Connections: Not on file     Family History: The patient's family history includes Breast cancer in his mother; Diabetes in his mother; Emphysema in his maternal grandfather. There is no history of Colon cancer or Esophageal cancer.  ROS:   Please see the history of present illness.    All other systems reviewed and are negative.  EKGs/Labs/Other Studies Reviewed:    The following studies were reviewed today: I discussed my findings with the patient at length   Recent Labs: 01/11/2023: ALT 16; BUN 16; Creatinine, Ser 1.24; Hemoglobin 16.0; Platelets 316.0;  Potassium 4.3; Sodium 138  Recent Lipid Panel    Component Value Date/Time   CHOL 161 02/27/2020 0944   TRIG 91 02/27/2020 0944   HDL 48 02/27/2020 0944   CHOLHDL 3.4 02/27/2020 0944   LDLCALC 96 02/27/2020 0944    Physical Exam:    VS:  BP 105/62   Pulse 100   Ht 6' (1.829 m)   Wt (!) 311 lb 3.2 oz (141.2 kg)   SpO2 92%   BMI 42.21 kg/m     Wt Readings from Last 3 Encounters:  06/23/23 (!) 311 lb 3.2 oz (141.2 kg)  05/24/23 (!) 313 lb 12.8 oz (142.3 kg)  04/26/23 300 lb (136.1 kg)     GEN: Patient is in no acute distress HEENT: Normal NECK: No JVD; No carotid bruits LYMPHATICS: No lymphadenopathy CARDIAC: Hear sounds regular, 2/6 systolic murmur at the apex. RESPIRATORY:  Clear to auscultation without rales, wheezing or rhonchi  ABDOMEN: Soft, non-tender, non-distended MUSCULOSKELETAL:  No edema; No deformity  SKIN: Warm and dry NEUROLOGIC:  Alert and oriented x 3 PSYCHIATRIC:  Normal affect   Signed, Jennifer JONELLE Crape, MD  06/23/2023 10:52 AM    [facility]

## 2023-06-23 NOTE — Patient Instructions (Signed)
Medication Instructions:  Your physician recommends that you continue on your current medications as directed. Please refer to the Current Medication list given to you today.  *If you need a refill on your cardiac medications before your next appointment, please call your pharmacy*   Lab Work: Your physician recommends that you have a CMP, CBC, TSH and lipids today in the office.  If you have labs (blood work) drawn today and your tests are completely normal, you will receive your results only by: MyChart Message (if you have MyChart) OR A paper copy in the mail If you have any lab test that is abnormal or we need to change your treatment, we will call you to review the results.   Testing/Procedures: None ordered   Follow-Up: At [facility], you and your health needs are our priority.  As part of our continuing mission to provide you with exceptional heart care, we have created designated Provider Care Teams.  These Care Teams include your primary Cardiologist (physician) and Advanced Practice Providers (APPs -  Physician Assistants and Nurse Practitioners) who all work together to provide you with the care you need, when you need it.  We recommend signing up for the patient portal called "MyChart".  Sign up information is provided on this After Visit Summary.  MyChart is used to connect with patients for Virtual Visits (Telemedicine).  Patients are able to view lab/test results, encounter notes, upcoming appointments, etc.  Non-urgent messages can be sent to your provider as well.   To learn more about what you can do with MyChart, go to [email or web address].    Your next appointment:   9 month(s)  The format for your next appointment:   In Person  Provider:   Belva Crome, MD    Other Instructions none  Important Information About Sugar

## 2023-06-24 LAB — COMPREHENSIVE METABOLIC PANEL
ALT: 16 [IU]/L (ref 0–44)
AST: 13 [IU]/L (ref 0–40)
Albumin: 4.1 g/dL (ref 3.9–4.9)
Alkaline Phosphatase: 96 [IU]/L (ref 44–121)
BUN/Creatinine Ratio: 16 (ref 10–24)
BUN: 19 mg/dL (ref 8–27)
Bilirubin Total: 0.4 mg/dL (ref 0.0–1.2)
CO2: 23 mmol/L (ref 20–29)
Calcium: 9.1 mg/dL (ref 8.6–10.2)
Chloride: 99 mmol/L (ref 96–106)
Creatinine, Ser: 1.18 mg/dL (ref 0.76–1.27)
Globulin, Total: 2.7 g/dL (ref 1.5–4.5)
Glucose: 96 mg/dL (ref 70–99)
Potassium: 3.4 mmol/L — ABNORMAL LOW (ref 3.5–5.2)
Sodium: 140 mmol/L (ref 134–144)
Total Protein: 6.8 g/dL (ref 6.0–8.5)
eGFR: 69 mL/min/{1.73_m2} (ref >59)

## 2023-06-24 LAB — CBC
Hematocrit: 47.3 % (ref 37.5–51.0)
Hemoglobin: 15.6 g/dL (ref 13.0–17.7)
MCH: 30.4 pg (ref 26.6–33.0)
MCHC: 33 g/dL (ref 31.5–35.7)
MCV: 92 fL (ref 79–97)
Platelets: 311 10*3/uL (ref 150–450)
RBC: 5.14 x10E6/uL (ref 4.14–5.80)
RDW: 12.8 % (ref 11.6–15.4)
WBC: 14.9 10*3/uL — ABNORMAL HIGH (ref 3.4–10.8)

## 2023-06-24 LAB — LIPID PANEL
Chol/HDL Ratio: 3.6 {ratio} (ref 0.0–5.0)
Cholesterol, Total: 148 mg/dL (ref 100–199)
HDL: 41 mg/dL (ref >39)
LDL Chol Calc (NIH): 90 mg/dL (ref 0–99)
Triglycerides: 88 mg/dL (ref 0–149)
VLDL Cholesterol Cal: 17 mg/dL (ref 5–40)

## 2023-06-24 LAB — TSH: TSH: 1.86 u[IU]/mL (ref 0.450–4.500)

## 2023-06-24 NOTE — Telephone Encounter (Signed)
 Received new start paperwork for [facility]. Faxed to [employer] with insurance card copy and clinicals  Phone#: [phone] Fax#: [phone]

## 2023-06-25 ENCOUNTER — Telehealth: Payer: Self-pay

## 2023-06-25 DIAGNOSIS — E876 Hypokalemia: Secondary | ICD-10-CM

## 2023-06-25 DIAGNOSIS — E7800 Pure hypercholesterolemia, unspecified: Secondary | ICD-10-CM

## 2023-06-25 MED ORDER — ATORVASTATIN CALCIUM 40 MG PO TABS: 40.0000 mg | ORAL_TABLET | Freq: Every day | ORAL | 3 refills | Status: DC

## 2023-06-25 NOTE — Telephone Encounter (Signed)
 Increase your Atorvastatin  (Lipitor) to 40 mg daily. You can double your current dose and a new RX has been sent to the pharmacy.  Add foods with potassium in your diet. We need to repeat a BMP in 1 week to see if your potassium has increased to normal.  Your physician recommends that you return for lab work in: 1 week for BMP You can come Monday through Friday 8:30 am to 12:00 pm and 1:15 to 4:30. You do not need to make an appointment as the order has already been placed.   Your physician recommends that you return for lab work in: 6 weeks for LFT's and fasting lipids. You need to have labs done when you are fasting.  You can come Monday through Friday 8:30 am to 12:00 pm and 1:15 to 4:30. You do not need to make an appointment as the order has already been placed.   Potassium-rich foods  Potatoes  Spinach  Sweet potatoes  Banana  Winter squash  Avocados  Broccoli  Lentils  Orange  Yogurt  Raisins  Tomatoes  Vegetables  Prunes  Dried apricots  Legume  Beans  Pumpkins  Swiss chard  Zucchini  Beef  Dairy product  Fish  Beets  Call [phone] for questions or concerns. Melene PEAK

## 2023-06-25 NOTE — Telephone Encounter (Signed)
-----   Message from Aundra Dubin Revankar sent at 06/24/2023  2:36 PM EST ----- double statin.  Encourage a potassium in diet.  Chem-7 in a week.  And then Chem-7 liver lipid check in 6 weeks.  Copy primary Garwin Brothers, MD 06/24/2023 2:35 PM

## 2023-06-29 ENCOUNTER — Telehealth: Payer: Self-pay | Admitting: *Deleted

## 2023-06-29 NOTE — Telephone Encounter (Signed)
 CT Chest Wo Contrast (Accession [id number]) (Order [id number]) Imaging Date: 06/10/2023 Department: [facility] [address] Released By: Jakie Milling Authorizing: Parrett, Milling RAMAN, NP   Exam Status  Status  Final [99]   PACS Intelerad Image Link   Show images for CT Chest Wo Contrast Study Result  Narrative & Impression  CLINICAL DATA:  Acute on chronic shortness of breath, asthma. Pancreatic cancer treated with chemotherapy. Bladder cancer.   EXAM: CT CHEST WITHOUT CONTRAST   TECHNIQUE: Multidetector CT imaging of the chest was performed following the standard protocol without IV contrast.   RADIATION DOSE REDUCTION: This exam was performed according to the departmental dose-optimization program which includes automated exposure control, adjustment of the mA and/or kV according to patient size and/or use of iterative reconstruction technique.   COMPARISON:  09/18/2020.   FINDINGS: Cardiovascular: Atherosclerotic calcification of the aorta. Enlarged pulmonic trunk. Heart is at the upper limits of normal in size. No pericardial effusion. Ascending aorta measures 4.1 cm (4/99), increased minimally from 3.9 cm on 09/18/2020.   Mediastinum/Nodes: No pathologically enlarged mediastinal or axillary lymph nodes. Hilar regions are difficult to definitively evaluate without IV contrast. Esophagus is grossly unremarkable.   Lungs/Pleura: Centrilobular emphysema. Scarring in the lingula and both lower lobes. No suspicious pulmonary nodules. No pleural fluid. Airway is unremarkable.   Upper Abdomen: Visualized portions of the liver, gallbladder, adrenal glands, spleen, pancreas, stomach and bowel are grossly unremarkable. No upper abdominal adenopathy.   Musculoskeletal: Degenerative changes in the spine. Old right rib fractures.   IMPRESSION: 1. No findings to explain the patient's acute on chronic shortness of breath. 2. 4.1 cm ascending aortic  aneurysm. Recommend annual imaging followup by CTA or MRA. This recommendation follows 2010 ACCF/AHA/AATS/ACR/ASA/SCA/SCAI/SIR/STS/SVM Guidelines for the Diagnosis and Management of Patients with Thoracic Aortic Disease. Circulation. 2010; 121: Z733-z630. Aortic aneurysm NOS (ICD10-I71.9). 3.  Aortic atherosclerosis (ICD10-I70.0). 4. Enlarged pulmonic trunk, indicative of pulmonary arterial hypertension. 5.  Emphysema (ICD10-J43.9).     Electronically Signed   By: Newell Eke M.D.   On: 06/23/2023 11:13

## 2023-06-30 NOTE — Telephone Encounter (Signed)
 Received fax from [employer]. Patient's [place]  rx will be triaged to [facility] [phone]). Once ready to schedule shipment, pharmacy will reach out to patient. IF rx is unaffordable and patient expresses inability to afford medication, patient's case will be referred back to [facility] for patient assistance program pre-screening  Patient ID: [id number]  Geraldene Kleine, PharmD, MPH, BCPS, CPP Clinical Pharmacist (Rheumatology and Pulmonology)

## 2023-07-06 NOTE — Telephone Encounter (Signed)
Received fax from [facility]. PA is required. Submitted via CMM  Key: [id number]  Pharmacy phone: [phone] Fax phone: [phone]  [name], PharmD, MPH, BCPS, CPP Clinical Pharmacist (Rheumatology and Pulmonology)

## 2023-07-07 ENCOUNTER — Other Ambulatory Visit (HOSPITAL_COMMUNITY): Payer: Self-pay | Admitting: Pharmacy Technician

## 2023-07-07 ENCOUNTER — Other Ambulatory Visit (HOSPITAL_COMMUNITY): Payer: Self-pay

## 2023-07-07 NOTE — Telephone Encounter (Signed)
Received notification from [facility] MEDICAID regarding a prior authorization for [facility]. Authorization has been APPROVED from 07/06/2023 to 07/05/2024. Approval letter sent to scan center.  Authorization # [id number]  [name], PharmD, MPH, BCPS, CPP Clinical Pharmacist (Rheumatology and Pulmonology)

## 2023-07-07 NOTE — Progress Notes (Signed)
Specialty Pharmacy Refill Coordination Note  Taylor Vargas is a 63 y.o. male contacted today regarding refills of specialty medication(s) Dupilumab (Dupixent)   Patient requested Delivery   Delivery date: 07/13/23   Verified address: Patient address [address]  [name]   Medication will be filled on 07/12/23.

## 2023-07-12 ENCOUNTER — Other Ambulatory Visit: Payer: Self-pay

## 2023-07-12 NOTE — Telephone Encounter (Signed)
Received fax from [facility] that patient's Taylor Vargas is ready for scheduling. Patient's estimated copay is $4. Spoke with patient and provided him the pharmacy phone [phone]) to schedule shipment to home. He verbalized undesrtanding  [name], PharmD, MPH, BCPS, CPP Clinical Pharmacist (Rheumatology and Pulmonology)

## 2023-07-20 ENCOUNTER — Other Ambulatory Visit: Payer: Self-pay | Admitting: Pulmonary Disease

## 2023-07-20 DIAGNOSIS — J4489 Other specified chronic obstructive pulmonary disease: Secondary | ICD-10-CM

## 2023-08-04 ENCOUNTER — Other Ambulatory Visit: Payer: Self-pay

## 2023-08-04 ENCOUNTER — Other Ambulatory Visit: Payer: Self-pay | Admitting: Pulmonary Disease

## 2023-08-04 NOTE — Progress Notes (Signed)
Specialty Pharmacy Refill Coordination Note  Taylor Vargas is a 63 y.o. male contacted today regarding refills of specialty medication(s) Dupilumab (Dupixent)   Patient requested Delivery   Delivery date: 08/10/23   Verified address: [address] [name]   Medication will be filled on 08/09/2023.

## 2023-08-14 DIAGNOSIS — J449 Chronic obstructive pulmonary disease, unspecified: Secondary | ICD-10-CM | POA: Diagnosis not present

## 2023-08-14 DIAGNOSIS — J9611 Chronic respiratory failure with hypoxia: Secondary | ICD-10-CM | POA: Diagnosis not present

## 2023-08-25 ENCOUNTER — Other Ambulatory Visit (HOSPITAL_COMMUNITY): Payer: Self-pay

## 2023-08-26 ENCOUNTER — Other Ambulatory Visit (HOSPITAL_COMMUNITY): Payer: Self-pay

## 2023-08-30 ENCOUNTER — Telehealth: Payer: Self-pay | Admitting: Pulmonary Disease

## 2023-08-30 ENCOUNTER — Other Ambulatory Visit: Payer: Self-pay

## 2023-08-30 DIAGNOSIS — J4489 Other specified chronic obstructive pulmonary disease: Secondary | ICD-10-CM

## 2023-08-30 MED ORDER — DUPIXENT 300 MG/2ML ~~LOC~~ SOAJ
300.0000 mg | SUBCUTANEOUS | 0 refills | Status: AC
Start: 2023-08-30 — End: ?
  Filled 2023-08-30: qty 4, 28d supply, fill #0
  Filled 2023-10-01: qty 4, 28d supply, fill #1
  Filled 2023-10-25: qty 4, 28d supply, fill #2

## 2023-08-30 NOTE — Progress Notes (Signed)
 Specialty Pharmacy Refill Coordination Note  Taylor Vargas is a 63 y.o. male contacted today regarding refills of specialty medication(s) Dupilumab (Dupixent)   Patient requested Delivery   Delivery date: 09/07/23   Verified address: [address] [name]   Medication will be filled on 03.24.25.

## 2023-08-30 NOTE — Telephone Encounter (Signed)
 Refill sent for DUPIXENT to [facility]: [phone]   Dose: 300mg  subcut every 14 days  Last OV: 05/24/2023 Provider: Dr. Francine Graven  Next OV: due (not yet scheduled)  Routing to scheduling team for follow-up on appt scheduling  [name], PharmD, MPH, BCPS Clinical Pharmacist (Rheumatology and Pulmonology)

## 2023-09-06 ENCOUNTER — Other Ambulatory Visit (HOSPITAL_COMMUNITY): Payer: Self-pay

## 2023-09-06 ENCOUNTER — Other Ambulatory Visit: Payer: Self-pay

## 2023-09-06 ENCOUNTER — Telehealth: Payer: Self-pay

## 2023-09-06 DIAGNOSIS — Z79899 Other long term (current) drug therapy: Secondary | ICD-10-CM | POA: Diagnosis not present

## 2023-09-06 DIAGNOSIS — M1711 Unilateral primary osteoarthritis, right knee: Secondary | ICD-10-CM | POA: Diagnosis not present

## 2023-09-06 DIAGNOSIS — G894 Chronic pain syndrome: Secondary | ICD-10-CM | POA: Diagnosis not present

## 2023-09-06 DIAGNOSIS — G8929 Other chronic pain: Secondary | ICD-10-CM | POA: Diagnosis not present

## 2023-09-06 DIAGNOSIS — J449 Chronic obstructive pulmonary disease, unspecified: Secondary | ICD-10-CM | POA: Diagnosis not present

## 2023-09-06 DIAGNOSIS — M25562 Pain in left knee: Secondary | ICD-10-CM | POA: Diagnosis not present

## 2023-09-06 DIAGNOSIS — C679 Malignant neoplasm of bladder, unspecified: Secondary | ICD-10-CM | POA: Diagnosis not present

## 2023-09-06 DIAGNOSIS — M1712 Unilateral primary osteoarthritis, left knee: Secondary | ICD-10-CM | POA: Diagnosis not present

## 2023-09-06 DIAGNOSIS — M25561 Pain in right knee: Secondary | ICD-10-CM | POA: Diagnosis not present

## 2023-09-06 DIAGNOSIS — R269 Unspecified abnormalities of gait and mobility: Secondary | ICD-10-CM | POA: Diagnosis not present

## 2023-09-06 NOTE — Telephone Encounter (Signed)
 Received notification from [facility] regarding a prior authorization for DUPIXENT. Authorization has been APPROVED from 09/06/2023 to 03/08/2024. Approval letter sent to scan center.  Authorization # [id number]  Per Karolee Stamps at [facility], claim went through for $0.  Patient can continue to fill through [facility]: [phone]. Nothing further needed at this time.

## 2023-09-06 NOTE — Telephone Encounter (Signed)
 Received notification from [facility] that pt has enrolled into Medicare and requires a new PA.  Submitted a Prior Authorization request to [facility] for DUPIXENT via CoverMyMeds. Will update once we receive a response.  Key: Taylor Vargas

## 2023-09-09 ENCOUNTER — Telehealth: Payer: Self-pay | Admitting: Cardiology

## 2023-09-09 NOTE — Telephone Encounter (Signed)
   Patient Name: Taylor Vargas  DOB: 01-24-1961 MRN: [id number]  Primary Cardiologist: None  Chart reviewed as part of pre-operative protocol coverage. Pre-op clearance already addressed by colleagues in earlier phone notes. To summarize recommendations:  - He had normal coronaries in 2020.  If his symptom profile is not changed then he should not be high risk.  Trental can be held for sure.  As appropriate by the procedure position  -Dr. Tomie China  No additional CV testing needing at this time.  Will route this bundled recommendation to requesting provider via Epic fax function and remove from pre-op pool. Please call with questions.  Sharlene Dory, PA-C 09/09/2023, 4:36 PM

## 2023-09-09 NOTE — Telephone Encounter (Signed)
   Pre-operative Risk Assessment    Patient Name: Taylor Vargas  DOB: August 18, 1960 MRN: [id number]   Date of last office visit:06/23/2023  Date of next office visit: 9 month F/U due 10/25   Request for Surgical Clearance    Procedure:   Genicular Nerve block in right knee  Date of Surgery:  Clearance 10/07/23                                Surgeon:  Dr. Rolene Course Surgeon's Group or Practice Name:  [facility] Phone number:  [phone]  Fax number:  [phone]   Type of Clearance Requested:   - Medical  - Pharmacy:  Hold Pentoxifylline([facility])      Type of Anesthesia:  None    Additional requests/questions:    Sander Nephew   09/09/2023, 11:50 AM

## 2023-09-14 DIAGNOSIS — J9611 Chronic respiratory failure with hypoxia: Secondary | ICD-10-CM | POA: Diagnosis not present

## 2023-09-14 DIAGNOSIS — J449 Chronic obstructive pulmonary disease, unspecified: Secondary | ICD-10-CM | POA: Diagnosis not present

## 2023-09-15 ENCOUNTER — Other Ambulatory Visit: Payer: Self-pay | Admitting: Pulmonary Disease

## 2023-09-15 DIAGNOSIS — H25813 Combined forms of age-related cataract, bilateral: Secondary | ICD-10-CM | POA: Diagnosis not present

## 2023-09-17 DIAGNOSIS — Z01818 Encounter for other preprocedural examination: Secondary | ICD-10-CM | POA: Diagnosis not present

## 2023-09-17 DIAGNOSIS — E876 Hypokalemia: Secondary | ICD-10-CM | POA: Diagnosis not present

## 2023-09-17 DIAGNOSIS — H52221 Regular astigmatism, right eye: Secondary | ICD-10-CM | POA: Diagnosis not present

## 2023-09-17 DIAGNOSIS — E7800 Pure hypercholesterolemia, unspecified: Secondary | ICD-10-CM | POA: Diagnosis not present

## 2023-09-17 DIAGNOSIS — H25811 Combined forms of age-related cataract, right eye: Secondary | ICD-10-CM | POA: Diagnosis not present

## 2023-09-18 LAB — LIPID PANEL
Chol/HDL Ratio: 3.4 ratio (ref 0.0–5.0)
Cholesterol, Total: 156 mg/dL (ref 100–199)
HDL: 46 mg/dL (ref >39)
LDL Chol Calc (NIH): 92 mg/dL (ref 0–99)
Triglycerides: 94 mg/dL (ref 0–149)
VLDL Cholesterol Cal: 18 mg/dL (ref 5–40)

## 2023-09-18 LAB — BASIC METABOLIC PANEL WITH GFR
BUN/Creatinine Ratio: 11 (ref 10–24)
BUN: 12 mg/dL (ref 8–27)
CO2: 24 mmol/L (ref 20–29)
Calcium: 9.7 mg/dL (ref 8.6–10.2)
Chloride: 93 mmol/L — ABNORMAL LOW (ref 96–106)
Creatinine, Ser: 1.08 mg/dL (ref 0.76–1.27)
Glucose: 122 mg/dL — ABNORMAL HIGH (ref 70–99)
Potassium: 3.8 mmol/L (ref 3.5–5.2)
Sodium: 137 mmol/L (ref 134–144)
eGFR: 76 mL/min/{1.73_m2} (ref >59)

## 2023-09-18 LAB — HEPATIC FUNCTION PANEL
ALT: 22 IU/L (ref 0–44)
AST: 17 IU/L (ref 0–40)
Albumin: 4.2 g/dL (ref 3.9–4.9)
Alkaline Phosphatase: 105 IU/L (ref 44–121)
Bilirubin Total: 0.4 mg/dL (ref 0.0–1.2)
Bilirubin, Direct: 0.16 mg/dL (ref 0.00–0.40)
Total Protein: 7 g/dL (ref 6.0–8.5)

## 2023-09-22 DIAGNOSIS — H2189 Other specified disorders of iris and ciliary body: Secondary | ICD-10-CM | POA: Diagnosis not present

## 2023-09-22 DIAGNOSIS — H2511 Age-related nuclear cataract, right eye: Secondary | ICD-10-CM | POA: Diagnosis not present

## 2023-09-22 DIAGNOSIS — H25811 Combined forms of age-related cataract, right eye: Secondary | ICD-10-CM | POA: Diagnosis not present

## 2023-09-22 DIAGNOSIS — H5703 Miosis: Secondary | ICD-10-CM | POA: Diagnosis not present

## 2023-09-22 DIAGNOSIS — H269 Unspecified cataract: Secondary | ICD-10-CM | POA: Diagnosis not present

## 2023-09-22 DIAGNOSIS — H52201 Unspecified astigmatism, right eye: Secondary | ICD-10-CM | POA: Diagnosis not present

## 2023-09-22 DIAGNOSIS — H2181 Floppy iris syndrome: Secondary | ICD-10-CM | POA: Diagnosis not present

## 2023-09-29 ENCOUNTER — Other Ambulatory Visit: Payer: Self-pay

## 2023-09-29 DIAGNOSIS — G4733 Obstructive sleep apnea (adult) (pediatric): Secondary | ICD-10-CM | POA: Diagnosis not present

## 2023-10-01 ENCOUNTER — Other Ambulatory Visit: Payer: Self-pay | Admitting: Pharmacy Technician

## 2023-10-01 ENCOUNTER — Other Ambulatory Visit: Payer: Self-pay

## 2023-10-01 NOTE — Progress Notes (Signed)
 Specialty Pharmacy Refill Coordination Note  Taylor Vargas is a 63 y.o. male contacted today regarding refills of specialty medication(s) Dupilumab  (Dupixent )   Patient requested Delivery   Delivery date: 10/05/23   Verified address: [address] [place]   Medication will be filled on 10/04/23.

## 2023-10-04 ENCOUNTER — Other Ambulatory Visit: Payer: Self-pay

## 2023-10-04 DIAGNOSIS — C672 Malignant neoplasm of lateral wall of bladder: Secondary | ICD-10-CM | POA: Diagnosis not present

## 2023-10-07 DIAGNOSIS — M170 Bilateral primary osteoarthritis of knee: Secondary | ICD-10-CM | POA: Diagnosis not present

## 2023-10-10 ENCOUNTER — Telehealth: Payer: Self-pay | Admitting: Pharmacist

## 2023-10-10 NOTE — Telephone Encounter (Signed)
 Received fax from [facility] that rx for Ohtuvayre  is being redirected to [facility]  Geraldene Kleine, PharmD, MPH, BCPS, CPP Clinical Pharmacist (Rheumatology and Pulmonology)

## 2023-10-11 DIAGNOSIS — M1712 Unilateral primary osteoarthritis, left knee: Secondary | ICD-10-CM | POA: Diagnosis not present

## 2023-10-11 DIAGNOSIS — T402X5A Adverse effect of other opioids, initial encounter: Secondary | ICD-10-CM | POA: Diagnosis not present

## 2023-10-11 DIAGNOSIS — R269 Unspecified abnormalities of gait and mobility: Secondary | ICD-10-CM | POA: Diagnosis not present

## 2023-10-11 DIAGNOSIS — C679 Malignant neoplasm of bladder, unspecified: Secondary | ICD-10-CM | POA: Diagnosis not present

## 2023-10-11 DIAGNOSIS — M25562 Pain in left knee: Secondary | ICD-10-CM | POA: Diagnosis not present

## 2023-10-11 DIAGNOSIS — G894 Chronic pain syndrome: Secondary | ICD-10-CM | POA: Diagnosis not present

## 2023-10-11 DIAGNOSIS — M25561 Pain in right knee: Secondary | ICD-10-CM | POA: Diagnosis not present

## 2023-10-11 DIAGNOSIS — M1711 Unilateral primary osteoarthritis, right knee: Secondary | ICD-10-CM | POA: Diagnosis not present

## 2023-10-11 DIAGNOSIS — J449 Chronic obstructive pulmonary disease, unspecified: Secondary | ICD-10-CM | POA: Diagnosis not present

## 2023-10-11 DIAGNOSIS — G8929 Other chronic pain: Secondary | ICD-10-CM | POA: Diagnosis not present

## 2023-10-11 DIAGNOSIS — K5903 Drug induced constipation: Secondary | ICD-10-CM | POA: Diagnosis not present

## 2023-10-13 DIAGNOSIS — H25812 Combined forms of age-related cataract, left eye: Secondary | ICD-10-CM | POA: Diagnosis not present

## 2023-10-13 DIAGNOSIS — H269 Unspecified cataract: Secondary | ICD-10-CM | POA: Diagnosis not present

## 2023-10-13 DIAGNOSIS — H5703 Miosis: Secondary | ICD-10-CM | POA: Diagnosis not present

## 2023-10-13 DIAGNOSIS — H52202 Unspecified astigmatism, left eye: Secondary | ICD-10-CM | POA: Diagnosis not present

## 2023-10-13 DIAGNOSIS — H2512 Age-related nuclear cataract, left eye: Secondary | ICD-10-CM | POA: Diagnosis not present

## 2023-10-14 DIAGNOSIS — J9611 Chronic respiratory failure with hypoxia: Secondary | ICD-10-CM | POA: Diagnosis not present

## 2023-10-14 DIAGNOSIS — J449 Chronic obstructive pulmonary disease, unspecified: Secondary | ICD-10-CM | POA: Diagnosis not present

## 2023-10-18 ENCOUNTER — Other Ambulatory Visit: Payer: Self-pay | Admitting: Pulmonary Disease

## 2023-10-22 DIAGNOSIS — Z0000 Encounter for general adult medical examination without abnormal findings: Secondary | ICD-10-CM | POA: Diagnosis not present

## 2023-10-22 DIAGNOSIS — Z131 Encounter for screening for diabetes mellitus: Secondary | ICD-10-CM | POA: Diagnosis not present

## 2023-10-22 LAB — LAB REPORT - SCANNED
A1c: 6.2
EGFR: 57

## 2023-10-25 ENCOUNTER — Other Ambulatory Visit: Payer: Self-pay

## 2023-10-25 NOTE — Progress Notes (Signed)
 Specialty Pharmacy Ongoing Clinical Assessment Note  Taylor Vargas is a 63 y.o. male who is being followed by the specialty pharmacy service for RxSp Asthma/COPD   Patient's specialty medication(s) reviewed today: Dupilumab  (Dupixent )   Missed doses in the last 4 weeks: 0   Patient/Caregiver did not have any additional questions or concerns.   Therapeutic benefit summary: Patient is achieving benefit   Adverse events/side effects summary: No adverse events/side effects   Patient's therapy is appropriate to: Continue    Goals Addressed             This Visit's Progress    Reduce disease symptoms including coughing and shortness of breath   On track    Patient is on track. Patient will maintain adherence. Patient reports having some congestion in the morning but attributes it to seasonal allergies, otherwise he states breathing is much better.         Follow up: 6 months  [facility]

## 2023-10-25 NOTE — Progress Notes (Signed)
 Specialty Pharmacy Refill Coordination Note  Taylor Vargas is a 63 y.o. male contacted today regarding refills of specialty medication(s) Dupilumab  (Dupixent )   Patient requested Delivery   Delivery date: 11/05/23   Verified address: [address] [place]   Medication will be filled on 05.22.25.

## 2023-10-26 ENCOUNTER — Ambulatory Visit (INDEPENDENT_AMBULATORY_CARE_PROVIDER_SITE_OTHER): Admitting: Pulmonary Disease

## 2023-10-26 ENCOUNTER — Encounter: Payer: Self-pay | Admitting: Pulmonary Disease

## 2023-10-26 VITALS — BP 95/63 | HR 100 | Ht 72.0 in | Wt 309.0 lb

## 2023-10-26 DIAGNOSIS — Z87891 Personal history of nicotine dependence: Secondary | ICD-10-CM

## 2023-10-26 DIAGNOSIS — J9611 Chronic respiratory failure with hypoxia: Secondary | ICD-10-CM

## 2023-10-26 DIAGNOSIS — G4733 Obstructive sleep apnea (adult) (pediatric): Secondary | ICD-10-CM

## 2023-10-26 DIAGNOSIS — J45909 Unspecified asthma, uncomplicated: Secondary | ICD-10-CM | POA: Diagnosis not present

## 2023-10-26 DIAGNOSIS — J329 Chronic sinusitis, unspecified: Secondary | ICD-10-CM

## 2023-10-26 DIAGNOSIS — J4489 Other specified chronic obstructive pulmonary disease: Secondary | ICD-10-CM

## 2023-10-26 NOTE — Progress Notes (Addendum)
 Synopsis: Referred in March 2024 for Asthma-COPD Overlap Syndrome and OSA previously followed by Dr. Shellia and Dr. McQuaid.  Subjective:   PATIENT ID: Taylor Vargas GENDER: male DOB: [birth year], MRN: [id number]  HPI  Chief Complaint  Patient presents with   Follow-up   Taylor Vargas is a 63 year old male, former smoker with obesity, chronic diastolic heart failure, GERD, OSA on NIV, pancreatic cancer s/p chemotherapy, chronic sinusitis and asthma-COPD overlap syndrome who returns to pulmonary clinic for follow up.   Initial OV visit 09/02/22 Patient started Xolair  therapy 06/19/2022 and received his second dose on 06/30/2022 and third dose on 07/17/22. He last saw Dr. McQuaid on 06/02/22.  He is taking montelukast  10mg  daily, fluticasone  and azelastine  nasal sprays and symbicort  160-4.78mcg 2 puffs twice daily and spiriva  handihaler daily.   He has not noticed much improvement in his breathing since starting xolair .   He reports the spring pollen is really bothering his breathing currently and significant sinus congestion. He has fullness in his ears. He has sinus pressure.He has increased wheezing.   IgE level 592 04/15/22.   He has a 62 pack year smoking history and quit smoking in 2018. He is using home vent on iVAPS mode with good compliance based on today's download.   OV 01/11/23 He was started on dupixent  09/11/2022.   He reports significant fatigue. He continues to have significant sinus drainage.   OV 10/26/23 He was seen by Madelin Stank, NP 05/24/23 and started on ensifentrine  nebulizer treatments twice daily. He reports improvement in his  breathing since starting the new medication. He reports receiving a letter from his pharmacy that we will no longer be getting ensifentrine  from them.   He continues to have issues with his knees and has pain management procedure coming up which will hopefully give him significant relief.     Past Medical History:  Diagnosis Date   Abnormal glucose  08/27/2015   Last Assessment & Plan:  Relevant Hx: Course: Daily Update: Today's Plan:will get an a1c especially given his increased urinary frequency  Electronically signed by: [place] Dariel Sayres, FNP 08/27/15 1513   Arthritis    Benign prostatic hyperplasia with lower urinary tract symptoms 08/27/2015   Last Assessment & Plan:  Relevant Hx: Course: Daily Update: Today's Plan:taking flomax for this, but his was presumed, no workup for this diagnosis  Electronically signed by: [place] Dariel Sayres, FNP 08/27/15 1513   Chest tightness 11/08/2018   Chronic diastolic heart failure (HCC) 04/21/2019   Chronic obstructive pulmonary disease (HCC) 08/27/2015   Last Assessment & Plan:  Relevant Hx: Course: Daily Update: Today's Plan:this is likely in acute exacerbation. I have discussed with him that I would like to et cxr to rule out acute pathology. He finally agrees to this. His sat came up to 89% on RA He understands s/e's of the prednisone  and if he does have an element of diabetes or prediabtes this could make that worse for him  Electronically sig   Chronic pain of both knees 01/04/2020   Formatting of this note might be different from the original. Added automatically from request for surgery [id number]   Congenital absence of one kidney    01-04-2018 per pt unsure which one   COPD (chronic obstructive pulmonary disease) (HCC)    pulmologist-  dr mardee jennye)   Coronary artery calcification seen on CAT scan 11/30/2019   GERD (gastroesophageal reflux disease)    History of colon polyps 04/26/2023   Hypercholesterolemia 09/10/2015  Hyperglycemia 09/10/2015   Malaise and fatigue 08/27/2015   Last Assessment & Plan:  Relevant Hx: Course: Daily Update: Today's Plan:will get labs to rule out acute pathology   Electronically signed by: [place] Dariel Sayres, FNP 08/27/15 1513   Morbid obesity (HCC) 11/02/2018   On supplemental oxygen  therapy    01-04-2018  per pt prescribed as needed ,   states checks oxygen  level daily,  and average between 88-90% on room air,  per pt has not needed O2 past 4 months   OSA on CPAP    Osteoarthritis of right knee 01/11/2020   Pain in left knee 12/28/2017   Pancreatic cancer (HCC)    01-04-2018  per pt dx 10/ 2009 found by CT imaging, no surgery,  treatment chemotherapy  @ [facility],  in remission since 2017   Solitary thyroid nodule 09/10/2015     Family History  Problem Relation Age of Onset   Diabetes Mother    Breast cancer Mother    Emphysema Maternal Grandfather    Colon cancer Neg Hx    Esophageal cancer Neg Hx      Social History   Socioeconomic History   Marital status: Single    Spouse name: Not on file   Number of children: Not on file   Years of education: Not on file   Highest education level: Not on file  Occupational History   Not on file  Tobacco Use   Smoking status: Former    Current packs/day: 0.00    Average packs/day: 2.0 packs/day for 31.0 years (62.0 ttl pk-yrs)    Types: Cigarettes    Start date: 64    Quit date: 2018    Years since quitting: 7.3   Smokeless tobacco: Never  Vaping Use   Vaping status: Never Used  Substance and Sexual Activity   Alcohol use: Not Currently   Drug use: Never   Sexual activity: Not on file  Other Topics Concern   Not on file  Social History Narrative   Not on file   Social Drivers of Health   Financial Resource Strain: Not on file  Food Insecurity: Not on file  Transportation Needs: Not on file  Physical Activity: Not on file  Stress: Not on file  Social Connections: Not on file  Intimate Partner Violence: Not on file     No Known Allergies   Outpatient Medications Prior to Visit  Medication Sig Dispense Refill   albuterol  (ACCUNEB ) 0.63 MG/3ML nebulizer solution USE 1 VIAL VIA NEBULIZER EVERY 6 HOURS AS NEEDED FOR WHEEZING OR SHORTNESS OF BREATH 300 mL 2   albuterol  (VENTOLIN  HFA) 108 (90 Base) MCG/ACT inhaler INHALE 2 PUFFS INTO THE LUNGS EVERY 4 HOURS  AS NEEDED FOR WHEEZING OR SHORTNESS OF BREATH 54 g 1   allopurinol (ZYLOPRIM) 100 MG tablet Take 100 mg by mouth daily.     ALPRAZolam (XANAX) 0.25 MG tablet Take 0.25 mg by mouth 3 (three) times daily as needed for anxiety.     aspirin  EC 81 MG tablet Take 1 tablet (81 mg total) by mouth daily. Swallow whole. 90 tablet 3   atorvastatin  (LIPITOR) 40 MG tablet Take 1 tablet (40 mg total) by mouth daily. 90 tablet 3   azelastine  (ASTELIN ) 0.1 % nasal spray Place 1 spray into both nostrils 2 (two) times daily as needed for rhinitis. 3 mL 3   budesonide -formoterol  (SYMBICORT ) 160-4.5 MCG/ACT inhaler Inhale 2 puffs into the lungs 2 (two) times daily. 3 each 3  cyanocobalamin (VITAMIN B12) 1000 MCG/ML injection Inject 1,000 mcg into the muscle every 30 (thirty) days.     diclofenac Sodium (VOLTAREN) 1 % GEL Apply 1 application topically as needed for pain.     Dupilumab  (DUPIXENT ) 300 MG/2ML SOAJ Inject 300 mg into the skin every 14 (fourteen) days. 12 mL 0   Ensifentrine  (OHTUVAYRE ) 3 MG/2.5ML SUSP Inhale 1 each into the lungs 2 (two) times daily. 150 mL 5   EPINEPHrine  (EPIPEN  2-PAK) 0.3 mg/0.3 mL IJ SOAJ injection Inject 0.3 mg into the muscle as needed for anaphylaxis. 2 each 5   famotidine (PEPCID) 40 MG tablet Take 40 mg by mouth daily as needed for heartburn or indigestion.     fluticasone  (FLONASE ) 50 MCG/ACT nasal spray Place 1 spray into both nostrils 2 (two) times daily as needed for allergies or rhinitis. 3 g 3   HYDROcodone -acetaminophen  (NORCO/VICODIN) 5-325 MG tablet Take 1 tablet by mouth as needed for pain.     ibuprofen (ADVIL) 200 MG tablet Take 200 mg by mouth every 6 (six) hours as needed for pain.     ketorolac (TORADOL) 30 MG/ML injection Inject 1 mL into the muscle 2 (two) times a week.     lidocaine  (LIDODERM ) 5 % Place 1 patch onto the skin daily.     LORazepam (ATIVAN) 2 MG tablet Take 1-2 mg by mouth every 8 (eight) hours as needed for anxiety.     metolazone  (ZAROXOLYN ) 2.5  MG tablet Take 1 tablet (2.5 mg total) by mouth daily. 90 tablet 3   montelukast  (SINGULAIR ) 10 MG tablet Take 1 tablet (10 mg total) by mouth at bedtime. 90 tablet 3   mupirocin ointment (BACTROBAN) 2 % Apply 1 application topically 2 (two) times daily.     Na Sulfate-K Sulfate-Mg Sulf 17.5-3.13-1.6 GM/177ML SOLN Use as directed; may use generic; goodrx card if insurance will not cover generic 354 mL 0   naloxone (NARCAN) nasal spray 4 mg/0.1 mL Place 1 spray into the nose as needed for opioid reversal.     nitroGLYCERIN  (NITROSTAT ) 0.4 MG SL tablet Place 1 tablet (0.4 mg total) under the tongue every 5 (five) minutes as needed for chest pain. 25 tablet 11   NYSTATIN powder Apply 1 application. topically 2 (two) times daily.     omeprazole (PRILOSEC) 20 MG capsule Take 20 mg by mouth 2 (two) times daily.     pentoxifylline (TRENTAL) 400 MG CR tablet Take 400 mg by mouth 2 (two) times daily.     potassium chloride  SA (KLOR-CON  M) 20 MEQ tablet Take 2 tablets (40 mEq total) by mouth daily. 180 tablet 3   Pseudoephedrine -Guaifenesin  [phone] MG TB12 Take 1 tablet by mouth in the morning and at bedtime. 30 tablet 1   Semaglutide (OZEMPIC, 0.25 OR 0.5 MG/DOSE, [place]) Inject 0.25 mg into the skin once a week.     SPIRIVA  HANDIHALER 18 MCG inhalation capsule INHALE 1 CAPSULE BY MOUTH DAILY 90 capsule 3   tamsulosin (FLOMAX) 0.4 MG CAPS capsule Take 0.4 mg by mouth daily.     torsemide (DEMADEX) 10 MG tablet Take 5-10 mg by mouth as needed for edema.     No facility-administered medications prior to visit.   Review of Systems  Constitutional:  Negative for chills, fever, malaise/fatigue and weight loss.  HENT:  Positive for congestion. Negative for sinus pain and sore throat.   Eyes: Negative.   Respiratory:  Positive for cough, shortness of breath and wheezing. Negative for hemoptysis and  sputum production.   Cardiovascular:  Negative for chest pain, palpitations, orthopnea, claudication and leg  swelling.  Gastrointestinal:  Negative for abdominal pain, heartburn, nausea and vomiting.  Genitourinary: Negative.   Musculoskeletal:  Negative for joint pain and myalgias.  Skin:  Negative for rash.  Neurological:  Negative for weakness.  Endo/Heme/Allergies:  Positive for environmental allergies.  Psychiatric/Behavioral: Negative.     Objective:   Vitals:   10/26/23 1007  BP: 95/63  Pulse: 100  SpO2: 94%  Weight: (!) 309 lb (140.2 kg)  Height: 6' (1.829 m)    Physical Exam Constitutional:      General: He is not in acute distress.    Appearance: He is obese.  HENT:     Head: Normocephalic and atraumatic.     Nose: Congestion present.  Eyes:     Extraocular Movements: Extraocular movements intact.     Conjunctiva/sclera: Conjunctivae normal.     Pupils: Pupils are equal, round, and reactive to light.  Cardiovascular:     Rate and Rhythm: Normal rate and regular rhythm.     Pulses: Normal pulses.     Heart sounds: Normal heart sounds. No murmur heard. Pulmonary:     Breath sounds: No wheezing, rhonchi or rales.  Abdominal:     General: Bowel sounds are normal.     Palpations: Abdomen is soft.  Musculoskeletal:     Right lower leg: No edema.     Left lower leg: No edema.  Skin:    General: Skin is warm and dry.  Neurological:     General: No focal deficit present.     Mental Status: He is alert.    CBC    Component Value Date/Time   WBC 14.9 (H) 06/23/2023 1059   WBC 14.9 (H) 01/11/2023 1102   RBC 5.14 06/23/2023 1059   RBC 5.22 01/11/2023 1102   HGB 15.6 06/23/2023 1059   HCT 47.3 06/23/2023 1059   PLT 311 06/23/2023 1059   MCV 92 06/23/2023 1059   MCH 30.4 06/23/2023 1059   MCH 29.9 04/23/2020 1145   MCHC 33.0 06/23/2023 1059   MCHC 32.3 01/11/2023 1102   RDW 12.8 06/23/2023 1059   LYMPHSABS 3.4 01/11/2023 1102   LYMPHSABS 2.0 11/17/2018 1023   MONOABS 1.1 (H) 01/11/2023 1102   EOSABS 0.3 01/11/2023 1102   EOSABS 0.3 11/17/2018 1023   BASOSABS  0.1 01/11/2023 1102   BASOSABS 0.0 11/17/2018 1023      Latest Ref Rng & Units 09/17/2023   11:24 AM 06/23/2023   10:59 AM 01/11/2023   11:02 AM  BMP  Glucose 70 - 99 mg/dL 877  96  97   BUN 8 - 27 mg/dL 12  19  16    Creatinine 0.76 - 1.27 mg/dL 8.91  8.81  8.75   BUN/Creat Ratio 10 - 24 11  16     Sodium 134 - 144 mmol/L 137  140  138   Potassium 3.5 - 5.2 mmol/L 3.8  3.4  4.3   Chloride 96 - 106 mmol/L 93  99  97   CO2 20 - 29 mmol/L 24  23  32   Calcium  8.6 - 10.2 mg/dL 9.7  9.1  89.9    Chest imaging: CT Chest 09/18/20 Mediastinum/Nodes: No enlarged mediastinal or axillary lymph nodes. Thyroid gland, trachea, and esophagus demonstrate no significant findings.   Lungs/Pleura: Centrilobular emphysema and diffuse bronchial wall thickening. Lingular scarring with volume loss. Posterior and posterolateral right lower lobe scarring with volume  loss. No superimposed airspace consolidation, atelectasis, or pneumothorax. Right upper lobe pulmonary nodule has resolved in the interval. Previous right apical has also resolved. No new or suspicious lung nodules identified.  PFT:    11/22/2019   10:43 AM  PFT Results  TLC 5.55         This result is from an external source.   Labs:  Path:  Echo:  Heart Catheterization:   Assessment & Plan:   Asthma-COPD overlap syndrome (HCC)  OSA (obstructive sleep apnea)  Chronic respiratory failure with hypoxia (HCC)  Chronic sinusitis, unspecified location  Discussion: Taylor Vargas is a 63 year old male, former smoker with obesity, chronic diastolic heart failure, GERD, OSA on NIV, pancreatic cancer s/p chemotherapy, chronic sinusitis and asthma-COPD overlap syndrome who returns to pulmonary clinic for follow up.  He is to continue symbicort  and spiriva  along with flonase  and azelastine  nasal sprays. He is to continue montelukast  daily.   He is to continue Dupixent  for his sinus disease and asthma-COPD overlap syndrome.    He is to  continue Ohtuvarye nebs twice daily. Will look into his pharmacy issue.   He is to continue nasal rinses.  For OSA he is to continue NIV with iVAPsAutoEPAP 5-12cmH2O, PS 6-15cmH2O with target RR 15/min and target alveolar ventilation 5.2L/min  Follow up in 6 months.   Taylor Chill, MD [facility] Office: [phone]    Current Outpatient Medications:    albuterol  (ACCUNEB ) 0.63 MG/3ML nebulizer solution, USE 1 VIAL VIA NEBULIZER EVERY 6 HOURS AS NEEDED FOR WHEEZING OR SHORTNESS OF BREATH, Disp: 300 mL, Rfl: 2   albuterol  (VENTOLIN  HFA) 108 (90 Base) MCG/ACT inhaler, INHALE 2 PUFFS INTO THE LUNGS EVERY 4 HOURS AS NEEDED FOR WHEEZING OR SHORTNESS OF BREATH, Disp: 54 g, Rfl: 1   allopurinol (ZYLOPRIM) 100 MG tablet, Take 100 mg by mouth daily., Disp: , Rfl:    ALPRAZolam (XANAX) 0.25 MG tablet, Take 0.25 mg by mouth 3 (three) times daily as needed for anxiety., Disp: , Rfl:    aspirin  EC 81 MG tablet, Take 1 tablet (81 mg total) by mouth daily. Swallow whole., Disp: 90 tablet, Rfl: 3   atorvastatin  (LIPITOR) 40 MG tablet, Take 1 tablet (40 mg total) by mouth daily., Disp: 90 tablet, Rfl: 3   azelastine  (ASTELIN ) 0.1 % nasal spray, Place 1 spray into both nostrils 2 (two) times daily as needed for rhinitis., Disp: 3 mL, Rfl: 3   budesonide -formoterol  (SYMBICORT ) 160-4.5 MCG/ACT inhaler, Inhale 2 puffs into the lungs 2 (two) times daily., Disp: 3 each, Rfl: 3   cyanocobalamin (VITAMIN B12) 1000 MCG/ML injection, Inject 1,000 mcg into the muscle every 30 (thirty) days., Disp: , Rfl:    diclofenac Sodium (VOLTAREN) 1 % GEL, Apply 1 application topically as needed for pain., Disp: , Rfl:    Dupilumab  (DUPIXENT ) 300 MG/2ML SOAJ, Inject 300 mg into the skin every 14 (fourteen) days., Disp: 12 mL, Rfl: 0   Ensifentrine  (OHTUVAYRE ) 3 MG/2.5ML SUSP, Inhale 1 each into the lungs 2 (two) times daily., Disp: 150 mL, Rfl: 5   EPINEPHrine  (EPIPEN  2-PAK) 0.3 mg/0.3 mL IJ SOAJ  injection, Inject 0.3 mg into the muscle as needed for anaphylaxis., Disp: 2 each, Rfl: 5   famotidine (PEPCID) 40 MG tablet, Take 40 mg by mouth daily as needed for heartburn or indigestion., Disp: , Rfl:    fluticasone  (FLONASE ) 50 MCG/ACT nasal spray, Place 1 spray into both nostrils 2 (two) times daily as needed  for allergies or rhinitis., Disp: 3 g, Rfl: 3   HYDROcodone -acetaminophen  (NORCO/VICODIN) 5-325 MG tablet, Take 1 tablet by mouth as needed for pain., Disp: , Rfl:    ibuprofen (ADVIL) 200 MG tablet, Take 200 mg by mouth every 6 (six) hours as needed for pain., Disp: , Rfl:    ketorolac (TORADOL) 30 MG/ML injection, Inject 1 mL into the muscle 2 (two) times a week., Disp: , Rfl:    lidocaine  (LIDODERM ) 5 %, Place 1 patch onto the skin daily., Disp: , Rfl:    LORazepam (ATIVAN) 2 MG tablet, Take 1-2 mg by mouth every 8 (eight) hours as needed for anxiety., Disp: , Rfl:    metolazone  (ZAROXOLYN ) 2.5 MG tablet, Take 1 tablet (2.5 mg total) by mouth daily., Disp: 90 tablet, Rfl: 3   montelukast  (SINGULAIR ) 10 MG tablet, Take 1 tablet (10 mg total) by mouth at bedtime., Disp: 90 tablet, Rfl: 3   mupirocin ointment (BACTROBAN) 2 %, Apply 1 application topically 2 (two) times daily., Disp: , Rfl:    Na Sulfate-K Sulfate-Mg Sulf 17.5-3.13-1.6 GM/177ML SOLN, Use as directed; may use generic; goodrx card if insurance will not cover generic, Disp: 354 mL, Rfl: 0   naloxone (NARCAN) nasal spray 4 mg/0.1 mL, Place 1 spray into the nose as needed for opioid reversal., Disp: , Rfl:    nitroGLYCERIN  (NITROSTAT ) 0.4 MG SL tablet, Place 1 tablet (0.4 mg total) under the tongue every 5 (five) minutes as needed for chest pain., Disp: 25 tablet, Rfl: 11   NYSTATIN powder, Apply 1 application. topically 2 (two) times daily., Disp: , Rfl:    omeprazole (PRILOSEC) 20 MG capsule, Take 20 mg by mouth 2 (two) times daily., Disp: , Rfl:    pentoxifylline (TRENTAL) 400 MG CR tablet, Take 400 mg by mouth 2 (two) times  daily., Disp: , Rfl:    potassium chloride  SA (KLOR-CON  M) 20 MEQ tablet, Take 2 tablets (40 mEq total) by mouth daily., Disp: 180 tablet, Rfl: 3   Pseudoephedrine -Guaifenesin  [phone] MG TB12, Take 1 tablet by mouth in the morning and at bedtime., Disp: 30 tablet, Rfl: 1   Semaglutide (OZEMPIC, 0.25 OR 0.5 MG/DOSE, [place]), Inject 0.25 mg into the skin once a week., Disp: , Rfl:    SPIRIVA  HANDIHALER 18 MCG inhalation capsule, INHALE 1 CAPSULE BY MOUTH DAILY, Disp: 90 capsule, Rfl: 3   tamsulosin (FLOMAX) 0.4 MG CAPS capsule, Take 0.4 mg by mouth daily., Disp: , Rfl:    torsemide (DEMADEX) 10 MG tablet, Take 5-10 mg by mouth as needed for edema., Disp: , Rfl:

## 2023-10-26 NOTE — Patient Instructions (Addendum)
 We will reach out to our [facility] rep regarding the Ohtuvayre  nebulizer treatments  Continue Ohtuvayre  nebs twice daily  Continue on dupixent  injections as scheduled   Use symbicort  2 puffs twice daily - rinse mouth out after each use   Continue spiriva  inhaler daily   Use albuterol  inhaler (Ventolin  or Pro-Air) 1-2 puffs every 4-6 hours  Follow up in 6 months, call sooner if needed

## 2023-10-28 ENCOUNTER — Telehealth: Payer: Self-pay | Admitting: Pharmacist

## 2023-10-28 DIAGNOSIS — M1711 Unilateral primary osteoarthritis, right knee: Secondary | ICD-10-CM | POA: Diagnosis not present

## 2023-10-28 NOTE — Telephone Encounter (Signed)
 Patient received communication from [facility] stating patient's dispensing pharmacy [facility]) will no longer be able to dispense Ohtuvayre . Rx will be triaged back to [facility] who will send to another contracted pharmacy. I called patient and provided him with phone number for [facility] for him to f/u and find new dispensing pharmacy. He appreciated help. Nothing further needed  Phone#: [phone] Fax#: [phone]  Geraldene Kleine, PharmD, MPH, BCPS, CPP Clinical Pharmacist (Rheumatology and Pulmonology)

## 2023-10-31 ENCOUNTER — Encounter: Payer: Self-pay | Admitting: Pulmonary Disease

## 2023-11-04 ENCOUNTER — Other Ambulatory Visit: Payer: Self-pay

## 2023-11-09 DIAGNOSIS — M1711 Unilateral primary osteoarthritis, right knee: Secondary | ICD-10-CM | POA: Diagnosis not present

## 2023-11-09 DIAGNOSIS — T402X5A Adverse effect of other opioids, initial encounter: Secondary | ICD-10-CM | POA: Diagnosis not present

## 2023-11-09 DIAGNOSIS — M25561 Pain in right knee: Secondary | ICD-10-CM | POA: Diagnosis not present

## 2023-11-09 DIAGNOSIS — C679 Malignant neoplasm of bladder, unspecified: Secondary | ICD-10-CM | POA: Diagnosis not present

## 2023-11-09 DIAGNOSIS — K5903 Drug induced constipation: Secondary | ICD-10-CM | POA: Diagnosis not present

## 2023-11-09 DIAGNOSIS — M25562 Pain in left knee: Secondary | ICD-10-CM | POA: Diagnosis not present

## 2023-11-09 DIAGNOSIS — G8929 Other chronic pain: Secondary | ICD-10-CM | POA: Diagnosis not present

## 2023-11-09 DIAGNOSIS — G894 Chronic pain syndrome: Secondary | ICD-10-CM | POA: Diagnosis not present

## 2023-11-09 DIAGNOSIS — J449 Chronic obstructive pulmonary disease, unspecified: Secondary | ICD-10-CM | POA: Diagnosis not present

## 2023-11-09 DIAGNOSIS — R269 Unspecified abnormalities of gait and mobility: Secondary | ICD-10-CM | POA: Diagnosis not present

## 2023-11-09 DIAGNOSIS — M1712 Unilateral primary osteoarthritis, left knee: Secondary | ICD-10-CM | POA: Diagnosis not present

## 2023-11-12 DIAGNOSIS — M1712 Unilateral primary osteoarthritis, left knee: Secondary | ICD-10-CM | POA: Diagnosis not present

## 2023-11-14 DIAGNOSIS — J9611 Chronic respiratory failure with hypoxia: Secondary | ICD-10-CM | POA: Diagnosis not present

## 2023-11-14 DIAGNOSIS — J449 Chronic obstructive pulmonary disease, unspecified: Secondary | ICD-10-CM | POA: Diagnosis not present

## 2023-11-22 DIAGNOSIS — M25561 Pain in right knee: Secondary | ICD-10-CM | POA: Diagnosis not present

## 2023-11-22 DIAGNOSIS — M170 Bilateral primary osteoarthritis of knee: Secondary | ICD-10-CM | POA: Diagnosis not present

## 2023-11-22 DIAGNOSIS — M25562 Pain in left knee: Secondary | ICD-10-CM | POA: Diagnosis not present

## 2023-11-29 ENCOUNTER — Other Ambulatory Visit: Payer: Self-pay

## 2023-12-01 ENCOUNTER — Other Ambulatory Visit: Payer: Self-pay | Admitting: Pulmonary Disease

## 2023-12-01 ENCOUNTER — Other Ambulatory Visit: Payer: Self-pay | Admitting: Pharmacy Technician

## 2023-12-01 ENCOUNTER — Other Ambulatory Visit: Payer: Self-pay

## 2023-12-01 DIAGNOSIS — J4489 Other specified chronic obstructive pulmonary disease: Secondary | ICD-10-CM

## 2023-12-01 MED ORDER — DUPIXENT 300 MG/2ML ~~LOC~~ SOAJ
300.0000 mg | SUBCUTANEOUS | 1 refills | Status: DC
Start: 2023-12-01 — End: 2024-05-08
  Filled 2023-12-01: qty 4, 28d supply, fill #0
  Filled 2023-12-01: qty 12, 84d supply, fill #0
  Filled 2023-12-21: qty 4, 28d supply, fill #1
  Filled 2024-01-19: qty 4, 28d supply, fill #2
  Filled 2024-02-16: qty 4, 28d supply, fill #3
  Filled 2024-03-16 – 2024-03-20 (×2): qty 4, 28d supply, fill #4
  Filled 2024-04-13: qty 4, 28d supply, fill #5

## 2023-12-01 NOTE — Telephone Encounter (Signed)
 Refill sent for DUPIXENT  to [facility]: [phone]   Dose: 300mg  subcut every 14 days  Last OV: 10/26/23 Provider: Dr. Diania Fortes  Next OV: due in 6 months (not yet scheduled)  Geraldene Kleine, PharmD, MPH, BCPS Clinical Pharmacist (Rheumatology and Pulmonology)

## 2023-12-01 NOTE — Progress Notes (Signed)
 Specialty Pharmacy Refill Coordination Note  Taylor Vargas is a 63 y.o. male contacted today regarding refills of specialty medication(s) Dupilumab  (Dupixent )   Patient requested Delivery   Delivery date: 12/03/23   Verified address: [address], [place], [place] [address]   Medication will be filled on 12/02/23.

## 2023-12-09 DIAGNOSIS — M1711 Unilateral primary osteoarthritis, right knee: Secondary | ICD-10-CM | POA: Diagnosis not present

## 2023-12-09 DIAGNOSIS — M6281 Muscle weakness (generalized): Secondary | ICD-10-CM | POA: Diagnosis not present

## 2023-12-09 DIAGNOSIS — G8929 Other chronic pain: Secondary | ICD-10-CM | POA: Diagnosis not present

## 2023-12-09 DIAGNOSIS — M25561 Pain in right knee: Secondary | ICD-10-CM | POA: Diagnosis not present

## 2023-12-09 DIAGNOSIS — Z9981 Dependence on supplemental oxygen: Secondary | ICD-10-CM | POA: Diagnosis not present

## 2023-12-09 DIAGNOSIS — G894 Chronic pain syndrome: Secondary | ICD-10-CM | POA: Diagnosis not present

## 2023-12-09 DIAGNOSIS — J9611 Chronic respiratory failure with hypoxia: Secondary | ICD-10-CM | POA: Diagnosis not present

## 2023-12-09 DIAGNOSIS — M25562 Pain in left knee: Secondary | ICD-10-CM | POA: Diagnosis not present

## 2023-12-09 DIAGNOSIS — J449 Chronic obstructive pulmonary disease, unspecified: Secondary | ICD-10-CM | POA: Diagnosis not present

## 2023-12-09 DIAGNOSIS — M1712 Unilateral primary osteoarthritis, left knee: Secondary | ICD-10-CM | POA: Diagnosis not present

## 2023-12-09 DIAGNOSIS — T402X5A Adverse effect of other opioids, initial encounter: Secondary | ICD-10-CM | POA: Diagnosis not present

## 2023-12-09 DIAGNOSIS — K5903 Drug induced constipation: Secondary | ICD-10-CM | POA: Diagnosis not present

## 2023-12-09 DIAGNOSIS — C679 Malignant neoplasm of bladder, unspecified: Secondary | ICD-10-CM | POA: Diagnosis not present

## 2023-12-09 DIAGNOSIS — R269 Unspecified abnormalities of gait and mobility: Secondary | ICD-10-CM | POA: Diagnosis not present

## 2023-12-11 ENCOUNTER — Other Ambulatory Visit: Payer: Self-pay | Admitting: Cardiology

## 2023-12-13 NOTE — Telephone Encounter (Signed)
 Refill not appropriate, dose change

## 2023-12-14 DIAGNOSIS — J449 Chronic obstructive pulmonary disease, unspecified: Secondary | ICD-10-CM | POA: Diagnosis not present

## 2023-12-14 DIAGNOSIS — J9611 Chronic respiratory failure with hypoxia: Secondary | ICD-10-CM | POA: Diagnosis not present

## 2023-12-21 ENCOUNTER — Other Ambulatory Visit: Payer: Self-pay

## 2023-12-21 NOTE — Progress Notes (Signed)
 Specialty Pharmacy Refill Coordination Note  Taylor Vargas is a 63 y.o. male contacted today regarding refills of specialty medication(s) Dupilumab  (Dupixent )   Patient requested Delivery   Delivery date: 12/28/23   Verified address: [address], [place], [place] [address]   Medication will be filled on 12/27/23.

## 2024-01-10 DIAGNOSIS — K5903 Drug induced constipation: Secondary | ICD-10-CM | POA: Diagnosis not present

## 2024-01-10 DIAGNOSIS — C679 Malignant neoplasm of bladder, unspecified: Secondary | ICD-10-CM | POA: Diagnosis not present

## 2024-01-10 DIAGNOSIS — J449 Chronic obstructive pulmonary disease, unspecified: Secondary | ICD-10-CM | POA: Diagnosis not present

## 2024-01-10 DIAGNOSIS — M25562 Pain in left knee: Secondary | ICD-10-CM | POA: Diagnosis not present

## 2024-01-10 DIAGNOSIS — M25561 Pain in right knee: Secondary | ICD-10-CM | POA: Diagnosis not present

## 2024-01-10 DIAGNOSIS — R269 Unspecified abnormalities of gait and mobility: Secondary | ICD-10-CM | POA: Diagnosis not present

## 2024-01-10 DIAGNOSIS — T402X5A Adverse effect of other opioids, initial encounter: Secondary | ICD-10-CM | POA: Diagnosis not present

## 2024-01-10 DIAGNOSIS — G894 Chronic pain syndrome: Secondary | ICD-10-CM | POA: Diagnosis not present

## 2024-01-10 DIAGNOSIS — M1711 Unilateral primary osteoarthritis, right knee: Secondary | ICD-10-CM | POA: Diagnosis not present

## 2024-01-10 DIAGNOSIS — G8929 Other chronic pain: Secondary | ICD-10-CM | POA: Diagnosis not present

## 2024-01-10 DIAGNOSIS — M1712 Unilateral primary osteoarthritis, left knee: Secondary | ICD-10-CM | POA: Diagnosis not present

## 2024-01-14 DIAGNOSIS — M25562 Pain in left knee: Secondary | ICD-10-CM | POA: Diagnosis not present

## 2024-01-14 DIAGNOSIS — J449 Chronic obstructive pulmonary disease, unspecified: Secondary | ICD-10-CM | POA: Diagnosis not present

## 2024-01-14 DIAGNOSIS — M25561 Pain in right knee: Secondary | ICD-10-CM | POA: Diagnosis not present

## 2024-01-14 DIAGNOSIS — G8929 Other chronic pain: Secondary | ICD-10-CM | POA: Diagnosis not present

## 2024-01-19 ENCOUNTER — Other Ambulatory Visit: Payer: Self-pay

## 2024-01-19 NOTE — Progress Notes (Signed)
 Specialty Pharmacy Refill Coordination Note  Taylor Vargas is a 63 y.o. male contacted today regarding refills of specialty medication(s) Dupilumab  (Dupixent )   Patient requested Delivery   Delivery date: 01/25/24   Verified address: [address] [place], [place] [address]   Medication will be filled on 08.11.25.

## 2024-01-24 ENCOUNTER — Other Ambulatory Visit: Payer: Self-pay

## 2024-02-01 DIAGNOSIS — G4733 Obstructive sleep apnea (adult) (pediatric): Secondary | ICD-10-CM | POA: Diagnosis not present

## 2024-02-08 DIAGNOSIS — C679 Malignant neoplasm of bladder, unspecified: Secondary | ICD-10-CM | POA: Diagnosis not present

## 2024-02-08 DIAGNOSIS — K5903 Drug induced constipation: Secondary | ICD-10-CM | POA: Diagnosis not present

## 2024-02-08 DIAGNOSIS — M25561 Pain in right knee: Secondary | ICD-10-CM | POA: Diagnosis not present

## 2024-02-08 DIAGNOSIS — G8929 Other chronic pain: Secondary | ICD-10-CM | POA: Diagnosis not present

## 2024-02-08 DIAGNOSIS — M1711 Unilateral primary osteoarthritis, right knee: Secondary | ICD-10-CM | POA: Diagnosis not present

## 2024-02-08 DIAGNOSIS — T402X5A Adverse effect of other opioids, initial encounter: Secondary | ICD-10-CM | POA: Diagnosis not present

## 2024-02-08 DIAGNOSIS — R269 Unspecified abnormalities of gait and mobility: Secondary | ICD-10-CM | POA: Diagnosis not present

## 2024-02-08 DIAGNOSIS — J449 Chronic obstructive pulmonary disease, unspecified: Secondary | ICD-10-CM | POA: Diagnosis not present

## 2024-02-08 DIAGNOSIS — M25562 Pain in left knee: Secondary | ICD-10-CM | POA: Diagnosis not present

## 2024-02-08 DIAGNOSIS — G894 Chronic pain syndrome: Secondary | ICD-10-CM | POA: Diagnosis not present

## 2024-02-08 DIAGNOSIS — M1712 Unilateral primary osteoarthritis, left knee: Secondary | ICD-10-CM | POA: Diagnosis not present

## 2024-02-11 ENCOUNTER — Other Ambulatory Visit: Payer: Self-pay

## 2024-02-14 DIAGNOSIS — J449 Chronic obstructive pulmonary disease, unspecified: Secondary | ICD-10-CM | POA: Diagnosis not present

## 2024-02-16 ENCOUNTER — Other Ambulatory Visit: Payer: Self-pay

## 2024-02-16 NOTE — Progress Notes (Signed)
 Specialty Pharmacy Refill Coordination Note  Taylor Vargas is a 63 y.o. male contacted today regarding refills of specialty medication(s) Dupilumab  (Dupixent )   Patient requested Delivery   Delivery date: 02/23/24   Verified address: [address], [place], [place] [address]   Medication will be filled on 09.09.25.

## 2024-03-09 ENCOUNTER — Telehealth: Payer: Self-pay

## 2024-03-09 DIAGNOSIS — C679 Malignant neoplasm of bladder, unspecified: Secondary | ICD-10-CM | POA: Diagnosis not present

## 2024-03-09 DIAGNOSIS — J449 Chronic obstructive pulmonary disease, unspecified: Secondary | ICD-10-CM | POA: Diagnosis not present

## 2024-03-09 DIAGNOSIS — R269 Unspecified abnormalities of gait and mobility: Secondary | ICD-10-CM | POA: Diagnosis not present

## 2024-03-09 DIAGNOSIS — M25561 Pain in right knee: Secondary | ICD-10-CM | POA: Diagnosis not present

## 2024-03-09 DIAGNOSIS — G8929 Other chronic pain: Secondary | ICD-10-CM | POA: Diagnosis not present

## 2024-03-09 DIAGNOSIS — M1711 Unilateral primary osteoarthritis, right knee: Secondary | ICD-10-CM | POA: Diagnosis not present

## 2024-03-09 DIAGNOSIS — M25562 Pain in left knee: Secondary | ICD-10-CM | POA: Diagnosis not present

## 2024-03-09 DIAGNOSIS — M1712 Unilateral primary osteoarthritis, left knee: Secondary | ICD-10-CM | POA: Diagnosis not present

## 2024-03-09 DIAGNOSIS — G894 Chronic pain syndrome: Secondary | ICD-10-CM | POA: Diagnosis not present

## 2024-03-09 NOTE — Telephone Encounter (Signed)
 This encounter was created in error - please disregard.

## 2024-03-09 NOTE — Telephone Encounter (Signed)
 Spoke with patient scheduled 6 month  f/u

## 2024-03-11 ENCOUNTER — Other Ambulatory Visit: Payer: Self-pay | Admitting: Adult Health

## 2024-03-11 DIAGNOSIS — J4489 Other specified chronic obstructive pulmonary disease: Secondary | ICD-10-CM

## 2024-03-14 ENCOUNTER — Other Ambulatory Visit: Payer: Self-pay

## 2024-03-15 DIAGNOSIS — J449 Chronic obstructive pulmonary disease, unspecified: Secondary | ICD-10-CM | POA: Diagnosis not present

## 2024-03-16 ENCOUNTER — Telehealth: Payer: Self-pay

## 2024-03-16 ENCOUNTER — Other Ambulatory Visit: Payer: Self-pay

## 2024-03-16 ENCOUNTER — Other Ambulatory Visit (HOSPITAL_COMMUNITY): Payer: Self-pay

## 2024-03-16 NOTE — Progress Notes (Signed)
PA submitted, will update once we receive a response.

## 2024-03-16 NOTE — Telephone Encounter (Signed)
 Received notification from [employer] regarding a prior authorization for DUPIXENT . Authorization has been APPROVED from 03/15/24 to 06/14/25. Approval letter sent to scan center.  Per test claim, copay for 28 days supply is $0  Patient can continue to fill through [facility]: [phone]   Authorization # [id number] Phone # [phone]

## 2024-03-16 NOTE — Telephone Encounter (Signed)
 Received notification via telephone call from specialty pharmacy that Dupixent  needs a pa. Submitted a Prior Authorization request to [employer] for DUPIXENT  via CoverMyMeds. Will update once we receive a response.  Key: [id number]

## 2024-03-17 ENCOUNTER — Other Ambulatory Visit: Payer: Self-pay | Admitting: Pulmonary Disease

## 2024-03-17 DIAGNOSIS — J4489 Other specified chronic obstructive pulmonary disease: Secondary | ICD-10-CM

## 2024-03-20 ENCOUNTER — Other Ambulatory Visit: Payer: Self-pay

## 2024-03-20 NOTE — Progress Notes (Signed)
 Specialty Pharmacy Refill Coordination Note  Taylor Vargas is a 63 y.o. male contacted today regarding refills of specialty medication(s) Dupilumab  (Dupixent )   Patient requested Delivery   Delivery date: 03/22/24   Verified address: [address] [place], [place] [address]   Medication will be filled on 03/21/24.

## 2024-03-22 ENCOUNTER — Telehealth: Payer: Self-pay | Admitting: Pulmonary Disease

## 2024-03-22 NOTE — Telephone Encounter (Signed)
 Copied from CRM [id number]. Topic: Medical Record Request - Provider/Facility Request >> Mar 22, 2024  2:30 PM Chantha C wrote: Reason for CRM: Elijah from [facility] [phone] needs patient most recent office visit to fax#[phone]. Informed Elijah, patient  was last seen 10/26/23 with Dr. Dorn Chill and upcoming appointment 04/24/24. Elijah needs the 10/26/23 chart note fax please.

## 2024-03-22 NOTE — Telephone Encounter (Signed)
 Has been completed

## 2024-03-23 ENCOUNTER — Encounter: Payer: Self-pay | Admitting: Cardiology

## 2024-03-23 ENCOUNTER — Ambulatory Visit: Attending: Cardiology | Admitting: Cardiology

## 2024-03-23 ENCOUNTER — Telehealth: Payer: Self-pay | Admitting: Pulmonary Disease

## 2024-03-23 VITALS — BP 96/60 | HR 104 | Ht 66.0 in | Wt 320.4 lb

## 2024-03-23 DIAGNOSIS — Z9981 Dependence on supplemental oxygen: Secondary | ICD-10-CM | POA: Diagnosis not present

## 2024-03-23 DIAGNOSIS — E7800 Pure hypercholesterolemia, unspecified: Secondary | ICD-10-CM

## 2024-03-23 DIAGNOSIS — J431 Panlobular emphysema: Secondary | ICD-10-CM

## 2024-03-23 DIAGNOSIS — I2510 Atherosclerotic heart disease of native coronary artery without angina pectoris: Secondary | ICD-10-CM | POA: Diagnosis not present

## 2024-03-23 MED ORDER — ASPIRIN 81 MG PO TBEC: 81.0000 mg | DELAYED_RELEASE_TABLET | Freq: Every day | ORAL | 3 refills | Status: AC

## 2024-03-23 MED ORDER — NITROGLYCERIN 0.4 MG SL SUBL: 0.4000 mg | SUBLINGUAL_TABLET | SUBLINGUAL | 11 refills | Status: AC | PRN

## 2024-03-23 MED ORDER — POTASSIUM CHLORIDE CRYS ER 20 MEQ PO TBCR: 40.0000 meq | EXTENDED_RELEASE_TABLET | Freq: Every day | ORAL | 3 refills | Status: AC

## 2024-03-23 MED ORDER — ATORVASTATIN CALCIUM 40 MG PO TABS: 40.0000 mg | ORAL_TABLET | Freq: Every day | ORAL | 3 refills | Status: AC

## 2024-03-23 MED ORDER — METOLAZONE 2.5 MG PO TABS: 2.5000 mg | ORAL_TABLET | Freq: Every day | ORAL | 3 refills | Status: AC

## 2024-03-23 NOTE — Patient Instructions (Signed)

## 2024-03-23 NOTE — Telephone Encounter (Signed)
 Copied from CRM [id number]. Topic: General - Other >> Mar 23, 2024  1:32 PM Whitney O wrote: Reason for CRM: Tiffany from [facility] states they got some chart notes from 10/26/2023 and was wondering if Dr. Kara can do a addendum stating the patient is on a vent and not a cpap  Fax number [phone] Telephone number [phone] ext [phone]

## 2024-03-23 NOTE — Progress Notes (Signed)
 Cardiology Office Note:    Date:  03/23/2024   ID:  Taylor Vargas, DOB 1960/11/06, MRN [id number]  PCP:  Street, Lonni HERO, MD  Cardiologist:  Jennifer JONELLE Crape, MD   Referring MD: [id number] Lonni HERO, *    ASSESSMENT:    1. Hypercholesterolemia   2. Coronary artery calcification seen on CAT scan   3. Panlobular emphysema (HCC)   4. Morbid obesity (HCC)   5. On supplemental oxygen  therapy    PLAN:    In order of problems listed above:  Coronary artery calcification: Secondary prevention stressed with the patient.  Importance of compliance with diet medication stressed and patient verbalized standing. He cannot do much with ambulation because of orthopedic issues, morbid obesity and COPD on supplemental oxygen . Essential hypertension: Blood pressure stable and diet was emphasized.  Lifestyle modification urged. Mixed dyslipidemia: On lipid-lowering medications followed by primary care. Morbid obesity: Weight reduction stressed diet emphasized and he promises to do better. Hypokalemia: Will do a Chem-7 today let him know the results. Patient will be seen in follow-up appointment in 6 months or earlier if the patient has any concerns.    Medication Adjustments/Labs and Tests Ordered: Current medicines are reviewed at length with the patient today.  Concerns regarding medicines are outlined above.  Orders Placed This Encounter  Procedures   EKG 12-Lead   No orders of the defined types were placed in this encounter.    No chief complaint on file.    History of Present Illness:    Taylor Vargas is a 63 y.o. male.  Patient has past medical history of coronary artery calcification, essential hypertension, mixed dyslipidemia, COPD on supplemental oxygen  and morbid obesity.  He leads a sedentary lifestyle.  He mentions to me that he has orthopedic issues.  At the time of my evaluation, the patient is alert awake oriented and in no distress.  Past Medical History:  Diagnosis Date    Abnormal glucose 08/27/2015   Last Assessment & Plan:  Relevant Hx: Course: Daily Update: Today's Plan:will get an a1c especially given his increased urinary frequency  Electronically signed by: Taylor Dariel Sayres, FNP 08/27/15 1513   Arthritis    Benign prostatic hyperplasia with lower urinary tract symptoms 08/27/2015   Last Assessment & Plan:  Relevant Hx: Course: Daily Update: Today's Plan:taking flomax for this, but his was presumed, no workup for this diagnosis  Electronically signed by: Taylor Dariel Sayres, FNP 08/27/15 1513   Chest tightness 11/08/2018   Chronic diastolic heart failure (HCC) 04/21/2019   Chronic obstructive pulmonary disease (HCC) 08/27/2015   Last Assessment & Plan:  Relevant Hx: Course: Daily Update: Today's Plan:this is likely in acute exacerbation. I have discussed with him that I would like to et cxr to rule out acute pathology. He finally agrees to this. His sat came up to 89% on RA He understands s/e's of the prednisone  and if he does have an element of diabetes or prediabtes this could make that worse for him  Electronically sig   Chronic pain of both knees 01/04/2020   Formatting of this note might be different from the original. Added automatically from request for surgery [id number]   Congenital absence of one kidney    01-04-2018 per pt unsure which one   COPD (chronic obstructive pulmonary disease) (HCC)    pulmologist-  dr mardee jennye)   Coronary artery calcification seen on CAT scan 11/30/2019   GERD (gastroesophageal reflux disease)    History of colon polyps 04/26/2023  Hypercholesterolemia 09/10/2015   Hyperglycemia 09/10/2015   Malaise and fatigue 08/27/2015   Last Assessment & Plan:  Relevant Hx: Course: Daily Update: Today's Plan:will get labs to rule out acute pathology   Electronically signed by: Taylor Dariel Sayres, FNP 08/27/15 1513   Morbid obesity (HCC) 11/02/2018   On supplemental oxygen  therapy    01-04-2018  per pt  prescribed as needed ,  states checks oxygen  level daily,  and average between 88-90% on room air,  per pt has not needed O2 past 4 months   OSA on CPAP    Osteoarthritis of right knee 01/11/2020   Pain in left knee 12/28/2017   Pancreatic cancer (HCC)    01-04-2018  per pt dx 10/ 2009 found by CT imaging, no surgery,  treatment chemotherapy  @ [facility],  in remission since 2017   Solitary thyroid nodule 09/10/2015    Past Surgical History:  Procedure Laterality Date   BIOPSY  04/26/2023   Procedure: BIOPSY;  Surgeon: Taylor Rosario BROCKS, MD;  Location: [facility];  Service: Gastroenterology;;   COLONOSCOPY  last one 2019   COLONOSCOPY WITH PROPOFOL  N/A 04/26/2023   Procedure: COLONOSCOPY WITH PROPOFOL ;  Surgeon: Taylor Rosario BROCKS, MD;  Location: [facility];  Service: Gastroenterology;  Laterality: N/A;   ESOPHAGOGASTRODUODENOSCOPY     Said last one done around 2019-2020   ESOPHAGOGASTRODUODENOSCOPY (EGD) WITH PROPOFOL  N/A 04/26/2023   Procedure: ESOPHAGOGASTRODUODENOSCOPY (EGD) WITH PROPOFOL ;  Surgeon: Taylor Rosario BROCKS, MD;  Location: [facility];  Service: Gastroenterology;  Laterality: N/A;   KNEE ARTHROSCOPY Right 2002   KNEE ARTHROSCOPY WITH MEDIAL MENISECTOMY Left 01/06/2018   Procedure: LEFT KNEE ARTHROSCOPY WITH MEDIAL MENISECTOMY;  Surgeon: Taylor Rogue, MD;  Location: [facility];  Service: Orthopedics;  Laterality: Left;   LEFT HEART CATH AND CORONARY ANGIOGRAPHY N/A 11/21/2018   Procedure: LEFT HEART CATH AND CORONARY ANGIOGRAPHY;  Surgeon: Taylor Bruckner, MD;  Location: [facility];  Service: Cardiovascular;  Laterality: N/A;   POLYPECTOMY  04/26/2023   Procedure: POLYPECTOMY;  Surgeon: Taylor Rosario BROCKS, MD;  Location: [facility];  Service: Gastroenterology;;    Current Medications: Current Meds  Medication Sig   albuterol  (ACCUNEB ) 0.63 MG/3ML nebulizer solution USE 1 VIAL VIA NEBULIZER EVERY 6 HOURS AS NEEDED FOR WHEEZING OR SHORTNESS OF BREATH    allopurinol (ZYLOPRIM) 100 MG tablet Take 100 mg by mouth daily.   ALPRAZolam (XANAX) 0.25 MG tablet Take 0.25 mg by mouth 3 (three) times daily as needed for anxiety.   aspirin  EC 81 MG tablet Take 1 tablet (81 mg total) by mouth daily. Swallow whole.   atorvastatin  (LIPITOR) 40 MG tablet Take 1 tablet (40 mg total) by mouth daily.   azelastine  (ASTELIN ) 0.1 % nasal spray Place 1 spray into both nostrils 2 (two) times daily as needed for rhinitis.   budesonide -formoterol  (SYMBICORT ) 160-4.5 MCG/ACT inhaler INHALE 2 PUFFS INTO THE LUNGS TWICE DAILY   cyanocobalamin (VITAMIN B12) 1000 MCG/ML injection Inject 1,000 mcg into the muscle every 30 (thirty) days.   diclofenac Sodium (VOLTAREN) 1 % GEL Apply 1 application topically as needed for pain.   Dupilumab  (DUPIXENT ) 300 MG/2ML SOAJ Inject 300 mg into the skin every 14 (fourteen) days.   Ensifentrine  (OHTUVAYRE ) 3 MG/2.5ML SUSP Inhale 1 each into the lungs 2 (two) times daily.   EPINEPHrine  (EPIPEN  2-PAK) 0.3 mg/0.3 mL IJ SOAJ injection Inject 0.3 mg into the muscle as needed for anaphylaxis.   famotidine (PEPCID) 40 MG tablet Take 40 mg by  mouth daily as needed for heartburn or indigestion.   fluticasone  (FLONASE ) 50 MCG/ACT nasal spray Place 1 spray into both nostrils 2 (two) times daily as needed for allergies or rhinitis.   HYDROcodone -acetaminophen  (NORCO/VICODIN) 5-325 MG tablet Take 1 tablet by mouth as needed for pain.   ibuprofen (ADVIL) 200 MG tablet Take 200 mg by mouth every 6 (six) hours as needed for pain.   ketorolac (TORADOL) 30 MG/ML injection Inject 1 mL into the muscle 2 (two) times a week.   lidocaine  (LIDODERM ) 5 % Place 1 patch onto the skin daily.   LORazepam (ATIVAN) 2 MG tablet Take 1-2 mg by mouth every 8 (eight) hours as needed for anxiety.   metolazone  (ZAROXOLYN ) 2.5 MG tablet Take 1 tablet (2.5 mg total) by mouth daily.   montelukast  (SINGULAIR ) 10 MG tablet Take 1 tablet (10 mg total) by mouth at bedtime.   mupirocin  ointment (BACTROBAN) 2 % Apply 1 application topically 2 (two) times daily.   Na Sulfate-K Sulfate-Mg Sulf 17.5-3.13-1.6 GM/177ML SOLN Use as directed; may use generic; goodrx card if insurance will not cover generic   naloxone (NARCAN) nasal spray 4 mg/0.1 mL Place 1 spray into the nose as needed for opioid reversal.   nitroGLYCERIN  (NITROSTAT ) 0.4 MG SL tablet Place 1 tablet (0.4 mg total) under the tongue every 5 (five) minutes as needed for chest pain.   NYSTATIN powder Apply 1 application. topically 2 (two) times daily.   omeprazole (PRILOSEC) 20 MG capsule Take 20 mg by mouth 2 (two) times daily.   pentoxifylline (TRENTAL) 400 MG CR tablet Take 400 mg by mouth 2 (two) times daily.   potassium chloride  SA (KLOR-CON  M) 20 MEQ tablet Take 2 tablets (40 mEq total) by mouth daily.   Pseudoephedrine -Guaifenesin  [phone] MG TB12 Take 1 tablet by mouth in the morning and at bedtime.   Semaglutide (OZEMPIC, 0.25 OR 0.5 MG/DOSE, [place]) Inject 0.25 mg into the skin once a week.   SPIRIVA  HANDIHALER 18 MCG inhalation capsule INHALE 1 CAPSULE BY MOUTH DAILY   tamsulosin (FLOMAX) 0.4 MG CAPS capsule Take 0.4 mg by mouth daily.   torsemide (DEMADEX) 10 MG tablet Take 5-10 mg by mouth as needed for edema.   VENTOLIN  HFA 108 (90 Base) MCG/ACT inhaler INHALE 2 PUFFS INTO THE LUNGS EVERY 4 HOURS AS NEEDED FOR WHEEZING OR SHORTNESS OF BREATH     Allergies:   Patient has no known allergies.   Social History   Socioeconomic History   Marital status: Single    Spouse name: Not on file   Number of children: Not on file   Years of education: Not on file   Highest education level: Not on file  Occupational History   Not on file  Tobacco Use   Smoking status: Former    Current packs/day: 0.00    Average packs/day: 2.0 packs/day for 31.0 years (62.0 ttl pk-yrs)    Types: Cigarettes    Start date: 68    Quit date: 2018    Years since quitting: 7.7   Smokeless tobacco: Never  Vaping Use   Vaping status:  Never Used  Substance and Sexual Activity   Alcohol use: Not Currently   Drug use: Never   Sexual activity: Not on file  Other Topics Concern   Not on file  Social History Narrative   Not on file   Social Drivers of Health   Financial Resource Strain: Not on file  Food Insecurity: Not on file  Transportation  Needs: Not on file  Physical Activity: Not on file  Stress: Not on file  Social Connections: Not on file     Family History: The patient's family history includes Breast cancer in his mother; Diabetes in his mother; Emphysema in his maternal grandfather. There is no history of Colon cancer or Esophageal cancer.  ROS:   Please see the history of present illness.    All other systems reviewed and are negative.  EKGs/Labs/Other Studies Reviewed:    The following studies were reviewed today: .SABRAEKG Interpretation Date/Time:  Thursday March 23 2024 11:06:36 EDT Ventricular Rate:  104 PR Interval:  172 QRS Duration:  90 QT Interval:  332 QTC Calculation: 436 R Axis:   110  Text Interpretation: Sinus tachycardia Low voltage QRS Left posterior fascicular block Nonspecific T wave abnormality Abnormal ECG No previous ECGs available Confirmed by Edwyna Backers [phone]) on 03/23/2024 11:10:05 AM     Recent Labs: 06/23/2023: Hemoglobin 15.6; Platelets 311; TSH 1.860 09/17/2023: ALT 22; BUN 12; Creatinine, Ser 1.08; Potassium 3.8; Sodium 137  Recent Lipid Panel    Component Value Date/Time   CHOL 156 09/17/2023 1120   TRIG 94 09/17/2023 1120   HDL 46 09/17/2023 1120   CHOLHDL 3.4 09/17/2023 1120   LDLCALC 92 09/17/2023 1120    Physical Exam:    VS:  BP 96/60   Pulse (!) 104   Ht 5' 6 (1.676 m)   Wt (!) 320 lb 6.4 oz (145.3 kg)   SpO2 90%   BMI 51.71 kg/m     Wt Readings from Last 3 Encounters:  03/23/24 (!) 320 lb 6.4 oz (145.3 kg)  10/26/23 (!) 309 lb (140.2 kg)  06/23/23 (!) 311 lb 3.2 oz (141.2 kg)     GEN: Patient is in no acute distress HEENT:  Normal NECK: No JVD; No carotid bruits LYMPHATICS: No lymphadenopathy CARDIAC: Hear sounds regular, 2/6 systolic murmur at the apex. RESPIRATORY:  Clear to auscultation without rales, wheezing or rhonchi  ABDOMEN: Soft, non-tender, non-distended MUSCULOSKELETAL:  No edema; No deformity  SKIN: Warm and dry NEUROLOGIC:  Alert and oriented x 3 PSYCHIATRIC:  Normal affect   Signed, Backers JONELLE Edwyna, MD  03/23/2024 11:20 AM    [facility]

## 2024-03-24 ENCOUNTER — Ambulatory Visit: Payer: Self-pay | Admitting: Cardiology

## 2024-03-24 LAB — BASIC METABOLIC PANEL WITH GFR
BUN/Creatinine Ratio: 11 (ref 10–24)
BUN: 13 mg/dL (ref 8–27)
CO2: 25 mmol/L (ref 20–29)
Calcium: 9.3 mg/dL (ref 8.6–10.2)
Chloride: 97 mmol/L (ref 96–106)
Creatinine, Ser: 1.16 mg/dL (ref 0.76–1.27)
Glucose: 123 mg/dL — ABNORMAL HIGH (ref 70–99)
Potassium: 3.7 mmol/L (ref 3.5–5.2)
Sodium: 138 mmol/L (ref 134–144)
eGFR: 70 mL/min/1.73 (ref >59)

## 2024-03-27 DIAGNOSIS — J9611 Chronic respiratory failure with hypoxia: Secondary | ICD-10-CM | POA: Diagnosis not present

## 2024-03-27 DIAGNOSIS — J449 Chronic obstructive pulmonary disease, unspecified: Secondary | ICD-10-CM | POA: Diagnosis not present

## 2024-03-30 NOTE — Telephone Encounter (Signed)
 Please have [employer] send us  the exact machine and settings he is on so I can update my note accordingly.  Thanks, JD

## 2024-04-04 DIAGNOSIS — C672 Malignant neoplasm of lateral wall of bladder: Secondary | ICD-10-CM | POA: Diagnosis not present

## 2024-04-10 NOTE — Telephone Encounter (Signed)
 Last OV noted updated.  JD

## 2024-04-11 DIAGNOSIS — H6092 Unspecified otitis externa, left ear: Secondary | ICD-10-CM | POA: Diagnosis not present

## 2024-04-11 DIAGNOSIS — J9611 Chronic respiratory failure with hypoxia: Secondary | ICD-10-CM | POA: Diagnosis not present

## 2024-04-11 DIAGNOSIS — R220 Localized swelling, mass and lump, head: Secondary | ICD-10-CM | POA: Diagnosis not present

## 2024-04-11 DIAGNOSIS — J329 Chronic sinusitis, unspecified: Secondary | ICD-10-CM | POA: Diagnosis not present

## 2024-04-12 ENCOUNTER — Other Ambulatory Visit: Payer: Self-pay

## 2024-04-13 ENCOUNTER — Other Ambulatory Visit: Payer: Self-pay

## 2024-04-14 ENCOUNTER — Other Ambulatory Visit: Payer: Self-pay

## 2024-04-14 ENCOUNTER — Other Ambulatory Visit: Payer: Self-pay | Admitting: Pharmacy Technician

## 2024-04-14 NOTE — Progress Notes (Signed)
 Specialty Pharmacy Refill Coordination Note  Taylor Vargas is a 63 y.o. male contacted today regarding refills of specialty medication(s) Dupilumab  (Dupixent )   Patient requested Delivery   Delivery date: 04/18/24   Verified address: [address]  [place]   Medication will be filled on: 04/17/24

## 2024-04-15 DIAGNOSIS — J449 Chronic obstructive pulmonary disease, unspecified: Secondary | ICD-10-CM | POA: Diagnosis not present

## 2024-04-17 ENCOUNTER — Other Ambulatory Visit: Payer: Self-pay

## 2024-04-21 ENCOUNTER — Other Ambulatory Visit: Payer: Self-pay | Admitting: Adult Health

## 2024-04-24 ENCOUNTER — Ambulatory Visit: Admitting: Pulmonary Disease

## 2024-04-24 ENCOUNTER — Encounter: Payer: Self-pay | Admitting: Pulmonary Disease

## 2024-04-24 VITALS — BP 95/69 | HR 108 | Ht 72.0 in | Wt 306.0 lb

## 2024-04-24 DIAGNOSIS — Z23 Encounter for immunization: Secondary | ICD-10-CM | POA: Diagnosis not present

## 2024-04-24 DIAGNOSIS — G4733 Obstructive sleep apnea (adult) (pediatric): Secondary | ICD-10-CM

## 2024-04-24 DIAGNOSIS — J4489 Other specified chronic obstructive pulmonary disease: Secondary | ICD-10-CM

## 2024-04-24 DIAGNOSIS — J9612 Chronic respiratory failure with hypercapnia: Secondary | ICD-10-CM | POA: Diagnosis not present

## 2024-04-24 DIAGNOSIS — J9611 Chronic respiratory failure with hypoxia: Secondary | ICD-10-CM | POA: Diagnosis not present

## 2024-04-24 MED ORDER — ALBUTEROL SULFATE HFA 108 (90 BASE) MCG/ACT IN AERS: 2.0000 | INHALATION_SPRAY | RESPIRATORY_TRACT | 1 refills | Status: AC | PRN

## 2024-04-24 MED ORDER — BUDESONIDE-FORMOTEROL FUMARATE 160-4.5 MCG/ACT IN AERO: 2.0000 | INHALATION_SPRAY | Freq: Two times a day (BID) | RESPIRATORY_TRACT | 1 refills | Status: AC

## 2024-04-24 MED ORDER — MONTELUKAST SODIUM 10 MG PO TABS: 10.0000 mg | ORAL_TABLET | Freq: Every day | ORAL | 3 refills | Status: AC

## 2024-04-24 MED ORDER — FLUTICASONE PROPIONATE 50 MCG/ACT NA SUSP: 1.0000 | Freq: Every day | NASAL | 1 refills | Status: AC

## 2024-04-24 MED ORDER — TIOTROPIUM BROMIDE 18 MCG IN CAPS
1.0000 | ORAL_CAPSULE | Freq: Every day | RESPIRATORY_TRACT | 3 refills | Status: AC
Start: 2024-04-24 — End: ?

## 2024-04-24 NOTE — Patient Instructions (Addendum)
 Continue Ohtuvayre  nebs twice daily  Continue on dupixent  injections as scheduled   Use symbicort  2 puffs twice daily - rinse mouth out after each use   Continue spiriva  inhaler daily   Use albuterol  inhaler (Ventolin  or Pro-Air) 1-2 puffs every 4-6 hours  We will give you flu vaccine today  Follow up in 6 months, call sooner if needed

## 2024-04-24 NOTE — Progress Notes (Unsigned)
 Established Patient Pulmonology Office Visit   Subjective:  Patient ID: Taylor Vargas, male    DOB: 1961-04-24  MRN: [id number]  CC:  Chief Complaint  Patient presents with   Medical Management of Chronic Issues    Pt states SOB very easily  only takes a few steps and becomes out of breath     Discussed the use of AI scribe software for clinical note transcription with the patient, who gave verbal consent to proceed.  History of Present Illness Fermin Yan is a 63 year old male, former smoker with obesity, chronic diastolic heart failure, GERD, OSA on NIV, pancreatic cancer s/p chemotherapy, chronic sinusitis and asthma-COPD overlap syndrome who returns to pulmonary clinic for follow up.   He experiences significant fatigue and low energy levels, particularly in the mornings, following the completion of a prednisone  course last week. While on prednisone , he had improved energy levels. His current medication regimen includes Dupixent  every two weeks, Symbicort  inhaler, montelukast , Spiriva , and a special nebulizer medication, which he finds effective. He produces mucus in the mornings, contributing to his fatigue.  He has not received flu or COVID vaccines this year and faces challenges accessing healthcare facilities. His home heating system is broken, and he uses electric box heaters, which affect his breathing. He uses Flonase  and Astelin  nasal sprays, preferring the latter, and has refills for his inhalers and nasal sprays. He feels exhausted and sluggish, especially in the mornings, with no recent fever or other acute symptoms.        Review of Systems  Constitutional:  Positive for malaise/fatigue.  Respiratory:  Positive for cough and shortness of breath.   Cardiovascular:  Positive for leg swelling.      Current Outpatient Medications:    adapalene (DIFFERIN) 0.1 % cream, Apply topically at bedtime., Disp: , Rfl:    albuterol  (ACCUNEB ) 0.63 MG/3ML nebulizer solution, USE 1 VIAL  VIA NEBULIZER EVERY 6 HOURS AS NEEDED FOR WHEEZING OR SHORTNESS OF BREATH, Disp: 300 mL, Rfl: 2   allopurinol (ZYLOPRIM) 100 MG tablet, Take 100 mg by mouth daily., Disp: , Rfl:    ALPRAZolam (XANAX) 0.25 MG tablet, Take 0.25 mg by mouth 3 (three) times daily as needed for anxiety., Disp: , Rfl:    aspirin  EC 81 MG tablet, Take 1 tablet (81 mg total) by mouth daily. Swallow whole., Disp: 90 tablet, Rfl: 3   atorvastatin  (LIPITOR) 40 MG tablet, Take 1 tablet (40 mg total) by mouth daily., Disp: 90 tablet, Rfl: 3   azelastine  (ASTELIN ) 0.1 % nasal spray, Place 1 spray into both nostrils 2 (two) times daily as needed for rhinitis., Disp: 3 mL, Rfl: 3   cyanocobalamin (VITAMIN B12) 1000 MCG/ML injection, Inject 1,000 mcg into the muscle every 30 (thirty) days., Disp: , Rfl:    diclofenac Sodium (VOLTAREN) 1 % GEL, Apply 1 application topically as needed for pain., Disp: , Rfl:    Dupilumab  (DUPIXENT ) 300 MG/2ML SOAJ, Inject 300 mg into the skin every 14 (fourteen) days., Disp: 12 mL, Rfl: 1   Ensifentrine  (OHTUVAYRE ) 3 MG/2.5ML SUSP, Inhale 1 each into the lungs 2 (two) times daily., Disp: 150 mL, Rfl: 5   EPINEPHrine  (EPIPEN  2-PAK) 0.3 mg/0.3 mL IJ SOAJ injection, Inject 0.3 mg into the muscle as needed for anaphylaxis., Disp: 2 each, Rfl: 5   famotidine (PEPCID) 40 MG tablet, Take 40 mg by mouth daily as needed for heartburn or indigestion., Disp: , Rfl:    HYDROcodone -acetaminophen  (NORCO/VICODIN) 5-325 MG tablet, Take  1 tablet by mouth as needed for pain., Disp: , Rfl:    ibuprofen (ADVIL) 200 MG tablet, Take 200 mg by mouth every 6 (six) hours as needed for pain., Disp: , Rfl:    ketorolac (TORADOL) 30 MG/ML injection, Inject 1 mL into the muscle 2 (two) times a week., Disp: , Rfl:    lidocaine  (LIDODERM ) 5 %, Place 1 patch onto the skin daily., Disp: , Rfl:    LORazepam (ATIVAN) 2 MG tablet, Take 1-2 mg by mouth every 8 (eight) hours as needed for anxiety., Disp: , Rfl:    metolazone  (ZAROXOLYN )  2.5 MG tablet, Take 1 tablet (2.5 mg total) by mouth daily., Disp: 90 tablet, Rfl: 3   mupirocin ointment (BACTROBAN) 2 %, Apply 1 application topically 2 (two) times daily., Disp: , Rfl:    Na Sulfate-K Sulfate-Mg Sulf 17.5-3.13-1.6 GM/177ML SOLN, Use as directed; may use generic; goodrx card if insurance will not cover generic, Disp: 354 mL, Rfl: 0   naloxone (NARCAN) nasal spray 4 mg/0.1 mL, Place 1 spray into the nose as needed for opioid reversal., Disp: , Rfl:    nitroGLYCERIN  (NITROSTAT ) 0.4 MG SL tablet, Place 1 tablet (0.4 mg total) under the tongue every 5 (five) minutes as needed for chest pain., Disp: 25 tablet, Rfl: 11   NYSTATIN powder, Apply 1 application. topically 2 (two) times daily., Disp: , Rfl:    omeprazole (PRILOSEC) 20 MG capsule, Take 20 mg by mouth 2 (two) times daily., Disp: , Rfl:    pentoxifylline (TRENTAL) 400 MG CR tablet, Take 400 mg by mouth 2 (two) times daily., Disp: , Rfl:    potassium chloride  SA (KLOR-CON  M) 20 MEQ tablet, Take 2 tablets (40 mEq total) by mouth daily., Disp: 180 tablet, Rfl: 3   Pseudoephedrine -Guaifenesin  [phone] MG TB12, Take 1 tablet by mouth in the morning and at bedtime., Disp: 30 tablet, Rfl: 1   Semaglutide (OZEMPIC, 0.25 OR 0.5 MG/DOSE, [place]), Inject 0.25 mg into the skin once a week., Disp: , Rfl:    tamsulosin (FLOMAX) 0.4 MG CAPS capsule, Take 0.4 mg by mouth daily., Disp: , Rfl:    torsemide (DEMADEX) 10 MG tablet, Take 5-10 mg by mouth as needed for edema., Disp: , Rfl:    albuterol  (VENTOLIN  HFA) 108 (90 Base) MCG/ACT inhaler, Inhale 2 puffs into the lungs every 4 (four) hours as needed for wheezing or shortness of breath., Disp: 54 g, Rfl: 1   budesonide -formoterol  (SYMBICORT ) 160-4.5 MCG/ACT inhaler, Inhale 2 puffs into the lungs 2 (two) times daily., Disp: 30.6 g, Rfl: 1   fluticasone  (FLONASE ) 50 MCG/ACT nasal spray, Place 1 spray into both nostrils daily., Disp: 48 g, Rfl: 1   montelukast  (SINGULAIR ) 10 MG tablet, Take 1 tablet  (10 mg total) by mouth at bedtime., Disp: 90 tablet, Rfl: 3   Tiotropium Bromide  (SPIRIVA  HANDIHALER) 18 MCG CAPS, Place 1 puff into inhaler and inhale daily., Disp: 90 capsule, Rfl: 3      Objective:  BP 95/69   Pulse (!) 108   Ht 6' (1.829 m) Comment: per pt  Wt (!) 306 lb (138.8 kg)   SpO2 92%   PF (!) 4 L/min   BMI 41.50 kg/m     Physical Exam Constitutional:      General: He is not in acute distress.    Appearance: Normal appearance. He is obese.  Eyes:     General: No scleral icterus.    Conjunctiva/sclera: Conjunctivae normal.  Cardiovascular:  Rate and Rhythm: Normal rate and regular rhythm.  Pulmonary:     Breath sounds: No wheezing, rhonchi or rales.  Musculoskeletal:     Right lower leg: No edema.     Left lower leg: No edema.  Skin:    General: Skin is warm and dry.  Neurological:     General: No focal deficit present.      Diagnostic Review:       Assessment & Plan:   Assessment & Plan Asthma-COPD overlap syndrome (HCC)  Orders:   budesonide -formoterol  (SYMBICORT ) 160-4.5 MCG/ACT inhaler; Inhale 2 puffs into the lungs 2 (two) times daily.   montelukast  (SINGULAIR ) 10 MG tablet; Take 1 tablet (10 mg total) by mouth at bedtime.   Tiotropium Bromide  (SPIRIVA  HANDIHALER) 18 MCG CAPS; Place 1 puff into inhaler and inhale daily.   albuterol  (VENTOLIN  HFA) 108 (90 Base) MCG/ACT inhaler; Inhale 2 puffs into the lungs every 4 (four) hours as needed for wheezing or shortness of breath.   fluticasone  (FLONASE ) 50 MCG/ACT nasal spray; Place 1 spray into both nostrils daily.  OSA (obstructive sleep apnea)     Need for vaccination     Chronic respiratory failure with hypoxia and hypercapnia (HCC)      Assessment and Plan Assessment & Plan Encounter for influenza immunization Due for flu vaccine. Discussed vaccination importance. - Administered flu vaccine today.  Chronic obstructive pulmonary disease with chronic respiratory failure and  hypoxia Morning cough with mucus. Using electric heaters may affect symptoms. - Continue Dupixent  every two weeks. - Continue Symbicort  inhaler. - Continue montelukast . - Continue Spiriva . - Continue Ohtuvayre  nebs - Refilled Symbicort , Flonase , albuterol  inhaler, Spiriva , and Singulair . - Scheduled follow-up in six months.  Obstructive Sleep Apnea - he is to continue NIV with iVAPsAutoEPAP 5-12cmH2O, PS 6-15cmH2O with target RR 15/min and target alveolar ventilation 5.2L/min     Return in about 6 months (around 10/22/2024) for f/u visit Dr. Kara.   Dorn KATHEE Kara, MD

## 2024-04-28 ENCOUNTER — Encounter: Payer: Self-pay | Admitting: Pulmonary Disease

## 2024-05-08 ENCOUNTER — Other Ambulatory Visit: Payer: Self-pay

## 2024-05-08 ENCOUNTER — Other Ambulatory Visit: Payer: Self-pay | Admitting: Pulmonary Disease

## 2024-05-08 DIAGNOSIS — J4489 Other specified chronic obstructive pulmonary disease: Secondary | ICD-10-CM

## 2024-05-09 ENCOUNTER — Other Ambulatory Visit (HOSPITAL_COMMUNITY): Payer: Self-pay

## 2024-05-09 ENCOUNTER — Other Ambulatory Visit: Payer: Self-pay

## 2024-05-09 MED ORDER — DUPIXENT 300 MG/2ML ~~LOC~~ SOAJ
300.0000 mg | SUBCUTANEOUS | 1 refills | Status: DC
  Filled 2024-05-09: qty 12, 84d supply, fill #0
  Filled 2024-05-10: qty 4, 28d supply, fill #0
  Filled 2024-06-20: qty 4, 28d supply, fill #1

## 2024-05-09 NOTE — Telephone Encounter (Signed)
 Refill sent for DUPIXENT  to [facility]: [phone]   Dose: 300mg  [name] every 14 days   Last OV: 04/24/24 Provider: Dr. Kara  Next OV: no yet scheduled, due May 2026  Taylor Vargas, PharmD, BCPS Clinical Pharmacist  [facility]

## 2024-05-10 ENCOUNTER — Other Ambulatory Visit: Payer: Self-pay

## 2024-05-10 NOTE — Progress Notes (Signed)
 Specialty Pharmacy Refill Coordination Note  Taylor Vargas is a 63 y.o. male contacted today regarding refills of specialty medication(s) Dupilumab  (Dupixent )   Patient requested Delivery   Delivery date: 05/31/24   Verified address: [address]  [place]   Medication will be filled on: 05/30/24

## 2024-05-18 ENCOUNTER — Other Ambulatory Visit: Payer: Self-pay

## 2024-05-25 ENCOUNTER — Ambulatory Visit: Payer: Self-pay | Admitting: Pulmonary Disease

## 2024-05-25 ENCOUNTER — Encounter: Payer: Self-pay | Admitting: Pulmonary Disease

## 2024-05-25 ENCOUNTER — Ambulatory Visit

## 2024-05-25 ENCOUNTER — Ambulatory Visit (INDEPENDENT_AMBULATORY_CARE_PROVIDER_SITE_OTHER): Admitting: Pulmonary Disease

## 2024-05-25 DIAGNOSIS — Z9989 Dependence on other enabling machines and devices: Secondary | ICD-10-CM | POA: Diagnosis not present

## 2024-05-25 DIAGNOSIS — B379 Candidiasis, unspecified: Secondary | ICD-10-CM | POA: Diagnosis not present

## 2024-05-25 DIAGNOSIS — J9612 Chronic respiratory failure with hypercapnia: Secondary | ICD-10-CM | POA: Diagnosis not present

## 2024-05-25 DIAGNOSIS — J9611 Chronic respiratory failure with hypoxia: Secondary | ICD-10-CM

## 2024-05-25 DIAGNOSIS — G4733 Obstructive sleep apnea (adult) (pediatric): Secondary | ICD-10-CM | POA: Diagnosis not present

## 2024-05-25 DIAGNOSIS — J441 Chronic obstructive pulmonary disease with (acute) exacerbation: Secondary | ICD-10-CM

## 2024-05-25 DIAGNOSIS — Z9911 Dependence on respirator [ventilator] status: Secondary | ICD-10-CM

## 2024-05-25 MED ORDER — LEVOFLOXACIN 750 MG PO TABS: 750.0000 mg | ORAL_TABLET | Freq: Every day | ORAL | 0 refills | Status: AC

## 2024-05-25 MED ORDER — PREDNISONE 10 MG PO TABS: ORAL_TABLET | ORAL | 0 refills | Status: DC

## 2024-05-25 NOTE — Telephone Encounter (Signed)
 FYI Only or Action Required?: FYI only for provider: appointment scheduled on this afternoon.  Patient was last seen in primary care on.  Called Nurse Triage reporting Shortness of Breath. O2 levels drop when he removes O2  Symptoms began several days ago.  Interventions attempted: Wearing o2 constantly.  Symptoms are: gradually worsening.  Triage Disposition: No disposition on file.  Patient/caregiver understands and will follow disposition?: yes                         E2C2 Pulmonary Triage - Initial Assessment Questions Chief Complaint (e.g., cough, sob, wheezing, fever, chills, sweat or additional symptoms) *Go to specific symptom protocol after initial questions. SOB  How long have symptoms been present? Friday  Have you tested for COVID or Flu? Note: If not, ask patient if Vargas home test can be taken. If so, instruct patient to call back for positive results. No  MEDICINES:   Have you used any OTC meds to help with symptoms? No If yes, ask What medications? Did not ask  Have you used your inhalers/maintenance medication?  If yes, What medications? Did not ask  If inhaler, ask How many puffs and how often? Note: Review instructions on medication in the chart. Did not ask  OXYGEN : Do you wear supplemental oxygen ? Yes If yes, How many liters are you supposed to use? 3L  Do you monitor your oxygen  levels? Yes If yes, What is your reading (oxygen  level) today? 98  What is your usual oxygen  saturation reading?  (Note: Pulmonary O2 sats should be 90% or greater) unsure                      Copied from CRM #[id number]. Topic: Clinical - Red Word Triage >> May 25, 2024  1:13 PM Taylor Vargas wrote: [facility] that prompted transfer to Nurse Triage: having breathing issues - O2 is dropping into 70s, O2 machine throwing codes when he's sleeping stating that pt has stopped breathing, SOB - this has been persistent  since Friday and has been getting worse over the past few days Reason for Disposition  [1] Longstanding difficulty breathing (e.g., CHF, COPD, emphysema) AND [2] WORSE than normal  Answer Assessment - Initial Assessment Questions 1. RESPIRATORY STATUS: Describe your breathing? (e.g., wheezing, shortness of breath, unable to speak, severe coughing)      O2 levels drop when he removes o2 2. ONSET: When did this breathing problem begin?      friday 3. PATTERN Does the difficult breathing come and go, or has it been constant since it started?      Constant and worsening 4. SEVERITY: How bad is your breathing? (e.g., mild, moderate, severe)      moderate 5. RECURRENT SYMPTOM: Have you had difficulty breathing before? If Yes, ask: When was the last time? and What happened that time?      yes 6. CARDIAC HISTORY: Do you have any history of heart disease? (e.g., heart attack, angina, bypass surgery, angioplasty)      yes 7. LUNG HISTORY: Do you have any history of lung disease?  (e.g., pulmonary embolus, asthma, emphysema)     yes 8. CAUSE: What do you think is causing the breathing problem?      Recently completed antibiotics and steroids - issues started after completing 9. OTHER SYMPTOMS: Do you have any other symptoms? (e.g., chest pain, cough, dizziness, fever, runny nose)     no 10. O2 SATURATION MONITOR:  Do you use an oxygen  saturation monitor (pulse oximeter) at home? If Yes, ask: What is your reading (oxygen  level) today? What is your usual oxygen  saturation reading? (e.g., 95%)       88  Protocols used: Breathing Difficulty-Vargas-AH

## 2024-05-25 NOTE — Progress Notes (Signed)
 Established Patient Pulmonology Office Visit   Subjective:  Patient ID: Taylor Vargas, male    DOB: Oct 23, 1960  MRN: [id number]  CC:  Chief Complaint  Patient presents with   Medical Management of Chronic Issues    Pt states 2 rounds of antibiotics / steroids - infection (?)  after this past weekend SOB  / very weak and tired    Discussed the use of AI scribe software for clinical note transcription with the patient, who gave verbal consent to proceed.  History of Present Illness         ROS   Current Medications[1]      Objective:  BP 128/78   Pulse (!) 105   Ht 6' (1.829 m) Comment: per pt  Wt (!) 306 lb (138.8 kg) Comment: per pt  SpO2 92%   PF (!) 4 L/min   BMI 41.50 kg/m     Physical Exam   Diagnostic Review:       Assessment & Plan:   Assessment & Plan   Assessment and Plan Assessment & Plan       No follow-ups on file.   Dorn KATHEE Chill, MD     [1]  Current Outpatient Medications:    adapalene (DIFFERIN) 0.1 % cream, Apply topically at bedtime., Disp: , Rfl:    albuterol  (ACCUNEB ) 0.63 MG/3ML nebulizer solution, USE 1 VIAL VIA NEBULIZER EVERY 6 HOURS AS NEEDED FOR WHEEZING OR SHORTNESS OF BREATH, Disp: 300 mL, Rfl: 2   albuterol  (VENTOLIN  HFA) 108 (90 Base) MCG/ACT inhaler, Inhale 2 puffs into the lungs every 4 (four) hours as needed for wheezing or shortness of breath., Disp: 54 g, Rfl: 1   allopurinol (ZYLOPRIM) 100 MG tablet, Take 100 mg by mouth daily., Disp: , Rfl:    ALPRAZolam (XANAX) 0.25 MG tablet, Take 0.25 mg by mouth 3 (three) times daily as needed for anxiety., Disp: , Rfl:    aspirin  EC 81 MG tablet, Take 1 tablet (81 mg total) by mouth daily. Swallow whole., Disp: 90 tablet, Rfl: 3   atorvastatin  (LIPITOR) 40 MG tablet, Take 1 tablet (40 mg total) by mouth daily., Disp: 90 tablet, Rfl: 3   azelastine  (ASTELIN ) 0.1 % nasal spray, Place 1 spray into both nostrils 2 (two) times daily as needed for rhinitis., Disp:  3 mL, Rfl: 3   budesonide -formoterol  (SYMBICORT ) 160-4.5 MCG/ACT inhaler, Inhale 2 puffs into the lungs 2 (two) times daily., Disp: 30.6 g, Rfl: 1   cyanocobalamin (VITAMIN B12) 1000 MCG/ML injection, Inject 1,000 mcg into the muscle every 30 (thirty) days., Disp: , Rfl:    diclofenac Sodium (VOLTAREN) 1 % GEL, Apply 1 application topically as needed for pain., Disp: , Rfl:    Dupilumab  (DUPIXENT ) 300 MG/2ML SOAJ, Inject 300 mg into the skin every 14 (fourteen) days., Disp: 12 mL, Rfl: 1   Ensifentrine  (OHTUVAYRE ) 3 MG/2.5ML SUSP, Inhale 1 each into the lungs 2 (two) times daily., Disp: 150 mL, Rfl: 5   EPINEPHrine  (EPIPEN  2-PAK) 0.3 mg/0.3 mL IJ SOAJ injection, Inject 0.3 mg into the muscle as needed for anaphylaxis., Disp: 2 each, Rfl: 5   famotidine (PEPCID) 40 MG tablet, Take 40 mg by mouth daily as needed for heartburn or indigestion., Disp: , Rfl:    fluticasone  (FLONASE ) 50 MCG/ACT nasal spray, Place 1 spray into both nostrils daily., Disp: 48 g, Rfl: 1   HYDROcodone -acetaminophen  (NORCO/VICODIN) 5-325 MG tablet, Take 1 tablet by mouth as needed for pain., Disp: , Rfl:  ibuprofen (ADVIL) 200 MG tablet, Take 200 mg by mouth every 6 (six) hours as needed for pain., Disp: , Rfl:    ketorolac (TORADOL) 30 MG/ML injection, Inject 1 mL into the muscle 2 (two) times a week., Disp: , Rfl:    lidocaine  (LIDODERM ) 5 %, Place 1 patch onto the skin daily., Disp: , Rfl:    LORazepam (ATIVAN) 2 MG tablet, Take 1-2 mg by mouth every 8 (eight) hours as needed for anxiety., Disp: , Rfl:    metolazone  (ZAROXOLYN ) 2.5 MG tablet, Take 1 tablet (2.5 mg total) by mouth daily., Disp: 90 tablet, Rfl: 3   montelukast  (SINGULAIR ) 10 MG tablet, Take 1 tablet (10 mg total) by mouth at bedtime., Disp: 90 tablet, Rfl: 3   mupirocin ointment (BACTROBAN) 2 %, Apply 1 application topically 2 (two) times daily., Disp: , Rfl:    Na Sulfate-K Sulfate-Mg Sulf 17.5-3.13-1.6 GM/177ML SOLN, Use as directed; may  use generic; goodrx card if insurance will not cover generic, Disp: 354 mL, Rfl: 0   naloxone (NARCAN) nasal spray 4 mg/0.1 mL, Place 1 spray into the nose as needed for opioid reversal., Disp: , Rfl:    nitroGLYCERIN  (NITROSTAT ) 0.4 MG SL tablet, Place 1 tablet (0.4 mg total) under the tongue every 5 (five) minutes as needed for chest pain., Disp: 25 tablet, Rfl: 11   NYSTATIN powder, Apply 1 application. topically 2 (two) times daily., Disp: , Rfl:    omeprazole (PRILOSEC) 20 MG capsule, Take 20 mg by mouth 2 (two) times daily., Disp: , Rfl:    pentoxifylline (TRENTAL) 400 MG CR tablet, Take 400 mg by mouth 2 (two) times daily., Disp: , Rfl:    potassium chloride  SA (KLOR-CON  M) 20 MEQ tablet, Take 2 tablets (40 mEq total) by mouth daily., Disp: 180 tablet, Rfl: 3   Pseudoephedrine -Guaifenesin  [phone] MG TB12, Take 1 tablet by mouth in the morning and at bedtime., Disp: 30 tablet, Rfl: 1   Semaglutide (OZEMPIC, 0.25 OR 0.5 MG/DOSE, [place]), Inject 0.25 mg into the skin once a week., Disp: , Rfl:    tamsulosin (FLOMAX) 0.4 MG CAPS capsule, Take 0.4 mg by mouth daily., Disp: , Rfl:    Tiotropium Bromide  (SPIRIVA  HANDIHALER) 18 MCG CAPS, Place 1 puff into inhaler and inhale daily., Disp: 90 capsule, Rfl: 3   torsemide (DEMADEX) 10 MG tablet, Take 5-10 mg by mouth as needed for edema., Disp: , Rfl:

## 2024-05-25 NOTE — Patient Instructions (Addendum)
 Start prednisone  taper: 40mg  daily x 3 days 30mg  daily x 3 days 20mg  daily x 3 days 10mg  daily x 3 days  Start levaquin antibiotic 1 tablet daily for 10 days  Take dilfucan 1 tablet as needed for yeast infection  Continue Ohtuvayre  nebs twice daily  Continue on dupixent  injections as scheduled   Use symbicort  2 puffs twice daily - rinse mouth out after each use   Continue spiriva  inhaler daily   Use albuterol  inhaler (Ventolin  or Pro-Air) 1-2 puffs every 4-6 hours  Follow up 12/22, can push out if doing better.

## 2024-05-25 NOTE — Progress Notes (Signed)
 Established Patient Pulmonology Office Visit   Subjective:  Patient ID: Taylor Vargas, male    DOB: 1960/07/06  MRN: [id number]  CC:  Chief Complaint  Patient presents with   Medical Management of Chronic Issues    Pt states 2 rounds of antibiotics / steroids - infection (?)  after this past weekend SOB  / very weak and tired    Discussed the use of AI scribe software for clinical note transcription with the patient, who gave verbal consent to proceed.  History of Present Illness Taylor Vargas is a 63 year old male, former smoker with obesity, chronic diastolic heart failure, GERD, OSA on NIV, pancreatic cancer s/p chemotherapy, chronic sinusitis and asthma-COPD overlap syndrome who returns to pulmonary clinic for acute visit.   He has had worsening difficulty breathing for three to five weeks after an upper respiratory infection treated with multiple courses of antibiotics and prednisone . Initial earache and sore throat improved after a 10-day course, then symptoms recurred within days with sinus congestion and earache, prompting another course of antibiotics and steroids.  Despite these treatments he has persistent dyspnea and weakness, describing a sensation of choking and not getting enough air. Since this weekend he has used his NIV almost continuously, removing it only briefly to eat or use the restroom. When he removed NIV to eat, his oxygen  saturation dropped to 74%. He denies fevers or chills. He feels anxious about his breathing but has avoided Xanax and Vicodin due to concern they could worsen his condition.  He has received doxycycline and cefdinir and two course of prednisone  40mg  daily for 5 days in the past month, but has not started the most recent cefdinir and prednisone  prescriptions. He develops yeast rashes with antibiotics and uses Diflucan for this. Flu and COVID-19 tests have been negative.        ROS   Current Medications[1]      Objective:  BP 128/78   Pulse  (!) 105   Ht 6' (1.829 m) Comment: per pt  Wt (!) 306 lb (138.8 kg) Comment: per pt  SpO2 92%   PF (!) 4 L/min   BMI 41.50 kg/m     Physical Exam Constitutional:      General: He is not in acute distress.    Appearance: Normal appearance. He is obese.  Eyes:     General: No scleral icterus.    Conjunctiva/sclera: Conjunctivae normal.  Cardiovascular:     Rate and Rhythm: Normal rate and regular rhythm.  Pulmonary:     Breath sounds: Decreased air movement present. No wheezing, rhonchi or rales.  Musculoskeletal:     Right lower leg: No edema.     Left lower leg: No edema.  Skin:    General: Skin is warm and dry.  Neurological:     General: No focal deficit present.      Diagnostic Review:  Last CBC Lab Results  Component Value Date   WBC 14.9 (H) 06/23/2023   HGB 15.6 06/23/2023   HCT 47.3 06/23/2023   MCV 92 06/23/2023   MCH 30.4 06/23/2023   RDW 12.8 06/23/2023   PLT 311 06/23/2023   Last metabolic panel Lab Results  Component Value Date   GLUCOSE 123 (H) 03/23/2024   NA 138 03/23/2024   K 3.7 03/23/2024   CL 97 03/23/2024   CO2 25 03/23/2024   BUN 13 03/23/2024   CREATININE 1.16 03/23/2024   EGFR 70 03/23/2024   CALCIUM  9.3 03/23/2024   PROT  7.0 09/17/2023   ALBUMIN 4.2 09/17/2023   LABGLOB 2.7 06/23/2023   BILITOT 0.4 09/17/2023   ALKPHOS 105 09/17/2023   AST 17 09/17/2023   ALT 22 09/17/2023   ANIONGAP 17 (H) 04/23/2020       Assessment & Plan:   Assessment & Plan Chronic respiratory failure with hypoxia and hypercapnia (HCC)  Orders:   Comp Met (CMET); Future   CBC with Differential/Platelet; Future   B Nat Peptide; Future   DG Chest 2 View; Future  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   levofloxacin (LEVAQUIN) 750 MG tablet; Take 1 tablet (750 mg total) by mouth daily.  Yeast infection  Orders:   fluconazole (DIFLUCAN) 150 MG tablet; Take 1 tablet (150 mg total) by mouth daily.   Assessment and Plan Assessment & Plan Asthma-COPD with acute exacerbation Acute exacerbation with increased dyspnea, hypoxia, and hypercapnia. Recent treatments ineffective. Concerns about CO2 clearance with current noninvasive ventilation. - Recommended going to hospital for further evaluation and likely admission for acute on chronic hypoxemic/hypercapnic respiratory. - Patient has refused to go to the hospital at this time and prefers to do what can be done as an outpatient. - Ordered chest x-ray. - Ordered CMP, CBC and BNP - Prescribed Levaquin 1 tablet daily for 10 days. - Prescribed a 12-day steroid taper. - Continue dupixent  - Continue Ohtuvayre  - Continue symbicort  and spiriva  - use albuterol  inhaler as needed   Chronic respiratory failure with hypoxia and hypercapnia Chronic respiratory failure exacerbated by recent illness. Concerns about CO2 retention and potential need for ventilatory support. - If his condition worsens he needs to go to the hospital  Dependence on noninvasive ventilation Increased frequency of apneic episodes. Concerns about adequacy of current settings. - Continue NIV therapy - Will consider hospital admission for potential adjustment of ventilation settings or need for invasive ventilation.  Obstructive sleep apnea Managed with NIV.  - Continue BiPAP therapy.  Candidiasis Recurrent candidiasis likely secondary to antibiotic use. - Prescribed Diflucan to manage yeast rash.      Return for f/u visit Dr. Kara.   Dorn KATHEE Kara, MD     [1]  Current Outpatient Medications:    adapalene  (DIFFERIN) 0.1 % cream, Apply topically at bedtime., Disp: , Rfl:    albuterol  (ACCUNEB ) 0.63 MG/3ML nebulizer solution, USE 1 VIAL VIA NEBULIZER EVERY 6 HOURS AS NEEDED FOR WHEEZING OR SHORTNESS OF BREATH, Disp: 300 mL, Rfl: 2   albuterol  (VENTOLIN  HFA) 108 (90 Base) MCG/ACT inhaler, Inhale 2 puffs into the lungs every 4 (four) hours as needed for wheezing or shortness of breath., Disp: 54 g, Rfl: 1   allopurinol (ZYLOPRIM) 100 MG tablet, Take 100 mg by mouth daily., Disp: , Rfl:    ALPRAZolam (XANAX) 0.25 MG tablet, Take 0.25 mg by mouth 3 (three) times daily as needed for anxiety., Disp: , Rfl:    aspirin  EC 81 MG tablet, Take 1 tablet (81 mg total) by mouth daily. Swallow whole., Disp: 90 tablet, Rfl: 3   atorvastatin  (LIPITOR) 40 MG tablet, Take 1  tablet (40 mg total) by mouth daily., Disp: 90 tablet, Rfl: 3   azelastine  (ASTELIN ) 0.1 % nasal spray, Place 1 spray into both nostrils 2 (two) times daily as needed for rhinitis., Disp: 3 mL, Rfl: 3   budesonide -formoterol  (SYMBICORT ) 160-4.5 MCG/ACT inhaler, Inhale 2 puffs into the lungs 2 (two) times daily., Disp: 30.6 g, Rfl: 1   cyanocobalamin (VITAMIN B12) 1000 MCG/ML injection, Inject 1,000 mcg into the muscle every 30 (thirty) days., Disp: , Rfl:    diclofenac Sodium (VOLTAREN) 1 % GEL, Apply 1 application topically as needed for pain., Disp: , Rfl:    Dupilumab  (DUPIXENT ) 300 MG/2ML SOAJ, Inject 300 mg into the skin every 14 (fourteen) days., Disp: 12 mL, Rfl: 1   Ensifentrine  (OHTUVAYRE ) 3 MG/2.5ML SUSP, Inhale 1 each into the lungs 2 (two) times daily., Disp: 150 mL, Rfl: 5   EPINEPHrine  (EPIPEN  2-PAK) 0.3 mg/0.3 mL IJ SOAJ injection, Inject 0.3 mg into the muscle as needed for anaphylaxis., Disp: 2 each, Rfl: 5   famotidine (PEPCID) 40 MG tablet, Take 40 mg by mouth daily as needed for heartburn or indigestion., Disp: , Rfl:    fluconazole (DIFLUCAN) 150 MG tablet, Take 1 tablet (150 mg total) by mouth daily., Disp: 1 tablet, Rfl: 0    fluticasone  (FLONASE ) 50 MCG/ACT nasal spray, Place 1 spray into both nostrils daily., Disp: 48 g, Rfl: 1   HYDROcodone -acetaminophen  (NORCO/VICODIN) 5-325 MG tablet, Take 1 tablet by mouth as needed for pain., Disp: , Rfl:    ibuprofen (ADVIL) 200 MG tablet, Take 200 mg by mouth every 6 (six) hours as needed for pain., Disp: , Rfl:    ketorolac (TORADOL) 30 MG/ML injection, Inject 1 mL into the muscle 2 (two) times a week., Disp: , Rfl:    levofloxacin (LEVAQUIN) 750 MG tablet, Take 1 tablet (750 mg total) by mouth daily., Disp: 10 tablet, Rfl: 0   lidocaine  (LIDODERM ) 5 %, Place 1 patch onto the skin daily., Disp: , Rfl:    LORazepam (ATIVAN) 2 MG tablet, Take 1-2 mg by mouth every 8 (eight) hours as needed for anxiety., Disp: , Rfl:    metolazone  (ZAROXOLYN ) 2.5 MG tablet, Take 1 tablet (2.5 mg total) by mouth daily., Disp: 90 tablet, Rfl: 3   montelukast  (SINGULAIR ) 10 MG tablet, Take 1 tablet (10 mg total) by mouth at bedtime., Disp: 90 tablet, Rfl: 3   mupirocin ointment (BACTROBAN) 2 %, Apply 1 application topically 2 (two) times daily., Disp: , Rfl:    Na Sulfate-K Sulfate-Mg Sulf 17.5-3.13-1.6 GM/177ML SOLN, Use as directed; may use generic; goodrx card if insurance will not cover generic, Disp: 354 mL, Rfl: 0   naloxone (NARCAN) nasal spray 4 mg/0.1 mL, Place 1 spray into the nose as needed for opioid reversal., Disp: , Rfl:    nitroGLYCERIN  (NITROSTAT ) 0.4 MG SL tablet, Place 1 tablet (0.4 mg total) under the tongue every 5 (five) minutes as needed for chest pain., Disp: 25 tablet, Rfl: 11   NYSTATIN powder, Apply 1 application. topically 2 (two) times daily., Disp: , Rfl:    omeprazole (PRILOSEC) 20 MG capsule, Take 20 mg by mouth 2 (two) times daily., Disp: , Rfl:    pentoxifylline (TRENTAL) 400 MG CR tablet, Take 400 mg by mouth 2 (two) times daily., Disp: , Rfl:    potassium chloride  SA (KLOR-CON  M) 20 MEQ tablet, Take 2 tablets (40 mEq total) by mouth daily., Disp: 180 tablet, Rfl:  3   predniSONE  (DELTASONE ) 10 MG tablet,  Take 4 tablets (40 mg total) by mouth daily with breakfast for 3 days, THEN 3 tablets (30 mg total) daily with breakfast for 3 days, THEN 2 tablets (20 mg total) daily with breakfast for 3 days, THEN 1 tablet (10 mg total) daily with breakfast for 3 days., Disp: 30 tablet, Rfl: 0   Pseudoephedrine -Guaifenesin  [phone] MG TB12, Take 1 tablet by mouth in the morning and at bedtime., Disp: 30 tablet, Rfl: 1   Semaglutide (OZEMPIC, 0.25 OR 0.5 MG/DOSE, [place]), Inject 0.25 mg into the skin once a week., Disp: , Rfl:    tamsulosin (FLOMAX) 0.4 MG CAPS capsule, Take 0.4 mg by mouth daily., Disp: , Rfl:    Tiotropium Bromide  (SPIRIVA  HANDIHALER) 18 MCG CAPS, Place 1 puff into inhaler and inhale daily., Disp: 90 capsule, Rfl: 3   torsemide (DEMADEX) 10 MG tablet, Take 5-10 mg by mouth as needed for edema., Disp: , Rfl:

## 2024-05-26 ENCOUNTER — Telehealth: Payer: Self-pay | Admitting: Pulmonary Disease

## 2024-05-26 LAB — COMPREHENSIVE METABOLIC PANEL WITH GFR
ALT: 20 U/L (ref 0–53)
AST: 16 U/L (ref 0–37)
Albumin: 3.6 g/dL (ref 3.5–5.2)
Alkaline Phosphatase: 77 U/L (ref 39–117)
BUN: 12 mg/dL (ref 6–23)
CO2: 37 meq/L — ABNORMAL HIGH (ref 19–32)
Calcium: 9.2 mg/dL (ref 8.4–10.5)
Chloride: 97 meq/L (ref 96–112)
Creatinine, Ser: 1.15 mg/dL (ref 0.40–1.50)
GFR: 66.67 mL/min (ref >60.00)
Glucose, Bld: 156 mg/dL — ABNORMAL HIGH (ref 70–99)
Potassium: 3.5 meq/L (ref 3.5–5.1)
Sodium: 141 meq/L (ref 135–145)
Total Bilirubin: 0.4 mg/dL (ref 0.2–1.2)
Total Protein: 6.7 g/dL (ref 6.0–8.3)

## 2024-05-26 LAB — CBC WITH DIFFERENTIAL/PLATELET
Basophils Absolute: 0.1 K/uL (ref 0.0–0.1)
Basophils Relative: 1.3 % (ref 0.0–3.0)
Eosinophils Absolute: 0.5 K/uL (ref 0.0–0.7)
Eosinophils Relative: 4.6 % (ref 0.0–5.0)
HCT: 41.3 % (ref 39.0–52.0)
Hemoglobin: 13.6 g/dL (ref 13.0–17.0)
Lymphocytes Relative: 26.3 % (ref 12.0–46.0)
Lymphs Abs: 2.6 K/uL (ref 0.7–4.0)
MCHC: 32.9 g/dL (ref 30.0–36.0)
MCV: 82.3 fl (ref 78.0–100.0)
Monocytes Absolute: 0.9 K/uL (ref 0.1–1.0)
Monocytes Relative: 8.8 % (ref 3.0–12.0)
Neutro Abs: 5.8 K/uL (ref 1.4–7.7)
Neutrophils Relative %: 59 % (ref 43.0–77.0)
Platelets: 300 K/uL (ref 150.0–400.0)
RBC: 5.02 Mil/uL (ref 4.22–5.81)
RDW: 17.4 % — ABNORMAL HIGH (ref 11.5–15.5)
WBC: 9.8 K/uL (ref 4.0–10.5)

## 2024-05-26 LAB — BRAIN NATRIURETIC PEPTIDE: Pro B Natriuretic peptide (BNP): 12 pg/mL (ref 0.0–100.0)

## 2024-05-26 NOTE — Telephone Encounter (Signed)
 Reviewed labs with patient concerning for elevated bicarb level concerning for progressive worsening hypercapnia. Also discussed that his symptoms could be due to blood clots. Again discussed going to the hospital but he is trying to avoid it at all costs. He is discussing with his brother and may consider going to hospital on Monday when I am on service.  Dorn Chill, MD [facility] Office: [phone]   See Amion for personal pager PCCM on call pager [phone] until 7pm. Please call Elink 7p-7a. [phone]

## 2024-05-29 ENCOUNTER — Other Ambulatory Visit: Payer: Self-pay

## 2024-05-29 ENCOUNTER — Encounter (HOSPITAL_COMMUNITY): Payer: Self-pay

## 2024-05-29 ENCOUNTER — Ambulatory Visit: Payer: Self-pay | Admitting: Pulmonary Disease

## 2024-05-29 ENCOUNTER — Emergency Department (HOSPITAL_COMMUNITY)

## 2024-05-29 ENCOUNTER — Emergency Department (HOSPITAL_COMMUNITY)
Admission: EM | Admit: 2024-05-29 | Discharge: 2024-05-29 | Discharge status: Against Medical Advice | Attending: Emergency Medicine | Admitting: Emergency Medicine

## 2024-05-29 LAB — COMPREHENSIVE METABOLIC PANEL WITH GFR
ALT: 18 U/L (ref 0–44)
AST: 19 U/L (ref 15–41)
Albumin: 3.3 g/dL — ABNORMAL LOW (ref 3.5–5.0)
Alkaline Phosphatase: 70 U/L (ref 38–126)
Anion gap: 9 (ref 5–15)
BUN: 21 mg/dL (ref 8–23)
CO2: 31 mmol/L (ref 22–32)
Calcium: 9.1 mg/dL (ref 8.9–10.3)
Chloride: 99 mmol/L (ref 98–111)
Creatinine, Ser: 1.33 mg/dL — ABNORMAL HIGH (ref 0.61–1.24)
GFR, Estimated: 59 mL/min — ABNORMAL LOW (ref >60)
Glucose, Bld: 150 mg/dL — ABNORMAL HIGH (ref 70–99)
Potassium: 4.2 mmol/L (ref 3.5–5.1)
Sodium: 139 mmol/L (ref 135–145)
Total Bilirubin: 0.8 mg/dL (ref 0.0–1.2)
Total Protein: 6.9 g/dL (ref 6.5–8.1)

## 2024-05-29 LAB — RESP PANEL BY RT-PCR (RSV, FLU A&B, COVID)  RVPGX2
Influenza A by PCR: NEGATIVE
Influenza B by PCR: NEGATIVE
Resp Syncytial Virus by PCR: NEGATIVE
SARS Coronavirus 2 by RT PCR: NEGATIVE

## 2024-05-29 LAB — CBC
HCT: 45.3 % (ref 39.0–52.0)
Hemoglobin: 13.9 g/dL (ref 13.0–17.0)
MCH: 25.8 pg — ABNORMAL LOW (ref 26.0–34.0)
MCHC: 30.7 g/dL (ref 30.0–36.0)
MCV: 84.2 fL (ref 80.0–100.0)
Platelets: 344 K/uL (ref 150–400)
RBC: 5.38 MIL/uL (ref 4.22–5.81)
RDW: 16.4 % — ABNORMAL HIGH (ref 11.5–15.5)
WBC: 15.6 K/uL — ABNORMAL HIGH (ref 4.0–10.5)
nRBC: 0 % (ref 0.0–0.2)

## 2024-05-29 LAB — TROPONIN I (HIGH SENSITIVITY): Troponin I (High Sensitivity): 5 ng/L (ref <18)

## 2024-05-29 LAB — BRAIN NATRIURETIC PEPTIDE: B Natriuretic Peptide: 27.4 pg/mL (ref 0.0–100.0)

## 2024-05-29 NOTE — Telephone Encounter (Signed)
 [facility] Pulmonary Triage - Initial Assessment Questions Chief Complaint (e.g., cough, sob, wheezing, fever, chills, sweat or additional symptoms) *Go to specific symptom protocol after initial questions. Patient is currently at [facility] ED and requesting Dr. Kara to call back.   Copied from CRM #[id number]. Topic: Clinical - Red Word Triage >> May 29, 2024  3:04 PM Russell PARAS wrote: Red Word that prompted transfer to Nurse Triage:   Pt's brother Adriana contacting clinic regarding pt being in the ER.  He left message this morning at 8:15 for Dewald at clinic but no response.  Dewald had contacted pt personally on Friday to advise he go to the hospital.  Brother says he was finally agreeable to come in today and they have been in the ER since 11 AM. They have not seen a nurse or ER doc.  He is trying desperately to get ahold of Dewald(as Dewald advised he would be on shift at hospital today) bc pt is wanting to leave without treatment and brother reports Kara would be the only one to talk him into staying.    [name] is closed today  Pt of Dewald >> May 29, 2024  3:09 PM Russell PARAS wrote: Advised by management to route to NT for assistance.  Answer Assessment - Initial Assessment Questions 1. REASON FOR CALL: What is the main reason for your call? or How can I best help you?     Patient currently in [facility] ED. Reports that Dr. Kara told him that he would be working in hospital and requesting to speak with Dr. Kara.  2. SYMPTOMS : Do you have any symptoms?      Pt's brother is calling and saying he's doing okay, but he's uncomfortable and may leave ED. Wants Dr. Kara to make sure that the ED is doing the proper testing.   Nurse advised patient and patient's brother to stay in ED in be evaluated.  [phone] Adriana Brooks, pt's brother, requesting call back.  Protocols used: Information Only Call - No Triage-A-AH

## 2024-05-29 NOTE — ED Provider Triage Note (Signed)
 Emergency Medicine Provider Triage Evaluation Note  Taylor Vargas , a 63 y.o. male  was evaluated in triage.  Pt complains of SOB since Thursday, worse in AM and worse with ext. Typically on O2, but needing more. Chest tightness and confusing when O2 drops. He says pulm wanted him to come here for further workup. He has been on steriods and abx ofr this.   Review of Systems  Positive: SOB Negative: Fever  Physical Exam  BP (!) 104/90 (BP Location: Left Arm)   Pulse (!) 120   Temp 97.6 F (36.4 C)   Resp 19   SpO2 92%  Gen:   Awake, no distress   Resp:  Normal effort  MSK:   Moves extremities without difficulty  Other:  Okay in room at rest on 5L [place]  Medical Decision Making  Medically screening exam initiated at 1:30 PM.  Appropriate orders placed.  Taylor Vargas was informed that the remainder of the evaluation will be completed by another provider, this initial triage assessment does not replace that evaluation, and the importance of remaining in the ED until their evaluation is complete.     Taylor Warren SAILOR, PA-C 05/29/24 1332

## 2024-05-29 NOTE — Telephone Encounter (Signed)
 Reviewed pt's chart and most recent DPR does not give permission to speak with anyone  I called and spoke with the pt  I let him know that I contacted Dr Kara to let him know he is at ED and he rec that he wait there  Pt verbalized understanding

## 2024-05-29 NOTE — ED Triage Notes (Addendum)
 Pt states his Sa02 levels have been low 72% in morning since 4 days. Pt states the highest his Sa02 has been in high 80's. Pt normally wears 3L 02 at night, but past month has had to adjust the liters of oxygen  throughout the day based on his Sa02 and has gone as high as 5L of 02. Pt states Dr Kara from [facility] is on call here and wants to be called regarding pt.

## 2024-05-29 NOTE — ED Notes (Signed)
 Pt decided to leave while waiting for a room. Asked sort staff to push him to the car. Assisted by other technician to the car.

## 2024-05-30 ENCOUNTER — Emergency Department (HOSPITAL_COMMUNITY)

## 2024-05-30 ENCOUNTER — Other Ambulatory Visit: Payer: Self-pay

## 2024-05-30 ENCOUNTER — Telehealth: Payer: Self-pay

## 2024-05-30 ENCOUNTER — Encounter (HOSPITAL_COMMUNITY): Payer: Self-pay

## 2024-05-30 ENCOUNTER — Inpatient Hospital Stay (HOSPITAL_COMMUNITY)
Admission: EM | Admit: 2024-05-30 | Discharge: 2024-05-30 | DRG: 189 | Discharge status: Against Medical Advice | Attending: Infectious Diseases | Admitting: Infectious Diseases

## 2024-05-30 DIAGNOSIS — Z6841 Body Mass Index (BMI) 40.0 and over, adult: Secondary | ICD-10-CM | POA: Diagnosis not present

## 2024-05-30 DIAGNOSIS — T380X5A Adverse effect of glucocorticoids and synthetic analogues, initial encounter: Secondary | ICD-10-CM | POA: Diagnosis present

## 2024-05-30 DIAGNOSIS — J329 Chronic sinusitis, unspecified: Secondary | ICD-10-CM | POA: Diagnosis present

## 2024-05-30 DIAGNOSIS — Q600 Renal agenesis, unilateral: Secondary | ICD-10-CM | POA: Diagnosis not present

## 2024-05-30 DIAGNOSIS — J441 Chronic obstructive pulmonary disease with (acute) exacerbation: Secondary | ICD-10-CM

## 2024-05-30 DIAGNOSIS — Z7982 Long term (current) use of aspirin: Secondary | ICD-10-CM | POA: Diagnosis not present

## 2024-05-30 DIAGNOSIS — J439 Emphysema, unspecified: Secondary | ICD-10-CM | POA: Diagnosis present

## 2024-05-30 DIAGNOSIS — E785 Hyperlipidemia, unspecified: Secondary | ICD-10-CM | POA: Diagnosis present

## 2024-05-30 DIAGNOSIS — Z7985 Long-term (current) use of injectable non-insulin antidiabetic drugs: Secondary | ICD-10-CM | POA: Diagnosis not present

## 2024-05-30 DIAGNOSIS — G4733 Obstructive sleep apnea (adult) (pediatric): Secondary | ICD-10-CM | POA: Diagnosis present

## 2024-05-30 DIAGNOSIS — I5032 Chronic diastolic (congestive) heart failure: Secondary | ICD-10-CM | POA: Diagnosis present

## 2024-05-30 DIAGNOSIS — K219 Gastro-esophageal reflux disease without esophagitis: Secondary | ICD-10-CM | POA: Diagnosis present

## 2024-05-30 DIAGNOSIS — J9601 Acute respiratory failure with hypoxia: Principal | ICD-10-CM | POA: Diagnosis present

## 2024-05-30 DIAGNOSIS — Z5329 Procedure and treatment not carried out because of patient's decision for other reasons: Secondary | ICD-10-CM | POA: Diagnosis present

## 2024-05-30 DIAGNOSIS — Z825 Family history of asthma and other chronic lower respiratory diseases: Secondary | ICD-10-CM | POA: Diagnosis not present

## 2024-05-30 DIAGNOSIS — Z7951 Long term (current) use of inhaled steroids: Secondary | ICD-10-CM | POA: Diagnosis not present

## 2024-05-30 DIAGNOSIS — J9621 Acute and chronic respiratory failure with hypoxia: Principal | ICD-10-CM

## 2024-05-30 DIAGNOSIS — Z9981 Dependence on supplemental oxygen: Secondary | ICD-10-CM | POA: Diagnosis not present

## 2024-05-30 DIAGNOSIS — Z87891 Personal history of nicotine dependence: Secondary | ICD-10-CM | POA: Diagnosis not present

## 2024-05-30 DIAGNOSIS — Z9221 Personal history of antineoplastic chemotherapy: Secondary | ICD-10-CM | POA: Diagnosis not present

## 2024-05-30 DIAGNOSIS — Z833 Family history of diabetes mellitus: Secondary | ICD-10-CM | POA: Diagnosis not present

## 2024-05-30 DIAGNOSIS — M170 Bilateral primary osteoarthritis of knee: Secondary | ICD-10-CM | POA: Diagnosis present

## 2024-05-30 DIAGNOSIS — Z803 Family history of malignant neoplasm of breast: Secondary | ICD-10-CM | POA: Diagnosis not present

## 2024-05-30 DIAGNOSIS — Z79899 Other long term (current) drug therapy: Secondary | ICD-10-CM | POA: Diagnosis not present

## 2024-05-30 LAB — CBC
HCT: 44.5 % (ref 39.0–52.0)
Hemoglobin: 13.9 g/dL (ref 13.0–17.0)
MCH: 26.3 pg (ref 26.0–34.0)
MCHC: 31.2 g/dL (ref 30.0–36.0)
MCV: 84.3 fL (ref 80.0–100.0)
Platelets: 312 K/uL (ref 150–400)
RBC: 5.28 MIL/uL (ref 4.22–5.81)
RDW: 16.2 % — ABNORMAL HIGH (ref 11.5–15.5)
WBC: 15.8 K/uL — ABNORMAL HIGH (ref 4.0–10.5)
nRBC: 0 % (ref 0.0–0.2)

## 2024-05-30 LAB — BASIC METABOLIC PANEL WITH GFR
Anion gap: 11 (ref 5–15)
BUN: 23 mg/dL (ref 8–23)
CO2: 30 mmol/L (ref 22–32)
Calcium: 10 mg/dL (ref 8.9–10.3)
Chloride: 97 mmol/L — ABNORMAL LOW (ref 98–111)
Creatinine, Ser: 1.14 mg/dL (ref 0.61–1.24)
GFR, Estimated: >60 mL/min (ref >60)
Glucose, Bld: 142 mg/dL — ABNORMAL HIGH (ref 70–99)
Potassium: 3.9 mmol/L (ref 3.5–5.1)
Sodium: 138 mmol/L (ref 135–145)

## 2024-05-30 LAB — RESPIRATORY PANEL BY PCR

## 2024-05-30 LAB — I-STAT VENOUS BLOOD GAS, ED
Acid-Base Excess: 8 mmol/L — ABNORMAL HIGH (ref 0.0–2.0)
Bicarbonate: 31.6 mmol/L — ABNORMAL HIGH (ref 20.0–28.0)
Calcium, Ion: 1.16 mmol/L (ref 1.15–1.40)
HCT: 44 % (ref 39.0–52.0)
Hemoglobin: 15 g/dL (ref 13.0–17.0)
O2 Saturation: 84 %
Potassium: 3.7 mmol/L (ref 3.5–5.1)
Sodium: 136 mmol/L (ref 135–145)
TCO2: 33 mmol/L — ABNORMAL HIGH (ref 22–32)
pCO2, Ven: 40.2 mmHg — ABNORMAL LOW (ref 44–60)
pH, Ven: 7.503 — ABNORMAL HIGH (ref 7.25–7.43)
pO2, Ven: 44 mmHg (ref 32–45)

## 2024-05-30 LAB — D-DIMER, QUANTITATIVE: D-Dimer, Quant: <0.27 ug{FEU}/mL (ref 0.00–0.50)

## 2024-05-30 LAB — TROPONIN T, HIGH SENSITIVITY: Troponin T High Sensitivity: 19 ng/L (ref 0–19)

## 2024-05-30 LAB — PROCALCITONIN: Procalcitonin: <0.1 ng/mL

## 2024-05-30 MED ORDER — ATORVASTATIN CALCIUM 40 MG PO TABS: 40.0000 mg | ORAL_TABLET | Freq: Every day | ORAL | Status: DC

## 2024-05-30 MED ORDER — POLYETHYLENE GLYCOL 3350 17 G PO PACK: 17.0000 g | PACK | Freq: Every day | ORAL | Status: DC | PRN

## 2024-05-30 MED ORDER — IPRATROPIUM-ALBUTEROL 0.5-2.5 (3) MG/3ML IN SOLN
3.0000 mL | Freq: Once | RESPIRATORY_TRACT | Status: AC
  Administered 2024-05-30: 16:00:00 3 mL via RESPIRATORY_TRACT
  Filled 2024-05-30: qty 3

## 2024-05-30 MED ORDER — ACETAMINOPHEN 650 MG RE SUPP: 650.0000 mg | Freq: Four times a day (QID) | RECTAL | Status: DC | PRN

## 2024-05-30 MED ORDER — ASPIRIN 81 MG PO TBEC: 81.0000 mg | DELAYED_RELEASE_TABLET | Freq: Every day | ORAL | Status: DC

## 2024-05-30 MED ORDER — MONTELUKAST SODIUM 10 MG PO TABS: 10.0000 mg | ORAL_TABLET | Freq: Every day | ORAL | Status: DC

## 2024-05-30 MED ORDER — ALLOPURINOL 100 MG PO TABS: 100.0000 mg | ORAL_TABLET | Freq: Every day | ORAL | Status: DC

## 2024-05-30 MED ORDER — MAGNESIUM SULFATE 2 GM/50ML IV SOLN
2.0000 g | Freq: Once | INTRAVENOUS | Status: AC
  Administered 2024-05-30: 16:00:00 2 g via INTRAVENOUS
  Filled 2024-05-30: qty 50

## 2024-05-30 MED ORDER — IPRATROPIUM-ALBUTEROL 0.5-2.5 (3) MG/3ML IN SOLN: 3.0000 mL | RESPIRATORY_TRACT | Status: DC | PRN

## 2024-05-30 MED ORDER — TAMSULOSIN HCL 0.4 MG PO CAPS: 0.4000 mg | ORAL_CAPSULE | Freq: Every day | ORAL | Status: DC

## 2024-05-30 MED ORDER — FAMOTIDINE 20 MG PO TABS: 40.0000 mg | ORAL_TABLET | Freq: Every day | ORAL | Status: DC | PRN

## 2024-05-30 MED ORDER — METHYLPREDNISOLONE SODIUM SUCC 40 MG IJ SOLR
40.0000 mg | Freq: Once | INTRAMUSCULAR | Status: AC
  Administered 2024-05-30: 16:00:00 40 mg via INTRAVENOUS
  Filled 2024-05-30: qty 1

## 2024-05-30 MED ORDER — FLUTICASONE PROPIONATE 50 MCG/ACT NA SUSP: 1.0000 | Freq: Every day | NASAL | Status: DC

## 2024-05-30 MED ORDER — PENTOXIFYLLINE ER 400 MG PO TBCR: 400.0000 mg | EXTENDED_RELEASE_TABLET | Freq: Two times a day (BID) | ORAL | Status: DC

## 2024-05-30 MED ORDER — BUDESONIDE 0.25 MG/2ML IN SUSP
0.2500 mg | Freq: Two times a day (BID) | RESPIRATORY_TRACT | Status: DC
  Filled 2024-05-30 (×3): qty 2

## 2024-05-30 MED ORDER — IOHEXOL 350 MG/ML SOLN
100.0000 mL | Freq: Once | INTRAVENOUS | Status: AC | PRN
  Administered 2024-05-30: 15:00:00 100 mL via INTRAVENOUS

## 2024-05-30 MED ORDER — DICLOFENAC SODIUM 1 % EX GEL: 2.0000 g | CUTANEOUS | Status: DC | PRN

## 2024-05-30 MED ORDER — SENNA 8.6 MG PO TABS: 1.0000 | ORAL_TABLET | Freq: Two times a day (BID) | ORAL | Status: DC

## 2024-05-30 MED ORDER — ACETAMINOPHEN 325 MG PO TABS: 650.0000 mg | ORAL_TABLET | Freq: Four times a day (QID) | ORAL | Status: DC | PRN

## 2024-05-30 MED ORDER — RIVAROXABAN 10 MG PO TABS
10.0000 mg | ORAL_TABLET | Freq: Every day | ORAL | Status: DC
  Administered 2024-05-30: 16:00:00 10 mg via ORAL
  Filled 2024-05-30: qty 1

## 2024-05-30 MED ORDER — HYDROCODONE-ACETAMINOPHEN 5-325 MG PO TABS: 1.0000 | ORAL_TABLET | Freq: Two times a day (BID) | ORAL | Status: DC | PRN

## 2024-05-30 MED ORDER — PANTOPRAZOLE SODIUM 40 MG PO TBEC: 80.0000 mg | DELAYED_RELEASE_TABLET | Freq: Every day | ORAL | Status: DC

## 2024-05-30 MED ORDER — METOLAZONE 2.5 MG PO TABS: 2.5000 mg | ORAL_TABLET | Freq: Every day | ORAL | Status: DC

## 2024-05-30 MED ORDER — REVEFENACIN 175 MCG/3ML IN SOLN
175.0000 ug | Freq: Every day | RESPIRATORY_TRACT | Status: DC
  Administered 2024-05-30: 16:00:00 175 ug via RESPIRATORY_TRACT
  Filled 2024-05-30: qty 3

## 2024-05-30 MED ORDER — LORAZEPAM 1 MG PO TABS: 0.5000 mg | ORAL_TABLET | Freq: Three times a day (TID) | ORAL | Status: DC | PRN

## 2024-05-30 MED ORDER — PREDNISONE 20 MG PO TABS: 40.0000 mg | ORAL_TABLET | Freq: Every day | ORAL | Status: DC

## 2024-05-30 MED ORDER — ADAPALENE 0.1 % EX CREA: TOPICAL_CREAM | Freq: Every day | CUTANEOUS | Status: DC

## 2024-05-30 MED ORDER — AZELASTINE HCL 0.1 % NA SOLN: 1.0000 | Freq: Two times a day (BID) | NASAL | Status: DC | PRN

## 2024-05-30 MED ORDER — ENSIFENTRINE 3 MG/2.5ML IN SUSP: 1.0000 | Freq: Two times a day (BID) | RESPIRATORY_TRACT | Status: DC

## 2024-05-30 MED ORDER — ARFORMOTEROL TARTRATE 15 MCG/2ML IN NEBU
15.0000 ug | INHALATION_SOLUTION | Freq: Two times a day (BID) | RESPIRATORY_TRACT | Status: DC
  Filled 2024-05-30 (×3): qty 2

## 2024-05-30 NOTE — ED Notes (Signed)
 Pt left AMA and refused to sign AMA papers. Providers were able to see pt in the lobby before his departure.

## 2024-05-30 NOTE — ED Triage Notes (Addendum)
 Pt came in via POV d/t difficulty breathing & SOB. Endorses at home with his baseline O2 he was sating at 66% @ 0300 & after his meds, by 0700 he was up to the 70's. Pt states that his regular pulmonologist Dewalt, MD told him that he would like to be made aware when he comes into ED. A/Ox4, denies any CP, does have chronic 10/10 bil knee pain, have taken home pain meds for it before arrival. States that he has been taking prednisone  for the past 5 weeks.

## 2024-05-30 NOTE — H&P (Cosign Needed Addendum)
 Date: 05/30/2024               Patient Name:  Taylor Vargas MRN: [id number]  DOB: 1960/12/19 Age / Sex: 63 y.o., male   PCP: Street, Lonni HERO, MD         Medical Service: Internal Medicine Teaching Service         Attending Physician: Dr. Reyes Fenton      First Contact: Rebecka Pion, DO}    Second Contact: Dr. Toma Edwards, DO         Pager Information: First Contact Pager: [phone]   Second Contact Pager: [phone]   SUBJECTIVE   Chief Complaint: Shortness of breath  History of Present Illness: Taylor Vargas is a 63 y.o. male with PMH of chronic hypoxemic respiratory failure, asthma-COPD overlap syndrome, obesity, HFpEF, OSA on NIV, chronic sinusitis, GERD, HLD, osteoarthritis of BL knees.   Reports he woke up on Monday (05/29/2024) morning with SpO2 in the 70s which brought him to the ED then but he decided to leave while waiting for room before seen by an EDP.  He reports his alarms on the NIV machine went off this morning around 3 AM and noted that his SpO2 was 66%.  Patient felt like he had stopped breathing and felt lightheaded and decided to come to the ED again.  He reports dyspnea has been ongoing for the past 5 weeks, however, his O2 levels never dropped that low in the 70s.  Patient denies any fall, LOC, HA, CP.  Patient denies any fevers and checks his temperature every morning.  Reports he is unable to lay flat due to sinus drainage and has been chronically sleeping on 3 pillows.  He reports no bilateral lower extremity edema.  He reports completing an 7-day cefdinir course prescribed by PCP in November and then started on levofloxacin  by his pulmonologist, Dr. Kara, which he has been taking since 05/25/2024.  Patient reported being on prednisone  starting last month.  Per charting, patient has dispensed prednisone  several times starting 05/05/2024.  He reports self tapering himself, however, was confused while trying to explain the dosing.  Patient reports he is on 3 L  of supplemental oxygen  at home.  He uses NIV on a daily basis and reports that it is not CPAP.  Reports he is adherent to his home inhalers.  Mentions his oxygen  is connected to the NIV machine as well.  He also has a nebulizer machine that he uses twice daily.  He has a chronic productive cough with clear sputum.  Endorses postnasal drip which is chronic.  Denies any abdominal pain, nausea, vomiting.  Last bowel movement was this a.m.  Denies any pain in joints, sick contacts, recent travels, hemoptysis, melena.  ED Course: Labs significant for: BNP (05/29/24): 27.4 (normal) WBC: 15.8 Procalcitonin: Less than 0.1  Imaging : CXR: No acute cardiopulmonary findings; blunting of left costophrenic angle likely due to prominent cardiac fat pad which is unchanged from prior study.  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 Kara   Meds:  Patient reported:  Patient said care giver arranges every 30 days of medication in the case box.  Medications are as follows:  Adapalene  0.1% cream Albuterol  0.63 mg/3 mL nebulizer solution every 6 hours as needed for wheezing and shortness of breath Albuterol  108 mcg/ACT inhaler 2 puffs into lungs every 4 hours as needed  for wheezing and shortness of breath Allopurinol  100 mg daily Alprazolam 0.25 mg, 3 times daily as needed for anxiety Aspirin  EC 81 mg daily Atorvastatin  40 mg daily Azelastine  0.1% nasal spray as needed for rhinitis Budesonide -formoterol  inhaler (Symbicort ), inhale 2 puffs twice daily Vitamin B12 1000 mcg injection every 30 days Voltaren  gel as needed for pain Dupixent  300 mg / 2 mL injection into the skin every 14 days Epinephrine  injection as needed for anaphylaxis Famotidine  40 mg as needed for heartburn/indigestion Fluconazole 150 mg tablet daily Flonase  nasal spray  daily Norco/Vicodin 5-325 mg tablet, 1 tablet by mouth as needed for pain Ibuprofen 200 mg, every 6 hours as needed for pain Ketorolac 30 mg/milliliter injection, 1 mL into the muscle twice daily a week Lidocaine  patch Lorazepam  2 mg, 1-2 mg by mouth every 8 hours as needed for anxiety Metolazone  2.5 mg daily Singulair  10 mg daily at bedtime Bactroban topical twice daily Naloxone as needed for opioid reversal Nitroglycerin  0.4 mg sublingual tablet as needed for chest pain Nystatin powder Ohtuvayre  ( Ensifenterine), inhale 1 each in to the lungs BID Omeprazole 20 mg twice daily Ozempic 0.25 mg weekly Pentoxifylline  400 mg tablet BID  Potassium chloride  20 mEq, 2 tablets daily Pseudoephedrine -guaifenesin  [phone] mg, 1 tablet twice daily Flomax  0.4 mg capsules daily Spiriva  inhaler, 1 puff into inhaler inhale daily Torsemide 10 mg, as needed for edema  Active Medications[1]  Past Medical History chronic hypoxemic respiratory failure, asthma-COPD overlap syndrome, obesity, HFpEF, OSA on NIV, chronic sinusitis, GERD, HLD, osteoarthritis of BL knees  Past Surgical History Past Surgical History:  Procedure Laterality Date   BIOPSY  04/26/2023   Procedure: BIOPSY;  Surgeon: Federico Rosario BROCKS, MD;  Location: [facility];  Service: Gastroenterology;;   COLONOSCOPY  last one 2019   COLONOSCOPY WITH PROPOFOL  N/A 04/26/2023   Procedure: COLONOSCOPY WITH PROPOFOL ;  Surgeon: Federico Rosario BROCKS, MD;  Location: [facility];  Service: Gastroenterology;  Laterality: N/A;   ESOPHAGOGASTRODUODENOSCOPY     Said last one done around 2019-2020   ESOPHAGOGASTRODUODENOSCOPY (EGD) WITH PROPOFOL  N/A 04/26/2023   Procedure: ESOPHAGOGASTRODUODENOSCOPY (EGD) WITH PROPOFOL ;  Surgeon: Federico Rosario BROCKS, MD;  Location: [facility];  Service: Gastroenterology;  Laterality: N/A;   KNEE ARTHROSCOPY Right 2002   KNEE ARTHROSCOPY WITH MEDIAL MENISECTOMY Left 01/06/2018   Procedure: LEFT KNEE ARTHROSCOPY WITH MEDIAL  MENISECTOMY;  Surgeon: Fidel Rogue, MD;  Location: [facility];  Service: Orthopedics;  Laterality: Left;   LEFT HEART CATH AND CORONARY ANGIOGRAPHY N/A 11/21/2018   Procedure: LEFT HEART CATH AND CORONARY ANGIOGRAPHY;  Surgeon: Mady Bruckner, MD;  Location: [facility];  Service: Cardiovascular;  Laterality: N/A;   POLYPECTOMY  04/26/2023   Procedure: POLYPECTOMY;  Surgeon: Federico Rosario BROCKS, MD;  Location: [facility];  Service: Gastroenterology;;     Social:  PCP: Street, Bruckner HERO, MD  Tobacco: Quit 2018, smoking since 63 years old  Alcohol: None  Illicit drugs: None  Functionality: Battery powered mobility chair and handicapped house with rails in the bathroom and aorund the house.  Patient able to walk with assistance from railings.  He has a caregiver that works for him 40 hours a week from M to F 7 hours a day, Saturday 5 hours with Sunday off  Family History:  Family History  Problem Relation Age of Onset   Diabetes Mother    Breast cancer Mother    Emphysema Maternal Grandfather    Colon cancer Neg Hx    Esophageal cancer Neg  Hx      Allergies: Allergies as of 05/30/2024   (No Known Allergies)    Review of Systems: A complete ROS was negative except as per HPI.   OBJECTIVE:   Physical Exam: Blood pressure 112/74, pulse 95, temperature 97.9 F (36.6 C), resp. rate 18, height 6' (1.829 m), weight (!) 138.8 kg, SpO2 100%.  Constitutional: Obese male on supplemental O2, in no acute distress when at rest but started getting tired and slightly short of breath as he continued talking.  HENT: normocephalic atraumatic Neck: supple, no JVD noted Cardiovascular: regular rate and rhythm, no m/r/g; +2 RP BL; +2 DP BL Pulmonary/Chest: Expiratory wheeze noted in upper, middle, lower posterior lung fields MSK: No pitting edema noted in BL LE Abdominal: soft, non-tender Skin: warm and dry Psych: Normal affect  Labs: CBC    Component Value  Date/Time   WBC 15.8 (H) 05/30/2024 1237   RBC 5.28 05/30/2024 1237   HGB 15.0 05/30/2024 1242   HGB 15.6 06/23/2023 1059   HCT 44.0 05/30/2024 1242   HCT 47.3 06/23/2023 1059   PLT 312 05/30/2024 1237   PLT 311 06/23/2023 1059   MCV 84.3 05/30/2024 1237   MCV 92 06/23/2023 1059   MCH 26.3 05/30/2024 1237   MCHC 31.2 05/30/2024 1237   RDW 16.2 (H) 05/30/2024 1237   RDW 12.8 06/23/2023 1059   LYMPHSABS 2.6 05/25/2024 1530   LYMPHSABS 2.0 11/17/2018 1023   MONOABS 0.9 05/25/2024 1530   EOSABS 0.5 05/25/2024 1530   EOSABS 0.3 11/17/2018 1023   BASOSABS 0.1 05/25/2024 1530   BASOSABS 0.0 11/17/2018 1023     CMP     Component Value Date/Time   NA 136 05/30/2024 1242   NA 138 03/23/2024 1445   K 3.7 05/30/2024 1242   CL 97 (L) 05/30/2024 1237   CO2 30 05/30/2024 1237   GLUCOSE 142 (H) 05/30/2024 1237   BUN 23 05/30/2024 1237   BUN 13 03/23/2024 1445   CREATININE 1.14 05/30/2024 1237   CALCIUM  10.0 05/30/2024 1237   PROT 6.9 05/29/2024 1319   PROT 7.0 09/17/2023 1120   ALBUMIN 3.3 (L) 05/29/2024 1319   ALBUMIN 4.2 09/17/2023 1120   AST 19 05/29/2024 1319   ALT 18 05/29/2024 1319   ALKPHOS 70 05/29/2024 1319   BILITOT 0.8 05/29/2024 1319   BILITOT 0.4 09/17/2023 1120   GFRNONAA >60 05/30/2024 1237   GFRAA 74 11/30/2019 1049    Imaging:  DG Chest 2 View Result Date: 05/30/2024 EXAM: 2 VIEW(S) XRAY OF THE CHEST 05/30/2024 01:05:00 PM COMPARISON: Comparison with 05/29/2024. CLINICAL HISTORY: Shortness of breath, tachycardia. FINDINGS: LUNGS AND PLEURA: Blunting of the left costophrenic angle probably due to prominent cardiac fat pad. No change since prior study. No airspace disease or consolidation in the lungs. No pleural effusion. No pneumothorax. HEART AND MEDIASTINUM: No acute abnormality of the cardiac and mediastinal silhouettes. BONES AND SOFT TISSUES: Old right rib fractures. Degenerative changes in the spine. Old right clavicular fracture deformity. IMPRESSION: 1.  No acute cardiopulmonary findings. 2. Blunting of the left costophrenic angle, likely due to a prominent cardiac fat pad, unchanged from the prior study. Electronically signed by: Elsie Gravely MD 05/30/2024 01:22 PM EST RP Workstation: [id number]   DG Chest 2 View Result Date: 05/29/2024 CLINICAL DATA:  Shortness of breath. EXAM: CHEST - 2 VIEW COMPARISON:  Chest radiograph dated 05/25/2024. FINDINGS: Small left pleural effusion and left lung base atelectasis. Pneumonia is not excluded. The right lung is clear.  No pneumothorax. The cardiac silhouette. No acute osseous pathology. Old right rib fractures. IMPRESSION: Small left pleural effusion and left lung base atelectasis. Pneumonia is not excluded. Electronically Signed   By: Vanetta Chou M.D.   On: 05/29/2024 14:10     EKG: personally reviewed my interpretation is sinus tachycardia with normal axis unchanged from prior EKG.  ASSESSMENT & PLAN:   Assessment & Plan by Problem: Active Problems:   * No active hospital problems. *   Taylor Vargas is a 63 y.o. person living with a history of chronic hypoxemic respiratory failure, asthma-COPD overlap syndrome, obesity, HFpEF, OSA on NIV, chronic sinusitis who presented with shortness of breath and is admitted for acute hypoxic respiratory failure 0  Acute on chronic hypoxic respiratory failure Asthma-COPD overlap syndrome Patient came in with shortness of breath going on for the past 5 weeks with worsening in the last 2 days.  He is on 3 L of supplemental oxygen  at home.  He was seen by his pulmonologist Dr. Kara on request.  D-dimer and CTA were negative for PE.  He is afebrile with no signs of pneumonia on CXR or CTA.  Elevations in WBC count could be due to chronic prednisone  use.  Unsure the exact cause of his progressive dyspnea this point.  CTA showed mild emphysema.  Could be due to his asthma-COPD or due to an NIV machine failure.  Patient reports he is taking his NIV machine properly  and has brought in with him to demonstrate its use.  Will consult RT for NIV use and to make sure if patient is using it properly at home.  Recent BNP was low.  He does not appear to be volume overloaded on physical exam but will get echo to evaluate for possible pulmonary hypertension, per pulmonology recommendations.  Will continue treating with triple therapy and continue home inhalers.  -Triple therapy twice daily daily - DuoNeb every 4 hours as needed - Singulair  10 mg daily - Prednisone  40 mg daily: Day 1/4 - Echocardiogram ordered - Consulted RT for NIV use to make sure patient is using home NIV machine properly -Incentive spirometry - PT/OT daily  OSA on NIV Obesity Patient uses NIV at the day.  Consulted RT as discussed about.  Need to evaluate if he is using his NIV machine appropriately at home or if his NIV machine is working properly. - RT consulted for NIV -Evaluate NIV machine to make sure it is working appropriately  Documented history of HFpEF Last echo findings from 2024 EF of 55 to 60% with normal diastolic function for patient's age.  However, patient has documented history of HFpEF.  Will repeat echo during this admission.  Patient does not appear to be volume overloaded on the physical exam. - Echocardiogram ordered  Chronic conditions:  Chronic sinusitis Patient reports he has been dealing with chronic sinusitis and postnasal drip for a long time.  Reports he needs to cough in the morning to get rid of sputum. -Fluticasone  daily  GERD -Continue Pepcid  40 mg daily as needed for heartburn, indigestion -Continue pantoprazole  80 mg daily  HLD - Continue Lipitor 40 mg daily  Chronic bilateral knee pain due to osteoarthritis - Norco/Vicodin oral daily every 12 hours as needed      Best practice: Diet: Heart Healthy VTE: Xarelto  IVF: None,None Code: Full  Disposition planning: Prior to Admission Living Arrangement: Home, living   Anticipated Discharge  Location: Home  Dispo: Admit patient to inpatient with expected length of  stay based on clinical improvement  Signed: Edgardo Pontiff, DO Internal Medicine Resident  05/30/2024, 2:53 PM  On Call pager: [phone]      [1]  No outpatient medications have been marked as taking for the 05/30/24 encounter [facility] Encounter).

## 2024-05-30 NOTE — ED Provider Notes (Signed)
 [place] [facility] AT [facility] Provider Note   CSN: [id number] Arrival date & time: 05/30/24  1137     Patient presents with: Shortness of Breath   Taylor Vargas is a 63 y.o. male.   Taylor Vargas 63 year old male with significant past medical history of COPD, congenital absence of kidney, OSA on CPAP, on supplemental oxygen , morbid obesity, pancreatic cancer (remission since 2017) presented with worsening SOB.  Baseline O2 is 3 L at home.  He had 5 weeks of congestion, sore throat, earache, worsening sinus pressure, productive cough.  Seen outpatient by PCP and pulmonology and treated with antibiotics and prednisone .  In total, he completed two 5-day course of prednisone  then an extended course of prednisone .  Last dose of prednisone  05/29/2024.  In total he completed 2? courses of cephalexin and 1 course of Levaquin .  Overall, chest congestion, sore throat, cough, sputum production, and earache improved but SOB has not improved. SOB has gotten worse over the past couple days.  Uses noninvasive vent at home. Machine has been reading 66-74% O2 during the night (3-4 AM).  Patient was evaluated by triage/ED yesterday 05/29/2024, however could not stay for full evaluation.  The history is provided by the patient.       Prior to Admission medications  Medication Sig Start Date End Date Taking? Authorizing Provider  adapalene  (DIFFERIN ) 0.1 % cream Apply topically at bedtime. 04/19/24   [provider]  albuterol  (ACCUNEB ) 0.63 MG/3ML nebulizer solution USE 1 VIAL VIA NEBULIZER EVERY 6 HOURS AS NEEDED FOR WHEEZING OR SHORTNESS OF BREATH 08/08/23   Kara Dorn NOVAK, MD  albuterol  (VENTOLIN  HFA) [phone] Base) MCG/ACT inhaler Inhale 2 puffs into the lungs every 4 (four) hours as needed for wheezing or shortness of breath. 04/24/24   Kara Dorn NOVAK, MD  allopurinol  (ZYLOPRIM ) 100 MG tablet Take 100 mg by mouth daily.    [provider]  ALPRAZolam (XANAX)  0.25 MG tablet Take 0.25 mg by mouth 3 (three) times daily as needed for anxiety.    [provider]  aspirin  EC 81 MG tablet Take 1 tablet (81 mg total) by mouth daily. Swallow whole. 03/23/24   Revankar, Jennifer SAUNDERS, MD  atorvastatin  (LIPITOR) 40 MG tablet Take 1 tablet (40 mg total) by mouth daily. 03/23/24   Revankar, Jennifer SAUNDERS, MD  azelastine  (ASTELIN ) 0.1 % nasal spray Place 1 spray into both nostrils 2 (two) times daily as needed for rhinitis. 05/24/23   Parrett, Madelin RAMAN, NP  budesonide -formoterol  (SYMBICORT ) 160-4.5 MCG/ACT inhaler Inhale 2 puffs into the lungs 2 (two) times daily. 04/24/24   Kara Dorn NOVAK, MD  cyanocobalamin (VITAMIN B12) 1000 MCG/ML injection Inject 1,000 mcg into the muscle every 30 (thirty) days. 05/11/23   [provider]  diclofenac  Sodium (VOLTAREN ) 1 % GEL Apply 1 application topically as needed for pain.    [provider]  Dupilumab  (DUPIXENT ) 300 MG/2ML SOAJ Inject 300 mg into the skin every 14 (fourteen) days. 05/09/24   Kara Dorn NOVAK, MD  Ensifentrine  (OHTUVAYRE ) 3 MG/2.5ML SUSP Inhale 1 each into the lungs 2 (two) times daily. 05/24/23   Parrett, Madelin RAMAN, NP  EPINEPHrine  (EPIPEN  2-PAK) 0.3 mg/0.3 mL IJ SOAJ injection Inject 0.3 mg into the muscle as needed for anaphylaxis. 06/09/22   McQuaid, Douglas B, MD  famotidine  (PEPCID ) 40 MG tablet Take 40 mg by mouth daily as needed for heartburn or indigestion. 04/30/23   [provider]  fluconazole (DIFLUCAN) 150 MG tablet  Take 1 tablet (150 mg total) by mouth daily. 05/25/24   Kara Dorn NOVAK, MD  fluticasone  (FLONASE ) 50 MCG/ACT nasal spray Place 1 spray into both nostrils daily. 04/24/24   Kara Dorn NOVAK, MD  HYDROcodone -acetaminophen  (NORCO/VICODIN) 5-325 MG tablet Take 1 tablet by mouth as needed for pain.    [provider]  ibuprofen (ADVIL) 200 MG tablet Take 200 mg by mouth every 6 (six) hours as needed for pain.    [provider]  ketorolac  (TORADOL) 30 MG/ML injection Inject 1 mL into the muscle 2 (two) times a week. 07/17/22   [provider]  levofloxacin  (LEVAQUIN ) 750 MG tablet Take 1 tablet (750 mg total) by mouth daily. 05/25/24   Kara Dorn NOVAK, MD  lidocaine  (LIDODERM ) 5 % Place 1 patch onto the skin daily. 08/17/22   [provider]  LORazepam  (ATIVAN ) 2 MG tablet Take 1-2 mg by mouth every 8 (eight) hours as needed for anxiety. 06/15/23   [provider]  metolazone  (ZAROXOLYN ) 2.5 MG tablet Take 1 tablet (2.5 mg total) by mouth daily. 03/23/24   Revankar, Jennifer SAUNDERS, MD  montelukast  (SINGULAIR ) 10 MG tablet Take 1 tablet (10 mg total) by mouth at bedtime. 04/24/24   Kara Dorn NOVAK, MD  mupirocin ointment (BACTROBAN) 2 % Apply 1 application topically 2 (two) times daily. 03/21/20   [provider]  Na Sulfate-K Sulfate-Mg Sulf 17.5-3.13-1.6 GM/177ML SOLN Use as directed; may use generic; goodrx card if insurance will not cover generic 03/30/23   Federico Rosario BROCKS, MD  naloxone [facility]) nasal spray 4 mg/0.1 mL Place 1 spray into the nose as needed for opioid reversal.    [provider]  nitroGLYCERIN  (NITROSTAT ) 0.4 MG SL tablet Place 1 tablet (0.4 mg total) under the tongue every 5 (five) minutes as needed for chest pain. 03/23/24   Revankar, Rajan R, MD  NYSTATIN powder Apply 1 application. topically 2 (two) times daily. 06/19/21   [provider]  omeprazole (PRILOSEC) 20 MG capsule Take 20 mg by mouth 2 (two) times daily. 06/19/21   [provider]  pentoxifylline  (TRENTAL ) 400 MG CR tablet Take 400 mg by mouth 2 (two) times daily. 06/24/21   [provider]  potassium chloride  SA (KLOR-CON  M) 20 MEQ tablet Take 2 tablets (40 mEq total) by mouth daily. 03/23/24   Revankar, Jennifer SAUNDERS, MD  predniSONE  (DELTASONE ) 10 MG tablet Take 4 tablets (40 mg total) by mouth daily with breakfast for 3 days, THEN 3 tablets (30 mg total) daily with breakfast for 3 days, THEN 2  tablets (20 mg total) daily with breakfast for 3 days, THEN 1 tablet (10 mg total) daily with breakfast for 3 days. 05/25/24 06/06/24  Kara Dorn NOVAK, MD  Pseudoephedrine -Guaifenesin  [phone] MG TB12 Take 1 tablet by mouth in the morning and at bedtime. 09/30/20   Sood, Vineet, MD  Semaglutide (OZEMPIC, 0.25 OR 0.5 MG/DOSE, [place]) Inject 0.25 mg into the skin once a week.    [provider]  tamsulosin  (FLOMAX ) 0.4 MG CAPS capsule Take 0.4 mg by mouth daily. 12/26/18   [provider]  Tiotropium Bromide  (SPIRIVA  HANDIHALER) 18 MCG CAPS Place 1 puff into inhaler and inhale daily. 04/24/24   Kara Dorn NOVAK, MD  torsemide (DEMADEX) 10 MG tablet Take 5-10 mg by mouth as needed for edema.    [provider]    Allergies: Patient has no known allergies.    Review of Systems  Constitutional:  Negative for  appetite change, chills, fatigue and fever.  HENT:  Positive for postnasal drip. Negative for congestion, ear pain, rhinorrhea and sore throat.   Respiratory:  Positive for shortness of breath. Negative for cough and chest tightness.   Cardiovascular:  Negative for chest pain, palpitations and leg swelling.  Gastrointestinal:  Negative for abdominal pain, constipation, diarrhea, nausea and vomiting.  Genitourinary:  Negative for dysuria.  Musculoskeletal:  Positive for joint swelling (Bilateral knee).  Neurological:  Negative for headaches.    Updated Vital Signs BP 122/83   Pulse 90   Temp 98.1 F (36.7 C) (Oral)   Resp 20   Ht 6' (1.829 m)   Wt (!) 138.8 kg   SpO2 98%   BMI 41.50 kg/m   Physical Exam Constitutional:      General: He is not in acute distress.    Appearance: He is not ill-appearing, toxic-appearing or diaphoretic.  Cardiovascular:     Rate and Rhythm: Normal rate and regular rhythm.     Pulses: Normal pulses. No decreased pulses.     Heart sounds: Normal heart sounds. No murmur heard.    No friction rub. No gallop.  Pulmonary:      Effort: Pulmonary effort is normal. No accessory muscle usage or respiratory distress.     Breath sounds: Examination of the right-upper field reveals wheezing. Examination of the left-upper field reveals wheezing. Examination of the right-middle field reveals wheezing. Examination of the left-middle field reveals wheezing. Examination of the right-lower field reveals wheezing. Examination of the left-lower field reveals wheezing. Wheezing present. No decreased breath sounds, rhonchi or rales.     Comments: Speaking full complete sentences Abdominal:     General: Bowel sounds are normal.     Tenderness: There is no abdominal tenderness.  Musculoskeletal:     Right lower leg: No edema.     Left lower leg: No edema.  Skin:    General: Skin is warm and dry.  Neurological:     Mental Status: He is alert.     (all labs ordered are listed, but only abnormal results are displayed) Labs Reviewed  BASIC METABOLIC PANEL WITH GFR - Abnormal; Notable for the following components:      Result Value   Chloride 97 (*)    Glucose, Bld 142 (*)    All other components within normal limits  CBC - Abnormal; Notable for the following components:   WBC 15.8 (*)    RDW 16.2 (*)    All other components within normal limits  I-STAT VENOUS BLOOD GAS, ED - Abnormal; Notable for the following components:   pH, Ven 7.503 (*)    pCO2, Ven 40.2 (*)    Bicarbonate 31.6 (*)    TCO2 33 (*)    Acid-Base Excess 8.0 (*)    All other components within normal limits  RESPIRATORY PANEL BY PCR  EXPECTORATED SPUTUM ASSESSMENT W GRAM STAIN, RFLX TO RESP C  D-DIMER, QUANTITATIVE  PROCALCITONIN  HIV ANTIBODY (ROUTINE TESTING W REFLEX)  BASIC METABOLIC PANEL WITH GFR  CBC  TROPONIN T, HIGH SENSITIVITY    EKG: EKG Interpretation Date/Time:  Tuesday May 30 2024 12:50:59 EST Ventricular Rate:  108 PR Interval:  168 QRS Duration:  80 QT Interval:  332 QTC Calculation: 444 R Axis:   72  Text  Interpretation: Sinus tachycardia Low voltage QRS Cannot rule out Anterior infarct , age undetermined Abnormal ECG When compared with ECG of 29-May-2024 13:34, PREVIOUS ECG IS PRESENT when compared to prior,  similar appearance No STEMI Confirmed by Ginger Barefoot ([id number]) on 05/30/2024 1:25:52 PM  Radiology: CT Angio Chest PE W and/or Wo Contrast Result Date: 05/30/2024 EXAM: CTA of the Chest with contrast for PE 05/30/2024 03:04:45 PM TECHNIQUE: CTA of the chest was performed without and with the administration of 100 mL of iohexol  (OMNIPAQUE ) 350 MG/ML injection. Multiplanar reformatted images are provided for review. MIP images are provided for review. Automated exposure control, iterative reconstruction, and/or weight based adjustment of the mA/kV was utilized to reduce the radiation dose to as low as reasonably achievable. COMPARISON: CT chest 06/10/2023. CLINICAL HISTORY: Pulmonary embolism (PE) suspected, high prob. FINDINGS: PULMONARY ARTERIES: Pulmonary arteries are adequately opacified for evaluation. No pulmonary embolism. Main pulmonary artery is normal in caliber. MEDIASTINUM: The heart and pericardium demonstrate no acute abnormality. 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 There is a punctate calcification in the left thyroid gland which is unchanged. LYMPH NODES: No mediastinal, hilar or axillary lymphadenopathy. LUNGS AND PLEURA: There are atelectatic changes in the bilateral lower lobes and lingula. There is mild emphysema. No focal consolidation or pulmonary edema. No pleural effusion or pneumothorax. UPPER ABDOMEN: Limited images of the upper abdomen are unremarkable. SOFT TISSUES AND BONES: No acute bone or soft tissue abnormality. IMPRESSION: 1. No pulmonary embolism. 2. 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 Recommend annual imaging follow-up by CTA or MRA. This recommendation follows 2010 ACCF/AHA/AATS/ACR/ASA/SCA/SCAI/SIR/STS/SVM guidelines for the  diagnosis and management of patients with thoracic aortic disease (Circulation 2010;121:E266-E369). Aortic aneurysm NOS (PRI89P28.0). 3. Mild pulmonary emphysema. Pulmonary emphysema is an independent risk factor for lung cancer. Recommend consideration for evaluation for a low-dose CT lung cancer screening program. Electronically signed by: Greig Pique MD 05/30/2024 03:14 PM EST RP Workstation: [id number]   DG Chest 2 View Result Date: 05/30/2024 EXAM: 2 VIEW(S) XRAY OF THE CHEST 05/30/2024 01:05:00 PM COMPARISON: Comparison with 05/29/2024. CLINICAL HISTORY: Shortness of breath, tachycardia. FINDINGS: LUNGS AND PLEURA: Blunting of the left costophrenic angle probably due to prominent cardiac fat pad. No change since prior study. No airspace disease or consolidation in the lungs. No pleural effusion. No pneumothorax. HEART AND MEDIASTINUM: No acute abnormality of the cardiac and mediastinal silhouettes. BONES AND SOFT TISSUES: Old right rib fractures. Degenerative changes in the spine. Old right clavicular fracture deformity. IMPRESSION: 1. No acute cardiopulmonary findings. 2. Blunting of the left costophrenic angle, likely due to a prominent cardiac fat pad, unchanged from the prior study. Electronically signed by: Elsie Gravely MD 05/30/2024 01:22 PM EST RP Workstation: [id number]   DG Chest 2 View Result Date: 05/29/2024 CLINICAL DATA:  Shortness of breath. EXAM: CHEST - 2 VIEW COMPARISON:  Chest radiograph dated 05/25/2024. FINDINGS: Small left pleural effusion and left lung base atelectasis. Pneumonia is not excluded. The right lung is clear. No pneumothorax. The cardiac silhouette. No acute osseous pathology. Old right rib fractures. IMPRESSION: Small left pleural effusion and left lung base atelectasis. Pneumonia is not excluded. Electronically Signed   By: Vanetta Chou M.D.   On: 05/29/2024 14:10     Procedures   Medications Ordered in the ED  arformoterol  (BROVANA ) nebulizer  solution 15 mcg (has no administration in time range)  revefenacin  (YUPELRI ) nebulizer solution 175 mcg (175 mcg Nebulization Given 05/30/24 1552)  rivaroxaban  (XARELTO ) tablet 10 mg (10 mg Oral Given 05/30/24 1628)  acetaminophen  (TYLENOL ) tablet 650 mg (has no administration in time range)    Or  acetaminophen  (TYLENOL ) suppository 650 mg (has no administration in time range)  senna (SENOKOT) tablet 8.6 mg (has no administration in time range)  polyethylene glycol (MIRALAX  / GLYCOLAX ) packet 17 g (has no administration in time range)  ipratropium-albuterol  (DUONEB) 0.5-2.5 (3) MG/3ML nebulizer solution 3 mL (has no administration in time range)  predniSONE  (DELTASONE ) tablet 40 mg (has no administration in time range)  budesonide  (PULMICORT ) nebulizer solution 0.25 mg (has no administration in time range)  adapalene  (DIFFERIN ) 0.1 % cream (has no administration in time range)  allopurinol  (ZYLOPRIM ) tablet 100 mg (has no administration in time range)  aspirin  EC tablet 81 mg (has no administration in time range)  atorvastatin  (LIPITOR) tablet 40 mg (has no administration in time range)  diclofenac  Sodium (VOLTAREN ) 1 % topical gel 2 g (has no administration in time range)  Ensifentrine  SUSP 1 each (has no administration in time range)  famotidine  (PEPCID ) tablet 40 mg (has no administration in time range)  fluticasone  (FLONASE ) 50 MCG/ACT nasal spray 1 spray (has no administration in time range)  HYDROcodone -acetaminophen  (NORCO/VICODIN) 5-325 MG per tablet 1 tablet (has no administration in time range)  metolazone  (ZAROXOLYN ) tablet 2.5 mg (has no administration in time range)  montelukast  (SINGULAIR ) tablet 10 mg (has no administration in time range)  pantoprazole  (PROTONIX ) EC tablet 80 mg (has no administration in time range)  pentoxifylline  (TRENTAL ) CR tablet 400 mg (has no administration in time range)  tamsulosin  (FLOMAX ) capsule 0.4 mg (has no administration in time range)   LORazepam  (ATIVAN ) tablet 0.5-1 mg (has no administration in time range)  ipratropium-albuterol  (DUONEB) 0.5-2.5 (3) MG/3ML nebulizer solution 3 mL (3 mLs Nebulization Given 05/30/24 1552)  methylPREDNISolone  sodium succinate (SOLU-MEDROL ) 40 mg/mL injection 40 mg (40 mg Intravenous Given 05/30/24 1554)  magnesium  sulfate IVPB 2 g 50 mL (0 g Intravenous Stopped 05/30/24 1657)  iohexol  (OMNIPAQUE ) 350 MG/ML injection 100 mL (100 mLs Intravenous Contrast Given 05/30/24 1506)                                    Medical Decision Making This patient is a 63 y.o. male who presents to the ED for concern of acute hypoxic respiratory failure, this involves an extensive number of treatment options, and is a complaint that carries with it a high risk of complications and morbidity. The emergent differential diagnosis prior to evaluation includes, but is not limited to, PE, COPD exacerbation, CAP. This is not an exhaustive differential.   Past Medical History / Co-morbidities / Social History: - COPD - congenital absence of kidney - OSA on CPAP - Supplemental oxygen  - morbid obesity - pancreatic cancer (remission since 2017)  Physical Exam: Physical exam performed. The pertinent findings include: Wheezing in bilateral posterior lung fields.  Cardiac exam benign.  Lab Tests: I ordered, and personally interpreted labs.  The pertinent results include:    - Vital signs: Afebrile, tachycardia, satting 100% on 8 L [place] - BMP: Creatinine 1.14, chloride 97 - CBC: WBC 15.8 (likely 2/2 steroid use) - D-dimer: Less than 0.27 - Troponin: 19 - Procalcitonin: <0.1 - Respiratory viral panel 12/15: Negative - I-STAT VBG: pH 7.5, pCO2 40.2, bicarb 31.6, PaO2 44   Imaging Studies and EKG: I ordered imaging studies including EKG, echo, CTA, DG chest. I independently visualized and interpreted imaging and I agree with the radiologist interpretation:  - EKG: Sinus tachycardia (108 bpm) - Echo: Pending - CTA: No  pulmonary embolism.  Mild pulmonary emphysema - DG chest: No acute  pulmonary findings.  Unchanged from previous.  No evidence of pneumonia    Medications: I ordered medication including DuoNeb, Solu-Medrol , magnesium  for acute hypoxic respiratory failure.  Consultations Obtained: I requested consultation with the IMTS,  and discussed lab and imaging findings as well as pertinent plan - they recommend: Admission Patient's pulmonologist was bedside during initial evaluation and recommended: Admission, CTA, echo   MDM: - Patient presented with acute hypoxic respiratory failure requiring increased oxygen  per [place] from 3 L baseline. - Physical exam demonstrated bilateral expiratory wheeze in all posterior lung fields, respiratory viral panel was negative, patient is afebrile, no increase in cough or sputum production.  WBC likely 2/2 recent steroid use.  CTA negative for PE.  Echo 03/2023 EF 55 to 60% due to increased oxygen  requirements, admission warranted to ensure that oxygen  requirements go down after appropriate breathing treatment. -Patient's pulmonologist stated that he will follow peripherally -Patient would also benefit from evaluation of noninvasive home vent to ensure that readings are correct at home.  I discussed this case with my attending physician Dr. Ginger who cosigned this note including patient's presenting symptoms, physical exam, and planned diagnostics and interventions. Attending physician stated agreement with plan or made changes to plan which were implemented.      Amount and/or Complexity of Data Reviewed Radiology: ordered.  Risk Prescription drug management. Decision regarding hospitalization.       Final diagnoses:  None    ED Discharge Orders     None          Benuel Braun, DO 05/30/24 1723    Tegeler, Lonni PARAS, MD 05/31/24 [phone]

## 2024-05-30 NOTE — ED Triage Notes (Signed)
 C/O [facility] for 5 weeks.. C/O low O2. Denies CP. Axox4. Pt is on 5L of O2.

## 2024-05-30 NOTE — ED Notes (Signed)
 Pt irritable w/ hospital visit stating, My dog gets better care from the vet. Pt expressing disgust with not having a call light and not having a TV remote. Tried to explain that he is in a holding area and that I can turn the TV on for him. Pt asked for the lights to be turned out and the door closed. Will continue to monitor pt.

## 2024-05-30 NOTE — Telephone Encounter (Signed)
 Copied from CRM #[id number]. Topic: General - Other >> May 29, 2024  8:15 AM Taylor Vargas wrote: Reason for CRM: pt's brother Adriana called in stating Dr. Kara advised pt to check in to the hospital this week for Vargas couple of tests since he would be rounding there this week, pt is planning on checking in at [facility] around 10:30-12:30pm - [phone]

## 2024-05-30 NOTE — Consult Note (Signed)
 NAME:  Taylor Vargas, MRN:  [id number], DOB:  1961-03-16, LOS: 0 ADMISSION DATE:  05/30/2024, CONSULTATION DATE:  05/30/24 REFERRING MD:  EDP CHIEF COMPLAINT:  Shortness of breath   History of Present Illness:  Taylor Vargas is a 63 year old male, former smoker with obesity, HFpEF, OSA on NIV, chronic sinusitis, chronic hypoxemic respiratory failure and asthma-COPD overlap syndrome who presents to the ED with on going dyspnea after being seen 05/25/24 in pulmonary clinic. He was started on extended steroid taper and levaquin  5 days ago. He denies feeling any better from a respiratory standpoint. He has been using his NIV around the clock and taking it off for meals. He reports low oxygen  levels down to the 60s and 70s this morning.   Initial ER vitals HR 127, BP 164/144, 96% on 3L O2.   VBG 7.5, pCO2 40. Na 138, K 3.9, Cl 97, Cr 1.14, Bicarb 30. Ddimer negative. BNP from yesterday 27. Procal negative.  Chest radiograph with small left pleural effusion vs chronic cardiac fat pad.   Pertinent  Medical History  Asthma-COPD Overlap Syndrome Chronic Hypoxemic Respiratory Failure Pancreatic Cancer s/p Chemotherapy Chronic Sinusitis Obesity   Significant Hospital Events: Including procedures, antibiotic start and stop dates in addition to other pertinent events     Interim History / Subjective:  As above  Objective    Blood pressure (!) 164/144, pulse (!) 126, temperature 97.9 F (36.6 C), resp. rate 18, height 6' (1.829 m), weight (!) 138.8 kg, SpO2 96%.       No intake or output data in the 24 hours ending 05/30/24 1405 Filed Weights   05/30/24 1206  Weight: (!) 138.8 kg    Examination: General: obese male, no distress, sitting up in bed HENT: [place]/AT, moist mucous membranes Lungs: bilateral upper lung field wheezing posteriorly Cardiovascular: tachycardic, no murmurs Abdomen: soft, non-tender, non-distended Extremities: warm, trace to 1 + edema Neuro: alert, moving all  extremities GU: n/a  Resolved problem list   Assessment and Plan   Acute on Chronic Hypoxemic Respiratory Failure Asthma-COPD Overlap with exacerbation Hx of OSA on NIV - Unsure of exact cause of his progressive dyspnea. Differential includes exacerbation of Asthma-COPD, vs PE vs NIV machine failure vs anxiety. - D-dimer negative, reassuring that thromboembolic disease is not involved - Check Echo to evaluate for possible pulmonary hypertension, although BNP low yesterday which is reassuring - Check CT Chest without-contrast to evaluate for possible left pleural effusion vs cardiac fat pad and to evaluate the lung parenchyma in detail - consult RT for NIV needs at bedtime for hx of OSA - Will need to evaluate his NIV machine to ensure it is working appropriately - Will treat with IV solumedrol, duoneb and 2g magnesium   - Check extended resp viral panel - Resume budesonide , brovana  and yupelri  nebulizer treatments - Recommend admit to hospitalist service - Obtain PT evaluation  Labs   CBC: Recent Labs  Lab 05/25/24 1530 05/29/24 1319 05/30/24 1237 05/30/24 1242  WBC 9.8 15.6* 15.8*  --   NEUTROABS 5.8  --   --   --   HGB 13.6 13.9 13.9 15.0  HCT 41.3 45.3 44.5 44.0  MCV 82.3 84.2 84.3  --   PLT 300.0 344 312  --     Basic Metabolic Panel: Recent Labs  Lab 05/25/24 1530 05/29/24 1319 05/30/24 1242  NA 141 139 136  K 3.5 4.2 3.7  CL 97 99  --   CO2 37* 31  --  GLUCOSE 156* 150*  --   BUN 12 21  --   CREATININE 1.15 1.33*  --   CALCIUM  9.2 9.1  --    GFR: Estimated Creatinine Clearance: 80 mL/min (A) (by C-G formula based on SCr of 1.33 mg/dL (H)). Recent Labs  Lab 05/25/24 1530 05/29/24 1319 05/30/24 1237  WBC 9.8 15.6* 15.8*    Liver Function Tests: Recent Labs  Lab 05/25/24 1530 05/29/24 1319  AST 16 19  ALT 20 18  ALKPHOS 77 70  BILITOT 0.4 0.8  PROT 6.7 6.9  ALBUMIN 3.6 3.3*   No results for input(s): LIPASE, AMYLASE in the last 168  hours. No results for input(s): AMMONIA in the last 168 hours.  ABG    Component Value Date/Time   PHART 7.405 11/19/2020 1057   PCO2ART 47.2 11/19/2020 1057   PO2ART 76.1 (L) 11/19/2020 1057   HCO3 31.6 (H) 05/30/2024 1242   TCO2 33 (H) 05/30/2024 1242   O2SAT 84 05/30/2024 1242     Coagulation Profile: No results for input(s): INR, PROTIME in the last 168 hours.  Cardiac Enzymes: No results for input(s): CKTOTAL, CKMB, CKMBINDEX, TROPONINI in the last 168 hours.  HbA1C: No results found for: HGBA1C  CBG: No results for input(s): GLUCAP in the last 168 hours.  Review of Systems:   Review of Systems  Constitutional:  Positive for malaise/fatigue. Negative for chills, fever and weight loss.  HENT:  Negative for congestion, sinus pain and sore throat.   Eyes: Negative.   Respiratory:  Positive for shortness of breath and wheezing. Negative for cough, hemoptysis and sputum production.   Cardiovascular:  Negative for chest pain, palpitations, orthopnea, claudication and leg swelling.  Gastrointestinal:  Negative for abdominal pain, heartburn, nausea and vomiting.  Genitourinary: Negative.   Musculoskeletal:  Negative for joint pain and myalgias.  Skin:  Negative for rash.  Neurological:  Negative for weakness.  Endo/Heme/Allergies: Negative.   Psychiatric/Behavioral: Negative.       Past Medical History:  He,  has a past medical history of Abnormal glucose (08/27/2015), Arthritis, Benign prostatic hyperplasia with lower urinary tract symptoms (08/27/2015), Chest tightness (11/08/2018), Chronic diastolic heart failure (HCC) (88/93/7979), Chronic obstructive pulmonary disease (HCC) (08/27/2015), Chronic pain of both knees (01/04/2020), Congenital absence of one kidney, COPD (chronic obstructive pulmonary disease) (HCC), Coronary artery calcification seen on CAT scan (11/30/2019), GERD (gastroesophageal reflux disease), History of colon polyps (04/26/2023),  Hypercholesterolemia (09/10/2015), Hyperglycemia (09/10/2015), Malaise and fatigue (08/27/2015), Morbid obesity (HCC) (11/02/2018), On supplemental oxygen  therapy, OSA on CPAP, Osteoarthritis of right knee (01/11/2020), Pain in left knee (12/28/2017), Pancreatic cancer (HCC), and Solitary thyroid nodule (09/10/2015).   Surgical History:   Past Surgical History:  Procedure Laterality Date   BIOPSY  04/26/2023   Procedure: BIOPSY;  Surgeon: Federico Rosario BROCKS, MD;  Location: [facility];  Service: Gastroenterology;;   COLONOSCOPY  last one 2019   COLONOSCOPY WITH PROPOFOL  N/A 04/26/2023   Procedure: COLONOSCOPY WITH PROPOFOL ;  Surgeon: Federico Rosario BROCKS, MD;  Location: [facility];  Service: Gastroenterology;  Laterality: N/A;   ESOPHAGOGASTRODUODENOSCOPY     Said last one done around 2019-2020   ESOPHAGOGASTRODUODENOSCOPY (EGD) WITH PROPOFOL  N/A 04/26/2023   Procedure: ESOPHAGOGASTRODUODENOSCOPY (EGD) WITH PROPOFOL ;  Surgeon: Federico Rosario BROCKS, MD;  Location: [facility];  Service: Gastroenterology;  Laterality: N/A;   KNEE ARTHROSCOPY Right 2002   KNEE ARTHROSCOPY WITH MEDIAL MENISECTOMY Left 01/06/2018   Procedure: LEFT KNEE ARTHROSCOPY WITH MEDIAL MENISECTOMY;  Surgeon: Fidel Rogue, MD;  Location: [facility];  Service: Orthopedics;  Laterality: Left;   LEFT HEART CATH AND CORONARY ANGIOGRAPHY N/A 11/21/2018   Procedure: LEFT HEART CATH AND CORONARY ANGIOGRAPHY;  Surgeon: Mady Bruckner, MD;  Location: [facility];  Service: Cardiovascular;  Laterality: N/A;   POLYPECTOMY  04/26/2023   Procedure: POLYPECTOMY;  Surgeon: Federico Rosario BROCKS, MD;  Location: [facility];  Service: Gastroenterology;;     Social History:   reports that he quit smoking about 7 years ago. His smoking use included cigarettes. He started smoking about 38 years ago. He has a 62 pack-year smoking history. He has never used smokeless tobacco. He reports that he does not currently use alcohol. He  reports that he does not use drugs.   Family History:  His family history includes Breast cancer in his mother; Diabetes in his mother; Emphysema in his maternal grandfather. There is no history of Colon cancer or Esophageal cancer.   Allergies Allergies[1]   Home Medications  Prior to Admission medications  Medication Sig Start Date End Date Taking? Authorizing Provider  adapalene  (DIFFERIN ) 0.1 % cream Apply topically at bedtime. 04/19/24   [provider]  albuterol  (ACCUNEB ) 0.63 MG/3ML nebulizer solution USE 1 VIAL VIA NEBULIZER EVERY 6 HOURS AS NEEDED FOR WHEEZING OR SHORTNESS OF BREATH 08/08/23   Kara Dorn NOVAK, MD  albuterol  (VENTOLIN  HFA) 108 (90 Base) MCG/ACT inhaler Inhale 2 puffs into the lungs every 4 (four) hours as needed for wheezing or shortness of breath. 04/24/24   Kara Dorn NOVAK, MD  allopurinol  (ZYLOPRIM ) 100 MG tablet Take 100 mg by mouth daily.    [provider]  ALPRAZolam (XANAX) 0.25 MG tablet Take 0.25 mg by mouth 3 (three) times daily as needed for anxiety.    [provider]  aspirin  EC 81 MG tablet Take 1 tablet (81 mg total) by mouth daily. Swallow whole. 03/23/24   Revankar, Jennifer SAUNDERS, MD  atorvastatin  (LIPITOR) 40 MG tablet Take 1 tablet (40 mg total) by mouth daily. 03/23/24   Revankar, Jennifer SAUNDERS, MD  azelastine  (ASTELIN ) 0.1 % nasal spray Place 1 spray into both nostrils 2 (two) times daily as needed for rhinitis. 05/24/23   Parrett, Madelin RAMAN, NP  budesonide -formoterol  (SYMBICORT ) 160-4.5 MCG/ACT inhaler Inhale 2 puffs into the lungs 2 (two) times daily. 04/24/24   Kara Dorn NOVAK, MD  cyanocobalamin (VITAMIN B12) 1000 MCG/ML injection Inject 1,000 mcg into the muscle every 30 (thirty) days. 05/11/23   [provider]  diclofenac  Sodium (VOLTAREN ) 1 % GEL Apply 1 application topically as needed for pain.    [provider]  Dupilumab  (DUPIXENT ) 300 MG/2ML SOAJ Inject 300 mg into the skin every 14 (fourteen) days.  05/09/24   Kara Dorn NOVAK, MD  Ensifentrine  (OHTUVAYRE ) 3 MG/2.5ML SUSP Inhale 1 each into the lungs 2 (two) times daily. 05/24/23   Parrett, Madelin RAMAN, NP  EPINEPHrine  (EPIPEN  2-PAK) 0.3 mg/0.3 mL IJ SOAJ injection Inject 0.3 mg into the muscle as needed for anaphylaxis. 06/09/22   Alaine Vicenta NOVAK, MD  famotidine  (PEPCID ) 40 MG tablet Take 40 mg by mouth daily as needed for heartburn or indigestion. 04/30/23   [provider]  fluconazole (DIFLUCAN) 150 MG tablet Take 1 tablet (150 mg total) by mouth daily. 05/25/24   Kara Dorn NOVAK, MD  fluticasone  (FLONASE ) 50 MCG/ACT nasal spray Place 1 spray into both nostrils daily. 04/24/24   Kara Dorn NOVAK, MD  HYDROcodone -acetaminophen  (NORCO/VICODIN) 5-325 MG tablet Take 1 tablet by mouth as needed  for pain.    [provider]  ibuprofen (ADVIL) 200 MG tablet Take 200 mg by mouth every 6 (six) hours as needed for pain.    [provider]  ketorolac (TORADOL) 30 MG/ML injection Inject 1 mL into the muscle 2 (two) times a week. 07/17/22   [provider]  levofloxacin  (LEVAQUIN ) 750 MG tablet Take 1 tablet (750 mg total) by mouth daily. 05/25/24   Kara Dorn NOVAK, MD  lidocaine  (LIDODERM ) 5 % Place 1 patch onto the skin daily. 08/17/22   [provider]  LORazepam  (ATIVAN ) 2 MG tablet Take 1-2 mg by mouth every 8 (eight) hours as needed for anxiety. 06/15/23   [provider]  metolazone  (ZAROXOLYN ) 2.5 MG tablet Take 1 tablet (2.5 mg total) by mouth daily. 03/23/24   Revankar, Jennifer SAUNDERS, MD  montelukast  (SINGULAIR ) 10 MG tablet Take 1 tablet (10 mg total) by mouth at bedtime. 04/24/24   Kara Dorn NOVAK, MD  mupirocin ointment (BACTROBAN) 2 % Apply 1 application topically 2 (two) times daily. 03/21/20   [provider]  Na Sulfate-K Sulfate-Mg Sulf 17.5-3.13-1.6 GM/177ML SOLN Use as directed; may use generic; goodrx card if insurance will not cover generic 03/30/23   Federico Rosario BROCKS, MD   naloxone [facility]) nasal spray 4 mg/0.1 mL Place 1 spray into the nose as needed for opioid reversal.    [provider]  nitroGLYCERIN  (NITROSTAT ) 0.4 MG SL tablet Place 1 tablet (0.4 mg total) under the tongue every 5 (five) minutes as needed for chest pain. 03/23/24   Revankar, Rajan R, MD  NYSTATIN powder Apply 1 application. topically 2 (two) times daily. 06/19/21   [provider]  omeprazole (PRILOSEC) 20 MG capsule Take 20 mg by mouth 2 (two) times daily. 06/19/21   [provider]  pentoxifylline  (TRENTAL ) 400 MG CR tablet Take 400 mg by mouth 2 (two) times daily. 06/24/21   [provider]  potassium chloride  SA (KLOR-CON  M) 20 MEQ tablet Take 2 tablets (40 mEq total) by mouth daily. 03/23/24   Revankar, Jennifer SAUNDERS, MD  predniSONE  (DELTASONE ) 10 MG tablet Take 4 tablets (40 mg total) by mouth daily with breakfast for 3 days, THEN 3 tablets (30 mg total) daily with breakfast for 3 days, THEN 2 tablets (20 mg total) daily with breakfast for 3 days, THEN 1 tablet (10 mg total) daily with breakfast for 3 days. 05/25/24 06/06/24  Kara Dorn NOVAK, MD  Pseudoephedrine -Guaifenesin  [phone] MG TB12 Take 1 tablet by mouth in the morning and at bedtime. 09/30/20   Sood, Vineet, MD  Semaglutide (OZEMPIC, 0.25 OR 0.5 MG/DOSE, [place]) Inject 0.25 mg into the skin once a week.    [provider]  tamsulosin  (FLOMAX ) 0.4 MG CAPS capsule Take 0.4 mg by mouth daily. 12/26/18   [provider]  Tiotropium Bromide  (SPIRIVA  HANDIHALER) 18 MCG CAPS Place 1 puff into inhaler and inhale daily. 04/24/24   Kara Dorn NOVAK, MD  torsemide (DEMADEX) 10 MG tablet Take 5-10 mg by mouth as needed for edema.    [provider]     Critical care time: n/a    Dorn Kara, MD [facility] Office: [phone]   See Amion for personal pager PCCM on call pager [phone] until 7pm. Please call Elink 7p-7a. [phone]            [1] No Known Allergies

## 2024-05-30 NOTE — Telephone Encounter (Signed)
 Patient came to ER yesterday but left before being seen by doc.  JD

## 2024-05-30 NOTE — ED Provider Triage Note (Addendum)
 Emergency Medicine Provider Triage Evaluation Note  Taylor Vargas , a 63 y.o. male  was evaluated in triage.  Pt complains of shortness of breath.  Patient was also seen in the emergency department yesterday but did not wait to be seen by provider as the wait times were long.  He notes that he is on 3 L [place] at baseline and around 3 AM he was found to have a pulse oximetry of 66%.  He is a patient of Dr. Kara and requests that we contact him to let him know that he is in the ED. denies chest pain.  Review of Systems  Positive: As above Negative: As above  Physical Exam  BP (!) 164/144   Pulse (!) 126   Temp 97.9 F (36.6 C)   Resp 18   Ht 6' (1.829 m)   Wt (!) 138.8 kg   SpO2 96%   BMI 41.50 kg/m  Gen:   Awake, no distress   Resp:  Normal effort  MSK:   Moves extremities without difficulty  Other:    Medical Decision Making  Medically screening exam initiated at 12:33 PM.  Appropriate orders placed.  Taylor Vargas was informed that the remainder of the evaluation will be completed by another provider, this initial triage assessment does not replace that evaluation, and the importance of remaining in the ED until their evaluation is complete.  Messaged Dr. Kara via secure chat who states Thank you, I think he has worsening hypercapnic respiratory failure he has been dependent on his NIV the past week or so, I think work up to rule out DVT/PE is worth while, looks like he is developing small left pleural effusion, he was treated with cephelexin as an outpatient, I put him on levaquin , If he can get to a room to be evaluated I will be down to see him as well       Taylor Rocky SAILOR, PA-C 05/30/24 1241

## 2024-05-30 NOTE — ED Notes (Signed)
 Pt took off his oxygen  and ambulated to bathroom without calling out for assistance and took his luggage into the bathroom with him. Pt ambulated well. Pt dyspneic w/ exertion.

## 2024-05-30 NOTE — Hospital Course (Addendum)
 D-dimer negative, reassuring that thromboembolic disease is not involved - Check Echo to evaluate for possible pulmonary hypertension, although BNP low yesterday which is reassuring - Check CT Chest without-contrast to evaluate for possible left pleural effusion vs cardiac fat pad and to evaluate the lung parenchyma in detail - consult RT for NIV needs at bedtime for hx of OSA - Will need to evaluate his NIV machine to ensure it is working appropriately - Will treat with IV solumedrol, duoneb and 2g magnesium   - Check extended resp viral panel - Resume budesonide , brovana  and yupelri  nebulizer treatments  ED: AHRF; 2 L baseline, struggling to breath recently, satiing at 61; seen in ED yesterday and left; resp panel, pro calc; 2 courses by PCP and one course from pulonologist althoiugh unsure for how long; ceplex, lev aquin; WBC can be due to prednisone ;   ==============================================  This morning, he has been using the NIV, alarms has been going off, reports that it is uncommon for him to stop breathing 3 AM, he woke up with alarms going off, felt light headed, and noted SpO2 66% Spent all day yesterday trying to get himself checked, reports that he woke up on Monday morning with SpO2 with 70%  Dyspnea ongoing with over five weeks, has had lows in high 70s, able to function, but never had this low. Concerned about fall with hypoxia and dizziness.  No chest pain, no syncopal episode, no LOC.  Unable to lay flat d/t to sinus drainage, has been sleeping on 3 pillows which is chronic for him. No LE edema bilaterally.   Reports baseline 3L, but wears NIV on a daily basis, reports that it is not a CPAP/   Reports that he feels fatigued and hungry.   Never had episode where his O2 was in 60s. No fevers, very religious with checking temperature daily.   Productive cough with clear > nasal mucous. Post nasal drip chronic  - Abd pain, nausea, vomiting. LBM morning. No new pain  in joints   No sick contacts. No recent travels. No hemoptysis. No melena.    Reports that he has mobility chair and rails around the house He has care giver 40 hours a week    Medications: - Started Levoquin 750 mg daily on 12/11  - Cefdinir 300 BID 12/11 > given by PCP > did not take it because Dewald prescribed levaquin  - Prednisone  20 mg 12/10 and prednisone  10 mg 12/11 > has been self tapering. Reports that last week Thursday he was taking 40 mg   11/18 > Abx and prednisone  > completed  11/27-11/28 > starting feel bad again, went to the PCP, was evaluted.  Was not feeling well, called PCP >    Social  Tobacco: Quit 2018, smoking since 63 years old  Alcohol: None  Illicit: None  Functionality: Battery powered mobility chair and handicapped house with rails in the bathroom and aorund the house 40 hours a week a caregiver M-F for 7 hours a day, saturday 5hours, Sunday is off   Medications: Patient and Care giver > arranges every 30 days of medication in the case  Adapalene  0.1% cream Albuterol  0.63 mg/3 mL nebulizer solution every 6 hours as needed for wheezing and shortness of breath Albuterol  108 mcg/ACT inhaler 2 puffs into lungs every 4 hours as needed for wheezing and shortness of breath Allopurinol  100 mg daily Alprazolam 0.25 mg, 3 times daily as needed for anxiety Aspirin  EC 81 mg daily Atorvastatin  40 mg daily Azelastine   0.1% nasal spray as needed for rhinitis Budesonide -formoterol  inhaler (Symbicort ), inhale 2 puffs twice daily Vitamin B12 1000 mcg injection every 30 days Voltaren  gel as needed for pain Dupixent  300 mg / 2 mL injection into the skin every 14 days Epinephrine  injection as needed for anaphylaxis Famotidine  40 mg as needed for heartburn/indigestion Fluconazole 150 mg tablet daily Flonase  nasal spray daily Norco/Vicodin 5-325 mg tablet, 1 tablet by mouth as needed for pain Ibuprofen 200 mg, every 6 hours as needed for pain Ketorolac 30  mg/milliliter injection, 1 mL into the muscle twice daily a week Lidocaine  patch Lorazepam  2 mg, 1-2 mg by mouth every 8 hours as needed for anxiety Metolazone  2.5 mg daily Singulair  10 mg daily at bedtime Bactroban topical twice daily Naloxone as needed for opioid reversal Nitroglycerin  0.4 mg sublingual tablet as needed for chest pain Nystatin powder Ohtuvayre  ( Ensifenterine), inhale 1 each in to the lungs BID Omeprazole 20 mg twice daily Ozempic 0.25 mg weekly Pentoxifylline  400 mg tablet BID  Potassium chloride  20 mEq, 2 tablets daily Pseudoephedrine -guaifenesin  [phone] mg, 1 tablet twice daily Flomax  0.4 mg capsules daily Spiriva  inhaler, 1 puff into inhaler inhale daily Torsemide 10 mg, as needed for edema    Physical exam:  Able to speak in full sentennces    Code status: Full Code.   Has been new neubulizer > using BID

## 2024-05-30 NOTE — Progress Notes (Signed)
°  The team received a secure message from Nurse Damien Fireman reporting that Mr. Taylor Vargas was planning to leave against medical advice. The night team promptly assessed the patient in the ED. Mr. Taylor Vargas was seated in a wheelchair in the intake area, using his home oxygen  at 5L, with no apparent distress but exhibiting mild labored breathing. When I inquired about his reasons for leaving, he expressed frustration with the long wait for a room. I voiced concerns regarding his safety and recommended that he remain until his condition was more stable prior to discharge. Mr. Taylor Vargas was firm in his decision to leave, stating that his brother (a programmer, multimedia) would be arriving within the hour to pick him up. I attempted to assess his oxygen  saturation, but he declined. The patient was oriented to self, time, place, and person, and there were no apparent issues with his decision-making capacity that would warrant the issuance of an involuntary commitment order at this time. Patient understand the risk of leaving AMA including death . Mr. Taylor Vargas was advised that he could return to the ED at any time if needed. He expressed appreciation for our follow-up and for us  checking in on him.  Nursing reported he refused to sign AMA papers.  Drue Lisa Grow MD Internal Medicine Resident: [id number] Please contact the on call pager at: [phone] 05/30/2024, 8:21 PM

## 2024-05-31 NOTE — Discharge Summary (Signed)
 Name: Taylor Vargas MRN: [id number] DOB: Feb 24, 1961 63 y.o. PCP: Street, Taylor HERO, MD  Date of Admission: 05/30/2024 11:48 AM Date of Discharge: 05/30/2024 Attending Physician: No att. providers found   Patient was discharged Against Medical Advice  Discharge Diagnosis: 1.  Acute on chronic hypoxic respiratory failure Discharge Medications: Allergies as of 05/30/2024   No Known Allergies      Medication List     ASK your doctor about these medications    adapalene  0.1 % cream Commonly known as: DIFFERIN  Apply topically at bedtime.   albuterol  0.63 MG/3ML nebulizer solution Commonly known as: ACCUNEB  USE 1 VIAL VIA NEBULIZER EVERY 6 HOURS AS NEEDED FOR WHEEZING OR SHORTNESS OF BREATH   albuterol  108 (90 Base) MCG/ACT inhaler Commonly known as: Ventolin  HFA Inhale 2 puffs into the lungs every 4 (four) hours as needed for wheezing or shortness of breath.   allopurinol  100 MG tablet Commonly known as: ZYLOPRIM  Take 100 mg by mouth daily.   ALPRAZolam 0.25 MG tablet Commonly known as: XANAX Take 0.25 mg by mouth 3 (three) times daily as needed for anxiety.   aspirin  EC 81 MG tablet Take 1 tablet (81 mg total) by mouth daily. Swallow whole.   atorvastatin  40 MG tablet Commonly known as: LIPITOR Take 1 tablet (40 mg total) by mouth daily.   azelastine  0.1 % nasal spray Commonly known as: ASTELIN  Place 1 spray into both nostrils 2 (two) times daily as needed for rhinitis.   budesonide -formoterol  160-4.5 MCG/ACT inhaler Commonly known as: Symbicort  Inhale 2 puffs into the lungs 2 (two) times daily.   cyanocobalamin 1000 MCG/ML injection Commonly known as: VITAMIN B12 Inject 1,000 mcg into the muscle every 30 (thirty) days.   diclofenac  Sodium 1 % Gel Commonly known as: VOLTAREN  Apply 1 application topically as needed for pain.   Dupixent  300 MG/2ML Soaj Generic drug: Dupilumab  Inject 300 mg into the skin every 14 (fourteen) days.   EPINEPHrine  0.3  mg/0.3 mL Soaj injection Commonly known as: EpiPen  2-Pak Inject 0.3 mg into the muscle as needed for anaphylaxis.   famotidine  40 MG tablet Commonly known as: PEPCID  Take 40 mg by mouth daily as needed for heartburn or indigestion.   fluconazole 150 MG tablet Commonly known as: Diflucan Take 1 tablet (150 mg total) by mouth daily.   fluticasone  50 MCG/ACT nasal spray Commonly known as: FLONASE  Place 1 spray into both nostrils daily.   HYDROcodone -acetaminophen  5-325 MG tablet Commonly known as: NORCO/VICODIN Take 1 tablet by mouth as needed for pain.   ibuprofen 200 MG tablet Commonly known as: ADVIL Take 200 mg by mouth every 6 (six) hours as needed for pain.   ketorolac 30 MG/ML injection Commonly known as: TORADOL Inject 1 mL into the muscle See admin instructions. Use 2 times weekly as needed for arthritis pain   levofloxacin  750 MG tablet Commonly known as: LEVAQUIN  Take 1 tablet (750 mg total) by mouth daily.   lidocaine  5 % Commonly known as: LIDODERM  Place 1 patch onto the skin daily.   LORazepam  2 MG tablet Commonly known as: ATIVAN  Take 1-2 mg by mouth every 8 (eight) hours as needed for anxiety.   metolazone  2.5 MG tablet Commonly known as: ZAROXOLYN  Take 1 tablet (2.5 mg total) by mouth daily.   montelukast  10 MG tablet Commonly known as: SINGULAIR  Take 1 tablet (10 mg total) by mouth at bedtime.   mupirocin ointment 2 % Commonly known as: BACTROBAN Apply 1 application topically 2 (two) times  daily.   Na Sulfate-K Sulfate-Mg Sulfate concentrate 17.5-3.13-1.6 GM/177ML Soln Commonly known as: SUPREP Use as directed; may use generic; goodrx card if insurance will not cover generic   naloxone 4 MG/0.1ML Liqd nasal spray kit Commonly known as: NARCAN Place 1 spray into the nose as needed for opioid reversal.   nitroGLYCERIN  0.4 MG SL tablet Commonly known as: NITROSTAT  Place 1 tablet (0.4 mg total) under the tongue every 5 (five) minutes as needed  for chest pain.   nystatin powder Generic drug: nystatin Apply 1 application. topically 2 (two) times daily.   Ohtuvayre  3 MG/2.5ML Susp Generic drug: Ensifentrine  Inhale 1 each into the lungs 2 (two) times daily.   omeprazole 20 MG capsule Commonly known as: PRILOSEC Take 20 mg by mouth 2 (two) times daily.   OZEMPIC (0.25 OR 0.5 MG/DOSE) [place] Inject 0.25 mg into the skin once a week.   pentoxifylline  400 MG CR tablet Commonly known as: TRENTAL  Take 400 mg by mouth 2 (two) times daily.   potassium chloride  SA 20 MEQ tablet Commonly known as: KLOR-CON  M Take 2 tablets (40 mEq total) by mouth daily.   predniSONE  10 MG tablet Commonly known as: DELTASONE  Take 4 tablets (40 mg total) by mouth daily with breakfast for 3 days, THEN 3 tablets (30 mg total) daily with breakfast for 3 days, THEN 2 tablets (20 mg total) daily with breakfast for 3 days, THEN 1 tablet (10 mg total) daily with breakfast for 3 days. Start taking on: May 25, 2024   Pseudoephedrine -Guaifenesin  [phone] MG Tb12 Take 1 tablet by mouth in the morning and at bedtime.   tamsulosin  0.4 MG Caps capsule Commonly known as: FLOMAX  Take 0.4 mg by mouth daily.   Tiotropium Bromide  18 MCG Caps Commonly known as: Spiriva  HandiHaler Place 1 puff into inhaler and inhale daily.   torsemide 10 MG tablet Commonly known as: DEMADEX Take 5-10 mg by mouth as needed for edema.        Disposition and follow-up:   Taylor Vargas was discharged from [facility] in [place] condition.  At the hospital follow up visit please address: Patient left AMA.  Unable to evaluate patient prior to discharge.  Follow-up Appointments:   Hospital Course by problem list: 1.  Acute on chronic hypoxic respiratory failure Asthma-COPD overlap syndrome OSA on NIV Obesity Documented history of HFpEF Chronic conditions: Chronic sinusitis, GERD, hyperlipidemia, osteoarthritis. Patient was admitted for ongoing shortness  of breath that had worsened in the past few days.  He was seen by his pulmonologist on request who evaluated him and provided with recommendations.  We wanted to treat the patient with breathing treatments and steroid tapering.  RT was consulted to make sure his NIV machine was working properly. However, patient left AMA, so treatment and recommendations could not be completed.  Discharge Exam:   BP 128/73   Pulse 99   Temp 98.1 F (36.7 C) (Oral)   Resp 20   Ht 6' (1.829 m)   Wt (!) 138.8 kg   SpO2 96%   BMI 41.50 kg/m  Discharge exam: Was not performed as patient left AMA  Pertinent Labs, Studies, and Procedures:  On admission: CBC: 15.8 Calcitonin: Less than 0.1  CXR: No acute cardiopulmonary findings; blunting of left costophrenic angle likely due to prominent cardiac fat pad which is unchanged from prior study.   CTA: No pulmonary embolism; ascending aorta is dilated measuring 4.1 cm which is unchanged; mild pulmonary emphysema  Discharge Instructions: Unable to provide discharge instructions as patient left before being seen by us .  Signed: Edgardo Pontiff, DO 05/31/2024, 3:27 PM   Pager: @[email or web address] @

## 2024-06-05 ENCOUNTER — Ambulatory Visit: Admitting: Pulmonary Disease

## 2024-06-05 ENCOUNTER — Encounter: Payer: Self-pay | Admitting: Pulmonary Disease

## 2024-06-05 VITALS — BP 130/86 | HR 111 | Ht 72.0 in | Wt 306.0 lb

## 2024-06-05 DIAGNOSIS — G4733 Obstructive sleep apnea (adult) (pediatric): Secondary | ICD-10-CM | POA: Diagnosis not present

## 2024-06-05 DIAGNOSIS — J9611 Chronic respiratory failure with hypoxia: Secondary | ICD-10-CM | POA: Diagnosis not present

## 2024-06-05 DIAGNOSIS — Z87891 Personal history of nicotine dependence: Secondary | ICD-10-CM

## 2024-06-05 DIAGNOSIS — J4489 Other specified chronic obstructive pulmonary disease: Secondary | ICD-10-CM

## 2024-06-05 DIAGNOSIS — J9612 Chronic respiratory failure with hypercapnia: Secondary | ICD-10-CM

## 2024-06-05 MED ORDER — PREDNISONE 10 MG PO TABS: ORAL_TABLET | ORAL | 0 refills | Status: AC

## 2024-06-05 NOTE — Assessment & Plan Note (Signed)
 Taylor Vargas

## 2024-06-05 NOTE — Patient Instructions (Addendum)
 Complete steroid taper - 20mg  daily x 3 days - 10mg  daily x 3 days  - 5mg  daily x 3 days  Continue Ohtuvayre  nebs twice daily  Continue on dupixent  injections as scheduled   Use symbicort  2 puffs twice daily - rinse mouth out after each use   Continue spiriva  inhaler daily   Use albuterol  inhaler (Ventolin  or Pro-Air) 1-2 puffs every 4-6 hours  We will send a request to your DME company to check on your NIV machine to ensure it is functioning appropriately  Continue supplemental oxygen   We will schedule you for an echocardiogram  Follow up in 3 months, call sooner if needed
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
## 2024-06-06 ENCOUNTER — Encounter: Payer: Self-pay | Admitting: Pulmonary Disease

## 2024-06-16 ENCOUNTER — Other Ambulatory Visit: Payer: Self-pay | Admitting: Adult Health

## 2024-06-20 ENCOUNTER — Other Ambulatory Visit: Payer: Self-pay

## 2024-06-20 ENCOUNTER — Telehealth: Payer: Self-pay

## 2024-06-20 MED ORDER — AZELASTINE HCL 0.1 % NA SOLN: 1.0000 | Freq: Two times a day (BID) | NASAL | 1 refills | Status: AC

## 2024-06-20 NOTE — Telephone Encounter (Signed)
" °  Spoke with pt new Rx sent in      Copied from CRM [id number]. Topic: Clinical - Prescription Issue >> Jun 19, 2024  3:56 PM [name] wrote: Reason for CRM: Patient states him and his pharmacy has been trying to get a refill on the azelastine  (ASTELIN ) 0.1 % nasal spray since 1/1 and is completely out of the medication and is kindly requesting Tammy or Dr. Kara fill this for him today as he really needs it. "

## 2024-06-20 NOTE — Telephone Encounter (Signed)
 Received faxed refill request from [facility] for Azelastine . Per phone encounter from 06/20/24, this has already been sent in. NFN.

## 2024-06-22 ENCOUNTER — Other Ambulatory Visit: Payer: Self-pay

## 2024-06-22 NOTE — Progress Notes (Signed)
 Specialty Pharmacy Refill Coordination Note  RYAN OGBORN is a 64 y.o. male contacted today regarding refills of specialty medication(s) Dupilumab  (Dupixent )   Patient requested Delivery   Delivery date: 06/27/24   Verified address: [address]  [place]   Medication will be filled on: 06/26/24

## 2024-06-26 ENCOUNTER — Other Ambulatory Visit: Payer: Self-pay

## 2024-07-05 ENCOUNTER — Ambulatory Visit: Attending: Cardiology

## 2024-07-05 DIAGNOSIS — J4489 Other specified chronic obstructive pulmonary disease: Secondary | ICD-10-CM

## 2024-07-05 LAB — ECHOCARDIOGRAM COMPLETE
AR max vel: 2.84 cm2
AV Area VTI: 2.91 cm2
AV Area mean vel: 2.81 cm2
AV Mean grad: 5 mmHg
AV Peak grad: 9.6 mmHg
Ao pk vel: 1.55 m/s
Area-P 1/2: 4.41 cm2
MV VTI: 2.83 cm2
S' Lateral: 3.9 cm

## 2024-07-10 ENCOUNTER — Ambulatory Visit: Payer: Self-pay | Admitting: Pulmonary Disease

## 2024-07-10 NOTE — Telephone Encounter (Signed)
 FYI Only or Action Required?: Action required by provider: clinical question for provider and lab or test result follow-up needed.  Patient is followed in Pulmonology for COPD, last seen on 06/05/2024 by Kara Dorn NOVAK, MD.  Called Nurse Triage reporting Shortness of Breath.  Symptoms began ongoing.  Interventions attempted: Prescription medications: prednisone , Rescue inhaler, Maintenance inhaler, Nebulizer treatments, Home oxygen  use, and Other: NIV.  Symptoms are: gradually worsening.  Triage Disposition: Call EMS [phone] Now  Patient/caregiver understands and will follow disposition?: No, refuses disposition

## 2024-07-10 NOTE — Telephone Encounter (Signed)
 Reason for Triage: pt had an echo on 07/05/24 showing no problems, pt is still having severe difficulty breathing, reports dyspnea w/ exertion, coughing up mostly clear seldom a darker color - pt has been taking prednisone  that has been helping a bit, pt is using inhalers and nebulizers more than he should be - he is requesting assistance w/ next steps  Reason for Disposition  SEVERE difficulty breathing (e.g., struggling for each breath, speaks in single words)  Answer Assessment - Initial Assessment Questions Pt states he has been getting worse for the past three months and Dr. Kara is very aware of the situation. Pt states that he had testing completed on the 21st and he knows a copy was sent to Dr. Kara and that he has had plenty of time to read it and let him know what the next steps are. Pt states prednisone  is the only thing keeping him alive currently. Is using nebulizers more than he is supposed to states- up to 5 times a day and using inhalers in between. He states the only way that he doesn't have to do that is if he is on his noninvasive ventilator or oxygen . He states that if he is sitting still he is ok at 3L but if he moves at all, there isn't a high enough setting that can help him. He states if he goes 5-6 steps to the bathroom he is down to 50% and is so exhausted and out of breath. Pt states prior to calling he was 88%. RN recommended ER. Pt cut RN off and stated he will not be going and he just wants to hear back from Dr. Kara. RN did advise pt that the office is closed today and pt got upset and stated you could have told me that from the beginning and I could have saved my breath. RN advised had to find out for appropriate recs (he continued to cut off RN- stating why did Rn need to know that information because RN is not helping pt). He stated Dr. Kara knows what he went through last time he went to the hospital and he will not be going back. RN advised again ER. He said he  will not go. He said to notify Dr. Kara first thing in the morning that patient needs a call back immediately. RN advised message will be sent.      1. RESPIRATORY STATUS: Describe your breathing? (e.g., wheezing, shortness of breath, unable to speak, severe coughing)      Shortness of breath 2. ONSET: When did this breathing problem begin?      Ongoing x 3 months.  3. PATTERN Does the difficult breathing come and go, or has it been constant since it started?      constant 4. SEVERITY: How bad is your breathing? (e.g., mild, moderate, severe)      severe 5. RECURRENT SYMPTOM: Have you had difficulty breathing before? If Yes, ask: When was the last time? and What happened that time?      yes 6. CARDIAC HISTORY: Do you have any history of heart disease? (e.g., heart attack, angina, bypass surgery, angioplasty)       7. LUNG HISTORY: Do you have any history of lung disease?  (e.g., pulmonary embolus, asthma, emphysema)     Copd- resp failure 8. CAUSE: What do you think is causing the breathing problem?       9. OTHER SYMPTOMS: Do you have any other symptoms? (e.g., chest pain, cough, dizziness, fever,  runny nose)      10. O2 SATURATION MONITOR:  Do you use an oxygen  saturation monitor (pulse oximeter) at home? If Yes, ask: What is your reading (oxygen  level) today? What is your usual oxygen  saturation reading? (e.g., 95%)       88  Protocols used: Breathing Difficulty-A-AH

## 2024-07-11 ENCOUNTER — Telehealth (HOSPITAL_COMMUNITY): Payer: Self-pay

## 2024-07-11 ENCOUNTER — Ambulatory Visit: Payer: Self-pay | Admitting: Pulmonary Disease

## 2024-07-11 ENCOUNTER — Other Ambulatory Visit (HOSPITAL_COMMUNITY): Payer: Self-pay

## 2024-07-11 DIAGNOSIS — I5032 Chronic diastolic (congestive) heart failure: Secondary | ICD-10-CM

## 2024-07-11 NOTE — Telephone Encounter (Signed)
 Please let patient know that I have reached out to his cardiology team regarding evaluation for pulmonary hypertension by having a right heart cath done.   I am concerned like before that his breathing has not improved with standard COPD management with steroids and antibiotics along with nebulizer/inhaler therapies.   I do not have other treatment recommendations at this time until the heart cath is completed. I do recommend he go to the hospital if he is really having trouble with his breathing.  Thanks, Dorn Chill, MD [facility] Office: [phone]

## 2024-07-11 NOTE — Telephone Encounter (Signed)
 Patient called and [facility] scheduled for Friday 07/21/24 at 10.30 am with Dr. Cherrie. Instructions given to patient over the phone and also sent via my chart. Pa request sent to Pulmonology for PA.

## 2024-07-11 NOTE — Telephone Encounter (Signed)
 Spoke with patient Taylor Vargas, has appointment setup with heart team

## 2024-07-14 ENCOUNTER — Other Ambulatory Visit: Payer: Self-pay

## 2024-07-14 NOTE — Addendum Note (Signed)
 Addended by: SHAREN DELON HERO on: 07/14/2024 12:17 PM   Modules accepted: Orders

## 2024-07-18 ENCOUNTER — Other Ambulatory Visit: Payer: Self-pay

## 2024-07-18 ENCOUNTER — Ambulatory Visit

## 2024-07-18 ENCOUNTER — Other Ambulatory Visit (HOSPITAL_COMMUNITY): Payer: Self-pay

## 2024-07-18 DIAGNOSIS — J4489 Other specified chronic obstructive pulmonary disease: Secondary | ICD-10-CM

## 2024-07-18 DIAGNOSIS — Z79899 Other long term (current) drug therapy: Secondary | ICD-10-CM

## 2024-07-18 MED ORDER — DUPIXENT 300 MG/2ML ~~LOC~~ SOAJ
300.0000 mg | SUBCUTANEOUS | 3 refills | Status: AC
  Filled 2024-07-18: qty 4, 28d supply, fill #0

## 2024-07-18 NOTE — Progress Notes (Signed)
 Specialty Pharmacy Ongoing Clinical Assessment Note  Taylor Vargas is a 64 y.o. male who is being followed by the specialty pharmacy service for RxSp Asthma/COPD   Patient's specialty medication(s) reviewed today: Dupilumab  (Dupixent )   Missed doses in the last 4 weeks: 0   Patient/Caregiver did not have any additional questions or concerns.   Therapeutic benefit summary: Patient is achieving benefit   Adverse events/side effects summary: No adverse events/side effects   Patient's therapy is appropriate to: Continue    Goals Addressed             This Visit's Progress    Reduce disease symptoms including coughing and shortness of breath   On track    Patient is on track. Patient will maintain adherence. Patient reports struggling with his breathing but not due to Dupixent  efficacy at this time. He is scheduled for further evaluations to determine cause.          Follow up: 12 months  Taylor Vargas Specialty Pharmacist

## 2024-07-19 ENCOUNTER — Other Ambulatory Visit: Payer: Self-pay

## 2024-07-21 ENCOUNTER — Encounter (HOSPITAL_COMMUNITY)
Admission: RE | Disposition: A | Discharge status: Home / Self Care | Payer: Self-pay | Source: Home / Self Care | Attending: Internal Medicine

## 2024-07-21 ENCOUNTER — Other Ambulatory Visit: Payer: Self-pay

## 2024-07-21 ENCOUNTER — Ambulatory Visit (HOSPITAL_COMMUNITY): Admission: RE | Admit: 2024-07-21 | Admitting: Internal Medicine

## 2024-07-21 DIAGNOSIS — I272 Pulmonary hypertension, unspecified: Secondary | ICD-10-CM

## 2024-07-21 DIAGNOSIS — I5032 Chronic diastolic (congestive) heart failure: Secondary | ICD-10-CM

## 2024-07-21 LAB — POCT I-STAT EG7
Acid-Base Excess: 6 mmol/L — ABNORMAL HIGH (ref 0.0–2.0)
Acid-Base Excess: 6 mmol/L — ABNORMAL HIGH (ref 0.0–2.0)
Bicarbonate: 32.1 mmol/L — ABNORMAL HIGH (ref 20.0–28.0)
Bicarbonate: 32.3 mmol/L — ABNORMAL HIGH (ref 20.0–28.0)
Calcium, Ion: 1.12 mmol/L — ABNORMAL LOW (ref 1.15–1.40)
Calcium, Ion: 1.14 mmol/L — ABNORMAL LOW (ref 1.15–1.40)
HCT: 40 % (ref 39.0–52.0)
HCT: 41 % (ref 39.0–52.0)
Hemoglobin: 13.6 g/dL (ref 13.0–17.0)
Hemoglobin: 13.9 g/dL (ref 13.0–17.0)
O2 Saturation: 62 %
O2 Saturation: 69 %
Potassium: 3.3 mmol/L — ABNORMAL LOW (ref 3.5–5.1)
Potassium: 3.4 mmol/L — ABNORMAL LOW (ref 3.5–5.1)
Sodium: 141 mmol/L (ref 135–145)
Sodium: 141 mmol/L (ref 135–145)
TCO2: 34 mmol/L — ABNORMAL HIGH (ref 22–32)
TCO2: 34 mmol/L — ABNORMAL HIGH (ref 22–32)
pCO2, Ven: 53.2 mmHg (ref 44–60)
pCO2, Ven: 53.6 mmHg (ref 44–60)
pH, Ven: 7.388 (ref 7.25–7.43)
pH, Ven: 7.388 (ref 7.25–7.43)
pO2, Ven: 33 mmHg (ref 32–45)
pO2, Ven: 37 mmHg (ref 32–45)

## 2024-07-21 LAB — CBC
HCT: 41.1 % (ref 39.0–52.0)
Hemoglobin: 12.4 g/dL — ABNORMAL LOW (ref 13.0–17.0)
MCH: 25.9 pg — ABNORMAL LOW (ref 26.0–34.0)
MCHC: 30.2 g/dL (ref 30.0–36.0)
MCV: 85.8 fL (ref 80.0–100.0)
Platelets: 288 10*3/uL (ref 150–400)
RBC: 4.79 MIL/uL (ref 4.22–5.81)
RDW: 17.5 % — ABNORMAL HIGH (ref 11.5–15.5)
WBC: 12.6 10*3/uL — ABNORMAL HIGH (ref 4.0–10.5)
nRBC: 0 % (ref 0.0–0.2)

## 2024-07-21 LAB — POCT I-STAT 7, (LYTES, BLD GAS, ICA,H+H)
Acid-Base Excess: 5 mmol/L — ABNORMAL HIGH (ref 0.0–2.0)
Bicarbonate: 31.4 mmol/L — ABNORMAL HIGH (ref 20.0–28.0)
Calcium, Ion: 1.09 mmol/L — ABNORMAL LOW (ref 1.15–1.40)
HCT: 39 % (ref 39.0–52.0)
Hemoglobin: 13.3 g/dL (ref 13.0–17.0)
O2 Saturation: 96 %
Potassium: 3.2 mmol/L — ABNORMAL LOW (ref 3.5–5.1)
Sodium: 141 mmol/L (ref 135–145)
TCO2: 33 mmol/L — ABNORMAL HIGH (ref 22–32)
pCO2 arterial: 51.2 mmHg — ABNORMAL HIGH (ref 32–48)
pH, Arterial: 7.395 (ref 7.35–7.45)
pO2, Arterial: 87 mmHg (ref 83–108)

## 2024-07-21 LAB — POCT I-STAT, CHEM 8
BUN: 20 mg/dL (ref 8–23)
Calcium, Ion: 1.23 mmol/L (ref 1.15–1.40)
Chloride: 93 mmol/L — ABNORMAL LOW (ref 98–111)
Creatinine, Ser: 1.3 mg/dL — ABNORMAL HIGH (ref 0.61–1.24)
Glucose, Bld: 125 mg/dL — ABNORMAL HIGH (ref 70–99)
HCT: 40 % (ref 39.0–52.0)
Hemoglobin: 13.6 g/dL (ref 13.0–17.0)
Potassium: 3.5 mmol/L (ref 3.5–5.1)
Sodium: 139 mmol/L (ref 135–145)
TCO2: 31 mmol/L (ref 22–32)

## 2024-07-21 LAB — BASIC METABOLIC PANEL WITH GFR
Anion gap: 10 (ref 5–15)
BUN: 19 mg/dL (ref 8–23)
CO2: 31 mmol/L (ref 22–32)
Calcium: 9 mg/dL (ref 8.9–10.3)
Chloride: 97 mmol/L — ABNORMAL LOW (ref 98–111)
Creatinine, Ser: 1.3 mg/dL — ABNORMAL HIGH (ref 0.61–1.24)
GFR, Estimated: >60 mL/min
Glucose, Bld: 129 mg/dL — ABNORMAL HIGH (ref 70–99)
Potassium: 3.6 mmol/L (ref 3.5–5.1)
Sodium: 138 mmol/L (ref 135–145)

## 2024-07-21 MED ORDER — SODIUM CHLORIDE 0.9 % IV SOLN: 250.0000 mL | INTRAVENOUS | Status: DC | PRN

## 2024-07-21 MED ORDER — SODIUM CHLORIDE 0.9% FLUSH: 3.0000 mL | Freq: Two times a day (BID) | INTRAVENOUS | Status: DC

## 2024-07-21 MED ORDER — FREE WATER: 250.0000 mL | Freq: Once | Status: DC

## 2024-07-21 MED ORDER — LIDOCAINE HCL (PF) 1 % IJ SOLN
INTRAMUSCULAR | Status: DC | PRN
  Administered 2024-07-21 (×2): 2 mL

## 2024-07-21 MED ORDER — SODIUM CHLORIDE 0.9% FLUSH: 3.0000 mL | INTRAVENOUS | Status: DC | PRN

## 2024-07-21 MED ORDER — HYDRALAZINE HCL 20 MG/ML IJ SOLN: 10.0000 mg | INTRAMUSCULAR | Status: DC | PRN

## 2024-07-21 MED ORDER — ACETAMINOPHEN 325 MG PO TABS: 650.0000 mg | ORAL_TABLET | ORAL | Status: DC | PRN

## 2024-07-21 MED ORDER — HEPARIN SODIUM (PORCINE) 1000 UNIT/ML IJ SOLN
INTRAMUSCULAR | Status: DC | PRN
  Administered 2024-07-21: 6000 [IU] via INTRAVENOUS

## 2024-07-21 MED ORDER — VERAPAMIL HCL 2.5 MG/ML IV SOLN
INTRAVENOUS | Status: AC
  Filled 2024-07-21: qty 2

## 2024-07-21 MED ORDER — VERAPAMIL HCL 2.5 MG/ML IV SOLN
INTRAVENOUS | Status: DC | PRN
  Administered 2024-07-21: 10 mL via INTRA_ARTERIAL

## 2024-07-21 MED ORDER — HEPARIN (PORCINE) IN NACL 1000-0.9 UT/500ML-% IV SOLN
INTRAVENOUS | Status: DC | PRN
  Administered 2024-07-21 (×2): 500 mL

## 2024-07-21 MED ORDER — LIDOCAINE HCL (PF) 1 % IJ SOLN
INTRAMUSCULAR | Status: AC
  Filled 2024-07-21: qty 30

## 2024-07-21 MED ORDER — IOHEXOL 350 MG/ML SOLN
INTRAVENOUS | Status: DC | PRN
  Administered 2024-07-21: 45 mL via INTRA_ARTERIAL

## 2024-07-21 MED ORDER — ONDANSETRON HCL 4 MG/2ML IJ SOLN: 4.0000 mg | Freq: Four times a day (QID) | INTRAMUSCULAR | Status: DC | PRN

## 2024-07-21 NOTE — H&P (Signed)
 "  ADVANCED HF CLINIC CONSULT NOTE  Referring Physician: Street, Lonni HERO, MD Primary Care: Street, Lonni HERO, MD Primary Cardiologist: None  Chief Complaint: Dyspnea  HPI:  64 y/o male with morbid obesity, pancreatic CA s/p treatment, COPD, OSA referred by Dr. Kara in Pulmonary for RHC to further evaluate dyspnea.   Says over the past 3 months his dyspnea has progressed dramatically. Now gets SOB after taking 2-3 steps. Also has episodes of right sided CP which are worse with bending and movement. He is worried he has blockages.   Uses non-invasive ventilator at night  Recent CTA mild COPD. + coronary calcium    Echo 1/26 EF 60% RV normal no sig TR Personally reviewed    Past Medical History:  Diagnosis Date   Abnormal glucose 08/27/2015   Last Assessment & Plan:  Relevant Hx: Course: Daily Update: Today's Plan:will get an a1c especially given his increased urinary frequency  Electronically signed by: [place] Dariel Sayres, FNP 08/27/15 1513   Arthritis    Benign prostatic hyperplasia with lower urinary tract symptoms 08/27/2015   Last Assessment & Plan:  Relevant Hx: Course: Daily Update: Today's Plan:taking flomax  for this, but his was presumed, no workup for this diagnosis  Electronically signed by: [place] Dariel Sayres, FNP 08/27/15 1513   Chest tightness 11/08/2018   Chronic diastolic heart failure (HCC) 04/21/2019   Chronic obstructive pulmonary disease (HCC) 08/27/2015   Last Assessment & Plan:  Relevant Hx: Course: Daily Update: Today's Plan:this is likely in acute exacerbation. I have discussed with him that I would like to et cxr to rule out acute pathology. He finally agrees to this. His sat came up to 89% on RA He understands s/e's of the prednisone  and if he does have an element of diabetes or prediabtes this could make that worse for him  Electronically sig   Chronic pain of both knees 01/04/2020   Formatting of this note might be different from the  original. Added automatically from request for surgery [id number]   Congenital absence of one kidney    01-04-2018 per pt unsure which one   COPD (chronic obstructive pulmonary disease) (HCC)    pulmologist-  dr mardee jennye)   Coronary artery calcification seen on CAT scan 11/30/2019   GERD (gastroesophageal reflux disease)    History of colon polyps 04/26/2023   Hypercholesterolemia 09/10/2015   Hyperglycemia 09/10/2015   Malaise and fatigue 08/27/2015   Last Assessment & Plan:  Relevant Hx: Course: Daily Update: Today's Plan:will get labs to rule out acute pathology   Electronically signed by: [place] Dariel Sayres, FNP 08/27/15 1513   Morbid obesity (HCC) 11/02/2018   On supplemental oxygen  therapy    01-04-2018  per pt prescribed as needed ,  states checks oxygen  level daily,  and average between 88-90% on room air,  per pt has not needed O2 past 4 months   OSA on CPAP    Osteoarthritis of right knee 01/11/2020   Pain in left knee 12/28/2017   Pancreatic cancer (HCC)    01-04-2018  per pt dx 10/ 2009 found by CT imaging, no surgery,  treatment chemotherapy  @ [facility],  in remission since 2017   Solitary thyroid nodule 09/10/2015    Current Facility-Administered Medications  Medication Dose Route Frequency Provider Last Rate Last Admin   0.9 %  sodium chloride  infusion  250 mL Intravenous PRN Victorya Hillman, Toribio SAUNDERS, MD       free water  250 mL  250 mL Oral Once  Evi Mccomb, Toribio SAUNDERS, MD       sodium chloride  flush (NS) 0.9 % injection 3 mL  3 mL Intravenous Q12H Kailah Pennel, Toribio SAUNDERS, MD       sodium chloride  flush (NS) 0.9 % injection 3 mL  3 mL Intravenous PRN Alycen Mack, Toribio SAUNDERS, MD        Allergies[1]    Social History   Socioeconomic History   Marital status: Single    Spouse name: Not on file   Number of children: Not on file   Years of education: Not on file   Highest education level: Not on file  Occupational History   Not on file  Tobacco Use   Smoking status: Former     Current packs/day: 0.00    Average packs/day: 2.0 packs/day for 31.0 years (62.0 ttl pk-yrs)    Types: Cigarettes    Start date: 3    Quit date: 2018    Years since quitting: 8.1   Smokeless tobacco: Never  Vaping Use   Vaping status: Never Used  Substance and Sexual Activity   Alcohol use: Not Currently   Drug use: Never   Sexual activity: Not on file  Other Topics Concern   Not on file  Social History Narrative   Not on file   Social Drivers of Health   Tobacco Use: Medium Risk (06/06/2024)   Patient History    Smoking Tobacco Use: Former    Smokeless Tobacco Use: Never    Passive Exposure: Not on Actuary Strain: Not on file  Food Insecurity: Not on file  Transportation Needs: Not on file  Physical Activity: Not on file  Stress: Not on file  Social Connections: Not on file  Intimate Partner Violence: Not on file  Depression (PHQ2-9): Not on file  Alcohol Screen: Not on file  Housing: Not on file  Utilities: Not on file  Health Literacy: Not on file      Family History  Problem Relation Age of Onset   Diabetes Mother    Breast cancer Mother    Emphysema Maternal Grandfather    Colon cancer Neg Hx    Esophageal cancer Neg Hx     Vitals:   07/21/24 0914  BP: 110/78  Pulse: (!) 101  Resp: 17  Temp: 98.2 F (36.8 C)  TempSrc: Oral  SpO2: 97%  Weight: (!) 138.8 kg  Height: 6' (1.829 m)    PHYSICAL EXAM: General:  Obese male Plethoric facise. SOB at rest  HEENT: normal Neck: supple. JVP hard to see . Carotids 2+ bilat; no bruits. No lymphadenopathy or thryomegaly appreciated. Cor: PMI nondisplaced. Regular rate & rhythm. No rubs, gallops or murmurs. Lungs: coarse Abdomen: obese soft, nontender, nondistended. No hepatosplenomegaly. No bruits or masses. Good bowel sounds. Extremities: no cyanosis, clubbing, rash, edema Neuro: alert & oriented x 3, cranial nerves grossly intact. moves all 4 extremities w/o difficulty. Affect  pleasant.   ASSESSMENT & PLAN:  1. Dyspnea with chronic hypoxic respiratory failure - he has multiple RFs for pulmonary HTN and diastolic HF and CAD - echo 1/26 with normal RV - will plan RHC/LHC for definitive evaluation  2. Chest pain and coronary calcifications on CT - has CP with typical and atypical features. High risk for CAD - will plan coronary angio at time of cath  3. Morbid obesity - Body mass index is 41.5 kg/m. - needs weight loss   Toribio Fuel, MD  9:49 AM      [1]  No Known Allergies  "

## 2024-07-21 NOTE — Progress Notes (Signed)
 TR band removed at 1305, gauze dressing applied. Right radial level 0, clean, dry, and intact.

## 2024-07-21 NOTE — Interval H&P Note (Signed)
 History and Physical Interval Note:  07/21/2024 10:31 AM  Taylor Vargas  has presented today for surgery, with the diagnosis of dyspnea and CP  The various methods of treatment have been discussed with the patient and family. After consideration of risks, benefits and other options for treatment, the patient has consented to  Procedures: RIGHT/LEFT HEART CATH AND CORONARY ANGIOGRAPHY (N/A) as a surgical intervention.  The patient's history has been reviewed, patient examined, no change in status, stable for surgery.  I have reviewed the patient's chart and labs.  Questions were answered to the patient's satisfaction.     Riaan Toledo

## 2024-07-21 NOTE — Discharge Instructions (Signed)

## 2024-09-11 ENCOUNTER — Ambulatory Visit: Admitting: Pulmonary Disease
